# Patient Record
Sex: Male | Born: 1975 | Race: White | Hispanic: No | Smoking: Current every day smoker
Health system: Southern US, Community
[De-identification: ages and names within clinical notes are randomized; demographics above are authoritative.]

## PROBLEM LIST (undated history)

## (undated) DIAGNOSIS — S069XAA Unspecified intracranial injury with loss of consciousness status unknown, initial encounter: Secondary | ICD-10-CM

## (undated) DIAGNOSIS — E785 Hyperlipidemia, unspecified: Secondary | ICD-10-CM

## (undated) DIAGNOSIS — H9190 Unspecified hearing loss, unspecified ear: Secondary | ICD-10-CM

## (undated) DIAGNOSIS — E871 Hypo-osmolality and hyponatremia: Secondary | ICD-10-CM

## (undated) DIAGNOSIS — J449 Chronic obstructive pulmonary disease, unspecified: Secondary | ICD-10-CM

## (undated) HISTORY — DX: Hyperlipidemia, unspecified: E78.5

## (undated) HISTORY — DX: Unspecified intracranial injury with loss of consciousness status unknown, initial encounter: S06.9XAA

## (undated) HISTORY — DX: Hypo-osmolality and hyponatremia: E87.1

## (undated) HISTORY — DX: Unspecified hearing loss, unspecified ear: H91.90

## (undated) HISTORY — PX: DENTAL SURGERY: SHX609

---

## 1997-10-13 DIAGNOSIS — S069XAA Unspecified intracranial injury with loss of consciousness status unknown, initial encounter: Secondary | ICD-10-CM

## 1997-10-13 HISTORY — DX: Unspecified intracranial injury with loss of consciousness status unknown, initial encounter: S06.9XAA

## 2015-09-07 ENCOUNTER — Emergency Department (HOSPITAL_COMMUNITY)
Admission: EM | Admit: 2015-09-07 | Discharge: 2015-09-08 | Discharge status: Against Medical Advice | Payer: Self-pay | Attending: Emergency Medicine | Admitting: Emergency Medicine

## 2015-09-07 ENCOUNTER — Encounter (HOSPITAL_COMMUNITY): Payer: Self-pay | Admitting: Emergency Medicine

## 2015-09-07 ENCOUNTER — Emergency Department (HOSPITAL_COMMUNITY): Payer: Self-pay

## 2015-09-07 DIAGNOSIS — J449 Chronic obstructive pulmonary disease, unspecified: Secondary | ICD-10-CM | POA: Insufficient documentation

## 2015-09-07 DIAGNOSIS — F17200 Nicotine dependence, unspecified, uncomplicated: Secondary | ICD-10-CM | POA: Insufficient documentation

## 2015-09-07 DIAGNOSIS — R079 Chest pain, unspecified: Secondary | ICD-10-CM | POA: Insufficient documentation

## 2015-09-07 HISTORY — DX: Chronic obstructive pulmonary disease, unspecified: J44.9

## 2015-09-07 LAB — CBC
HCT: 42.4 % (ref 39.0–52.0)
HEMOGLOBIN: 15.7 g/dL (ref 13.0–17.0)
MCH: 33.5 pg (ref 26.0–34.0)
MCHC: 37 g/dL — AB (ref 30.0–36.0)
MCV: 90.4 fL (ref 78.0–100.0)
PLATELETS: 214 10*3/uL (ref 150–400)
RBC: 4.69 MIL/uL (ref 4.22–5.81)
RDW: 12.4 % (ref 11.5–15.5)
WBC: 9.2 10*3/uL (ref 4.0–10.5)

## 2015-09-07 LAB — BASIC METABOLIC PANEL
Anion gap: 10 (ref 5–15)
BUN: 12 mg/dL (ref 6–20)
CALCIUM: 9.4 mg/dL (ref 8.9–10.3)
CO2: 23 mmol/L (ref 22–32)
CREATININE: 1 mg/dL (ref 0.61–1.24)
Chloride: 102 mmol/L (ref 101–111)
GFR calc Af Amer: >60 mL/min (ref >60)
GFR calc non Af Amer: >60 mL/min (ref >60)
GLUCOSE: 124 mg/dL — AB (ref 65–99)
Potassium: 3.8 mmol/L (ref 3.5–5.1)
Sodium: 135 mmol/L (ref 135–145)

## 2015-09-07 LAB — I-STAT TROPONIN, ED: TROPONIN I, POC: 0 ng/mL (ref 0.00–0.08)

## 2015-09-07 NOTE — ED Notes (Signed)
Patient states that about 3 hours ago he started to have mid to right chest pain. The patient reports that he has an "annoying " pain to his chest and is more concerned because he felt like his chest is beating fast.

## 2015-09-08 NOTE — ED Notes (Signed)
Patient called to treatment room  X2, no answer. Patient d/c LWBS at this time.

## 2015-09-08 NOTE — ED Notes (Signed)
Patient called to treatment room, no answer x1.  

## 2016-07-10 IMAGING — CR DG CHEST 2V
2 series · 2 of 2 positions shown · non-contrast
Comparison: None.

CLINICAL DATA: Shortness of breath, weakness, tachycardia, and
dizziness for 1 day. Smoker.

EXAM:
CHEST  2 VIEW

[w chest pa]
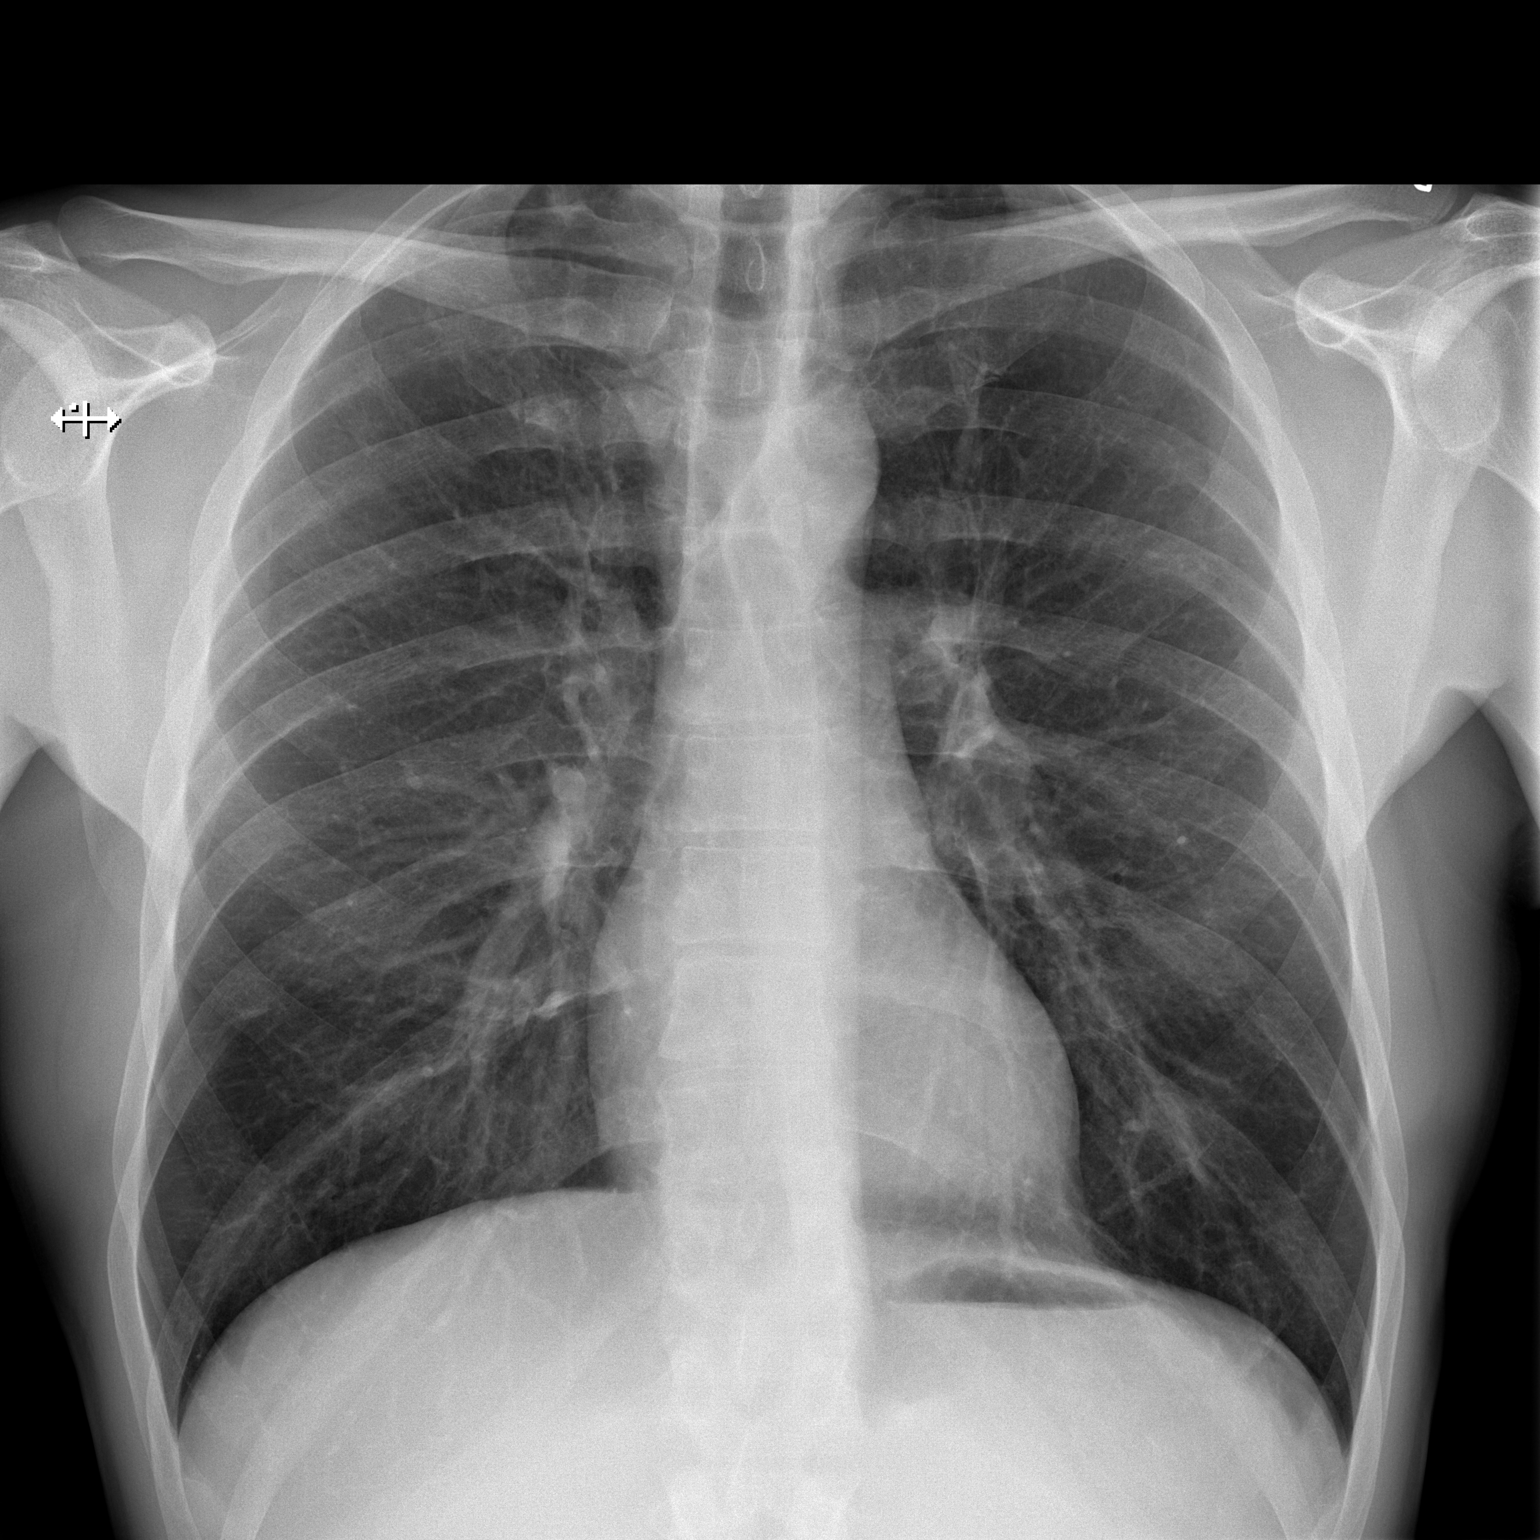

[w chest lat]
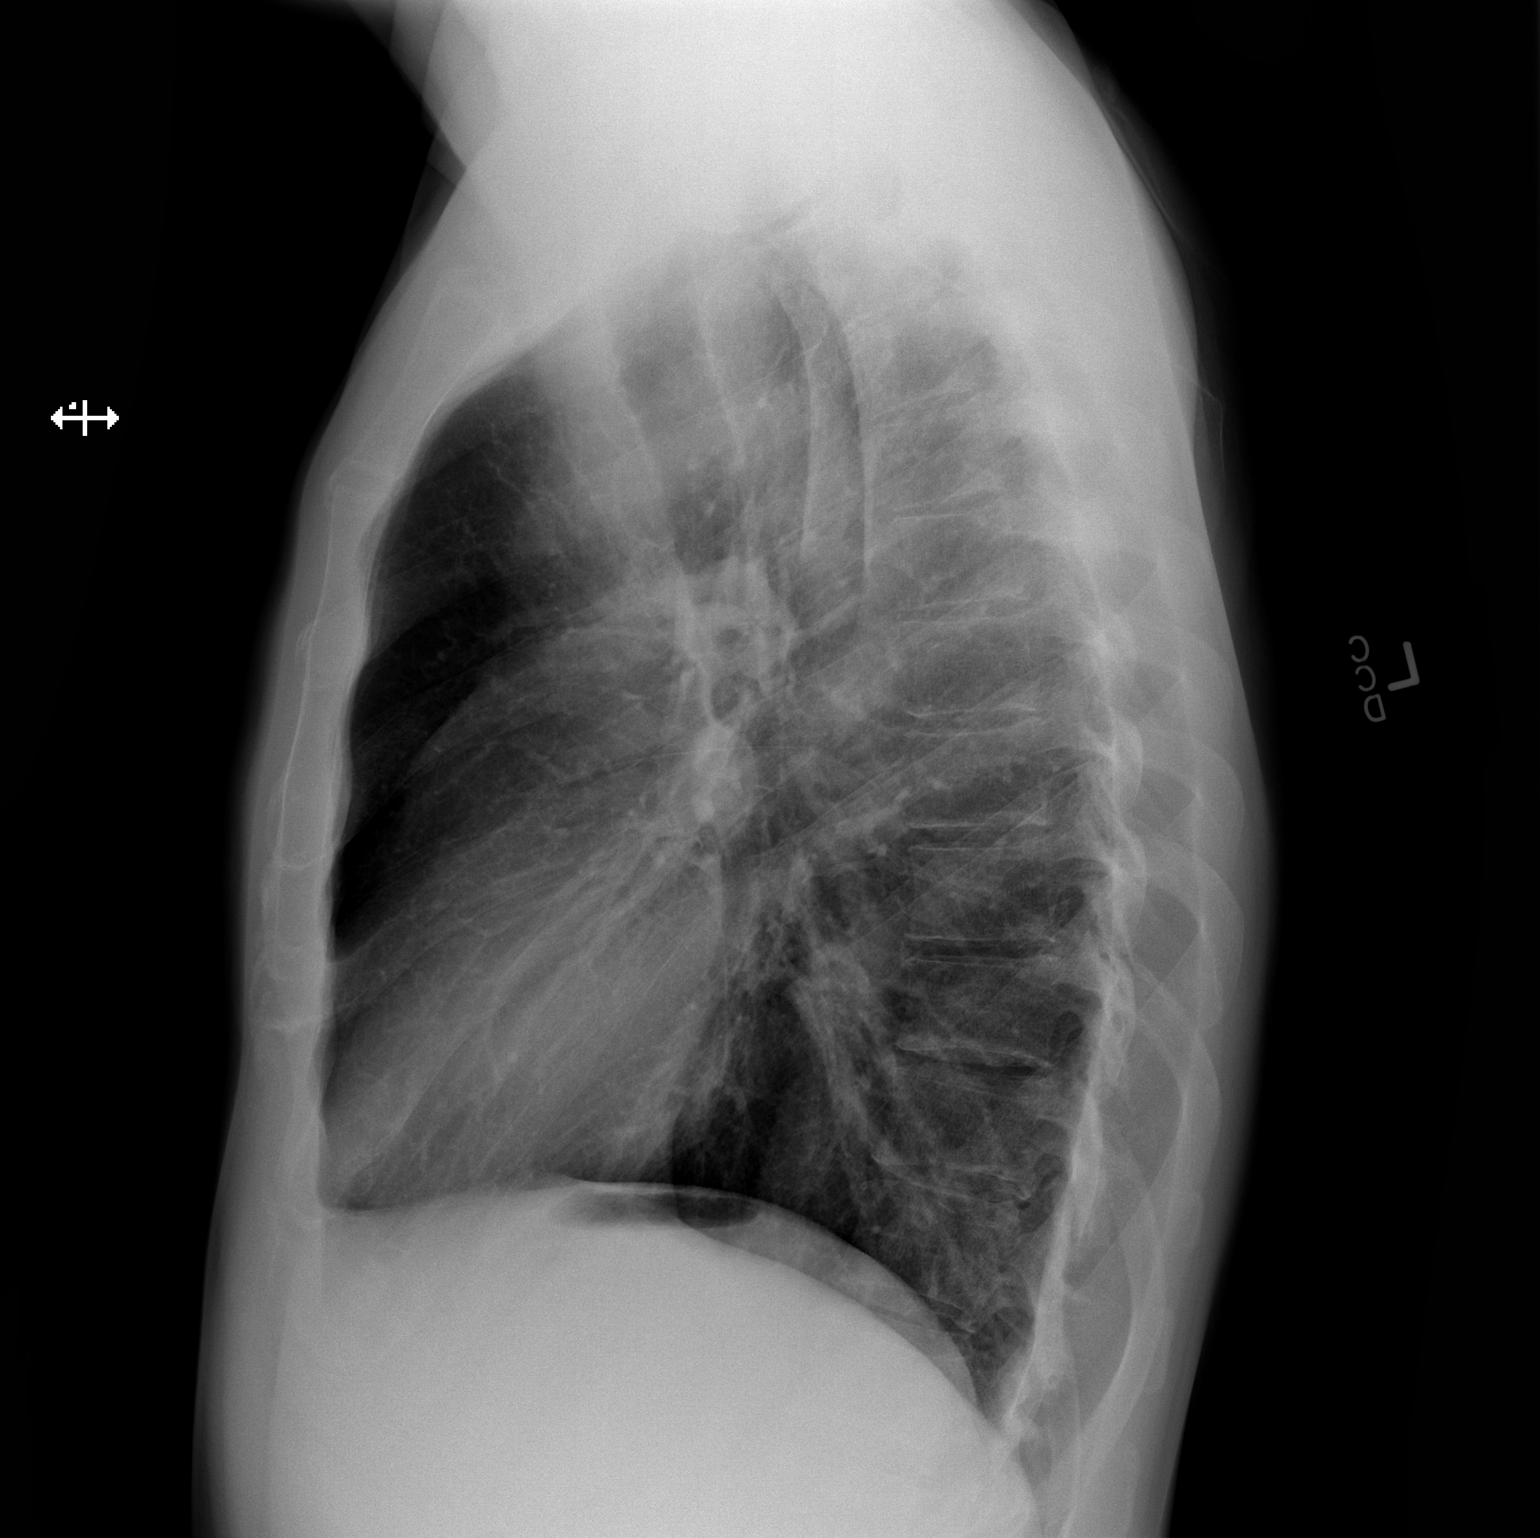

[2 of 2 positions shown; findings below may reference images not displayed]

FINDINGS: Mild hyperinflation. Normal heart size and pulmonary vascularity. No
focal airspace disease or consolidation in the lungs. No blunting of
costophrenic angles. No pneumothorax. Mediastinal contours appear
intact.
IMPRESSION: No active cardiopulmonary disease.

## 2021-09-12 DIAGNOSIS — U071 COVID-19: Secondary | ICD-10-CM

## 2021-09-12 HISTORY — DX: COVID-19: U07.1

## 2021-10-04 ENCOUNTER — Other Ambulatory Visit: Payer: Self-pay

## 2021-10-04 ENCOUNTER — Emergency Department: Payer: BC Managed Care – PPO

## 2021-10-04 ENCOUNTER — Observation Stay
Admission: EM | Admit: 2021-10-04 | Discharge: 2021-10-05 | Disposition: A | Discharge status: Home / Self Care | Payer: BC Managed Care – PPO | Attending: Student in an Organized Health Care Education/Training Program | Admitting: Student in an Organized Health Care Education/Training Program

## 2021-10-04 DIAGNOSIS — R9431 Abnormal electrocardiogram [ECG] [EKG]: Secondary | ICD-10-CM | POA: Insufficient documentation

## 2021-10-04 DIAGNOSIS — Z8249 Family history of ischemic heart disease and other diseases of the circulatory system: Secondary | ICD-10-CM | POA: Insufficient documentation

## 2021-10-04 DIAGNOSIS — Z79899 Other long term (current) drug therapy: Secondary | ICD-10-CM | POA: Insufficient documentation

## 2021-10-04 DIAGNOSIS — I259 Chronic ischemic heart disease, unspecified: Secondary | ICD-10-CM

## 2021-10-04 DIAGNOSIS — I2510 Atherosclerotic heart disease of native coronary artery without angina pectoris: Secondary | ICD-10-CM | POA: Insufficient documentation

## 2021-10-04 DIAGNOSIS — E871 Hypo-osmolality and hyponatremia: Secondary | ICD-10-CM | POA: Insufficient documentation

## 2021-10-04 DIAGNOSIS — Z7982 Long term (current) use of aspirin: Secondary | ICD-10-CM | POA: Insufficient documentation

## 2021-10-04 DIAGNOSIS — F17210 Nicotine dependence, cigarettes, uncomplicated: Secondary | ICD-10-CM | POA: Insufficient documentation

## 2021-10-04 DIAGNOSIS — R072 Precordial pain: Principal | ICD-10-CM | POA: Insufficient documentation

## 2021-10-04 DIAGNOSIS — R079 Chest pain, unspecified: Secondary | ICD-10-CM | POA: Diagnosis present

## 2021-10-04 DIAGNOSIS — U071 COVID-19: Secondary | ICD-10-CM | POA: Insufficient documentation

## 2021-10-04 LAB — CBC AND DIFFERENTIAL
Absolute NRBC: 0 10*3/uL (ref 0.00–0.00)
Basophils Absolute Automated: 0.07 10*3/uL (ref 0.00–0.08)
Basophils Automated: 1.1 %
Eosinophils Absolute Automated: 0.09 10*3/uL (ref 0.00–0.44)
Eosinophils Automated: 1.4 %
Hematocrit: 37.8 % (ref 37.6–49.6)
Hgb: 13.8 g/dL (ref 12.5–17.1)
Immature Granulocytes Absolute: 0.02 10*3/uL (ref 0.00–0.07)
Immature Granulocytes: 0.3 %
Lymphocytes Absolute Automated: 0.71 10*3/uL (ref 0.42–3.22)
Lymphocytes Automated: 11.4 %
MCH: 32.3 pg (ref 25.1–33.5)
MCHC: 36.5 g/dL — ABNORMAL HIGH (ref 31.5–35.8)
MCV: 88.5 fL (ref 78.0–96.0)
MPV: 8.4 fL — ABNORMAL LOW (ref 8.9–12.5)
Monocytes Absolute Automated: 0.54 10*3/uL (ref 0.21–0.85)
Monocytes: 8.6 %
Neutrophils Absolute: 4.82 10*3/uL (ref 1.10–6.33)
Neutrophils: 77.2 %
Nucleated RBC: 0 /100 WBC (ref 0.0–0.0)
Platelets: 183 10*3/uL (ref 142–346)
RBC: 4.27 10*6/uL (ref 4.20–5.90)
RDW: 12 % (ref 11–15)
WBC: 6.25 10*3/uL (ref 3.10–9.50)

## 2021-10-04 LAB — HIGH SENSITIVITY TROPONIN-I WITH DELTA
hs Troponin-I Delta: UNDETERMINED ng/L
hs Troponin-I: <2.7 ng/L

## 2021-10-04 LAB — ECG 12-LEAD
Atrial Rate: 80 {beats}/min
IHS MUSE NARRATIVE AND IMPRESSION: NORMAL
P Axis: 28 degrees
P-R Interval: 142 ms
Q-T Interval: 380 ms
QRS Duration: 110 ms
QTC Calculation (Bezet): 438 ms
R Axis: 73 degrees
T Axis: -16 degrees
Ventricular Rate: 80 {beats}/min

## 2021-10-04 LAB — COVID-19 (SARS-COV-2) & INFLUENZA  A/B, NAA (ROCHE LIAT)
Influenza A: NOT DETECTED
Influenza B: NOT DETECTED
SARS CoV 2 Overall Result: DETECTED — AB

## 2021-10-04 LAB — COMPREHENSIVE METABOLIC PANEL
ALT: 13 U/L (ref 0–55)
AST (SGOT): 19 U/L (ref 5–41)
Albumin/Globulin Ratio: 1.3 (ref 0.9–2.2)
Albumin: 4 g/dL (ref 3.5–5.0)
Alkaline Phosphatase: 61 U/L (ref 37–117)
Anion Gap: 12 (ref 5.0–15.0)
BUN: 5 mg/dL — ABNORMAL LOW (ref 9.0–28.0)
Bilirubin, Total: 0.4 mg/dL (ref 0.2–1.2)
CO2: 22 mEq/L (ref 17–29)
Calcium: 9 mg/dL (ref 8.5–10.5)
Chloride: 90 mEq/L — ABNORMAL LOW (ref 99–111)
Creatinine: 0.9 mg/dL (ref 0.5–1.5)
Globulin: 3 g/dL (ref 2.0–3.6)
Glucose: 86 mg/dL (ref 70–100)
Potassium: 4.3 mEq/L (ref 3.5–5.3)
Protein, Total: 7 g/dL (ref 6.0–8.3)
Sodium: 124 mEq/L — ABNORMAL LOW (ref 135–145)

## 2021-10-04 LAB — CBC
Absolute NRBC: 0 10*3/uL (ref 0.00–0.00)
Hematocrit: 34.8 % — ABNORMAL LOW (ref 37.6–49.6)
Hgb: 12.8 g/dL (ref 12.5–17.1)
MCH: 32.2 pg (ref 25.1–33.5)
MCHC: 36.8 g/dL — ABNORMAL HIGH (ref 31.5–35.8)
MCV: 87.7 fL (ref 78.0–96.0)
MPV: 8.4 fL — ABNORMAL LOW (ref 8.9–12.5)
Nucleated RBC: 0 /100 WBC (ref 0.0–0.0)
Platelets: 172 10*3/uL (ref 142–346)
RBC: 3.97 10*6/uL — ABNORMAL LOW (ref 4.20–5.90)
RDW: 12 % (ref 11–15)
WBC: 5.92 10*3/uL (ref 3.10–9.50)

## 2021-10-04 LAB — ANTI-XA,UFH
Anti-Xa, UFH: 0.09 IU/mL
Anti-Xa, UFH: 0.32 IU/mL

## 2021-10-04 LAB — APTT: PTT: 30 s (ref 27–39)

## 2021-10-04 LAB — GFR: EGFR: >60

## 2021-10-04 LAB — HIGH SENSITIVITY TROPONIN-I: hs Troponin-I: <2.7 ng/L

## 2021-10-04 LAB — IHS D-DIMER: D-Dimer: 0.9 ug/mL FEU — ABNORMAL HIGH (ref 0.00–0.60)

## 2021-10-04 MED ORDER — DEXTROSE 50 % IV SOLN
12.5000 g | INTRAVENOUS | Status: DC | PRN
Start: 2021-10-04 — End: 2021-10-05

## 2021-10-04 MED ORDER — HEPARIN (PORCINE) IN D5W 50-5 UNIT/ML-% IV SOLN (UNITS/KG/HR ONLY)
12.0000 [IU]/kg/h | INTRAVENOUS | Status: DC
Start: 2021-10-04 — End: 2021-10-04
  Filled 2021-10-04: qty 500

## 2021-10-04 MED ORDER — NITROGLYCERIN 0.4 MG SL SUBL
0.4000 mg | SUBLINGUAL_TABLET | SUBLINGUAL | Status: DC | PRN
Start: 2021-10-04 — End: 2021-10-05

## 2021-10-04 MED ORDER — GLUCAGON 1 MG IJ SOLR (WRAP)
1.0000 mg | INTRAMUSCULAR | Status: DC | PRN
Start: 2021-10-04 — End: 2021-10-05

## 2021-10-04 MED ORDER — IOHEXOL 350 MG/ML IV SOLN
100.0000 mL | Freq: Once | INTRAVENOUS | Status: AC | PRN
Start: 2021-10-04 — End: 2021-10-04
  Administered 2021-10-04: 19:00:00 100 mL via INTRAVENOUS

## 2021-10-04 MED ORDER — NITROGLYCERIN IN D5W 200-5 MCG/ML-% IV SOLN
20.0000 ug/min | INTRAVENOUS | Status: DC
Start: 2021-10-04 — End: 2021-10-05
  Administered 2021-10-04: 21:00:00 50 ug/min via INTRAVENOUS
  Administered 2021-10-04: 18:00:00 20 ug/min via INTRAVENOUS
  Filled 2021-10-04: qty 250

## 2021-10-04 MED ORDER — POTASSIUM CHLORIDE 10 MEQ/100ML IV SOLN
10.0000 meq | INTRAVENOUS | Status: DC | PRN
Start: 2021-10-04 — End: 2021-10-05

## 2021-10-04 MED ORDER — HEPARIN SODIUM (PORCINE) 5000 UNIT/ML IJ SOLN
4000.0000 [IU] | Freq: Once | INTRAMUSCULAR | Status: DC
Start: 2021-10-04 — End: 2021-10-04
  Filled 2021-10-04: qty 1

## 2021-10-04 MED ORDER — HEPARIN SODIUM (PORCINE) 5000 UNIT/ML IJ SOLN
4000.0000 [IU] | Freq: Once | INTRAMUSCULAR | Status: AC
Start: 2021-10-04 — End: 2021-10-04
  Administered 2021-10-04: 18:00:00 4000 [IU] via INTRAVENOUS

## 2021-10-04 MED ORDER — HEPARIN SODIUM (PORCINE) 5000 UNIT/ML IJ SOLN
3000.0000 [IU] | INTRAMUSCULAR | Status: DC | PRN
Start: 2021-10-04 — End: 2021-10-04

## 2021-10-04 MED ORDER — ACETAMINOPHEN 650 MG RE SUPP
650.0000 mg | Freq: Four times a day (QID) | RECTAL | Status: DC | PRN
Start: 2021-10-04 — End: 2021-10-05

## 2021-10-04 MED ORDER — DEXTROSE 10 % IV BOLUS
12.5000 g | INTRAVENOUS | Status: DC | PRN
Start: 2021-10-04 — End: 2021-10-05

## 2021-10-04 MED ORDER — POTASSIUM CHLORIDE CRYS ER 20 MEQ PO TBCR
0.0000 meq | EXTENDED_RELEASE_TABLET | ORAL | Status: DC | PRN
Start: 2021-10-04 — End: 2021-10-05
  Administered 2021-10-05: 06:00:00 40 meq via ORAL
  Filled 2021-10-04: qty 2

## 2021-10-04 MED ORDER — NALOXONE HCL 0.4 MG/ML IJ SOLN (WRAP)
0.2000 mg | INTRAMUSCULAR | Status: DC | PRN
Start: 2021-10-04 — End: 2021-10-05

## 2021-10-04 MED ORDER — MAGNESIUM SULFATE IN D5W 1-5 GM/100ML-% IV SOLN
1.0000 g | INTRAVENOUS | Status: DC | PRN
Start: 2021-10-04 — End: 2021-10-05

## 2021-10-04 MED ORDER — GLUCOSE 40 % PO GEL (WRAP)
15.0000 g | ORAL | Status: DC | PRN
Start: 2021-10-04 — End: 2021-10-05

## 2021-10-04 MED ORDER — POTASSIUM & SODIUM PHOSPHATES 280-160-250 MG PO PACK
2.0000 | PACK | ORAL | Status: DC | PRN
Start: 2021-10-04 — End: 2021-10-05

## 2021-10-04 MED ORDER — HEPARIN SODIUM (PORCINE) 5000 UNIT/ML IJ SOLN
3000.0000 [IU] | INTRAMUSCULAR | Status: DC | PRN
Start: 2021-10-04 — End: 2021-10-05

## 2021-10-04 MED ORDER — MELATONIN 3 MG PO TABS
3.0000 mg | ORAL_TABLET | Freq: Every evening | ORAL | Status: DC | PRN
Start: 2021-10-04 — End: 2021-10-05

## 2021-10-04 MED ORDER — ACETAMINOPHEN 500 MG PO TABS
1000.0000 mg | ORAL_TABLET | Freq: Once | ORAL | Status: AC
Start: 2021-10-04 — End: 2021-10-04
  Administered 2021-10-04: 20:00:00 1000 mg via ORAL
  Filled 2021-10-04: qty 2

## 2021-10-04 MED ORDER — ONDANSETRON HCL 4 MG/2ML IJ SOLN
4.0000 mg | Freq: Four times a day (QID) | INTRAMUSCULAR | Status: DC | PRN
Start: 2021-10-04 — End: 2021-10-05

## 2021-10-04 MED ORDER — ONDANSETRON 4 MG PO TBDP
4.0000 mg | ORAL_TABLET | Freq: Four times a day (QID) | ORAL | Status: DC | PRN
Start: 2021-10-04 — End: 2021-10-05

## 2021-10-04 MED ORDER — HEPARIN (PORCINE) IN D5W 50-5 UNIT/ML-% IV SOLN (UNITS/KG/HR ONLY)
11.3000 [IU]/kg/h | INTRAVENOUS | Status: DC
Start: 2021-10-04 — End: 2021-10-05
  Administered 2021-10-04 (×2): 11.3 [IU]/kg/h via INTRAVENOUS

## 2021-10-04 MED ORDER — ACETAMINOPHEN 325 MG PO TABS
650.0000 mg | ORAL_TABLET | Freq: Four times a day (QID) | ORAL | Status: DC | PRN
Start: 2021-10-04 — End: 2021-10-05
  Administered 2021-10-05: 650 mg via ORAL
  Filled 2021-10-04: qty 2

## 2021-10-04 MED ORDER — MORPHINE SULFATE 4 MG/ML IJ/IV SOLN (WRAP)
4.0000 mg | Freq: Once | Status: AC
Start: 2021-10-04 — End: 2021-10-04
  Administered 2021-10-04: 18:00:00 4 mg via INTRAVENOUS
  Filled 2021-10-04: qty 1

## 2021-10-04 NOTE — ED Notes (Signed)
[facility] EMERGENCY DEPT  ED NURSING NOTE FOR THE RECEIVING INPATIENT NURSE   ED NURSE Elke Holtry/Jessica   [place] [address]   ED CHARGE RN [phone]   ADMISSION INFORMATION   Neilson Oehlert is a 45 y.o. male admitted with an ED diagnosis of:    1. Chest pain         Isolation: None   Allergies: Patient has no known allergies.   Holding Orders confirmed? No   Belongings Documented? No   Home medications sent to pharmacy confirmed? N/A   NURSING CARE   Patient Comes From:   Mental Status: Home Independent  alert and oriented   ADL: Needs assistance with ADLs   Ambulation: mild difficulty   Pertinent Information  and Safety Concerns:     Broset Violence Risk Level: Low Patient complains of chest pain starting at 4AM today with slight hypertension.  Patient had neg trops x 2, but had ekg depressions.  Patient started on nitro drip and heparin.  Anti X A due at midnight.  Pain currently 2/10.   CT / NIH   CT Head ordered on this patient?  No   NIH/Dysphagia assessment done prior to admission? No   VITAL SIGNS (at the time of this note)      Vitals:    10/04/21 2006   BP: 114/69   Pulse: 73   Resp: 17   Temp: 98 F (36.7 C)   SpO2: 97%

## 2021-10-04 NOTE — ED Provider Notes (Addendum)
[facility] EMERGENCY DEPARTMENT  ATTENDING PHYSICIAN HISTORY AND PHYSICAL EXAM     Patient Name: Erik [name], Erik [name]  Encounter Date:  10/04/2021  Attending Physician: Inetta Fermo, MD  Room:  S 23/S 23  Patient DOB:  January 16, 1976  Age: 45 y.o. male  MRN:  [id number]  PCP: Marisa Sprinkles, MD         Diagnosis/Disposition:     Final Impression  Final diagnoses:   Chest pain     Disposition  ED Disposition       ED Disposition   Admit    Condition   --    Date/Time   Fri Oct 04, 2021  8:04 PM    Comment   Admitting Physician: Corky Sing [phone]   Service:: Medicine [106]   Estimated Length of Stay: > or = to 2 midnights   Tentative Discharge Plan?: Home or Self Care [1]   Does patient need telemetry?: Yes   Telemetry type (separate Telemetry order is also required):: Cardiac telemetry               Follow up  No follow-up provider specified.  Prescriptions  New Prescriptions    No medications on file             MDM:      Seen with LIP - 45 y/o M no pmhx woke up with chest pain and pressure, feels like elephant sitting on chest. BP 160s. Took 5x baby ASA prior to arrival. Having chest pressure, SOB. No cough, cold like sx. No risk factors for PE, trauma    Ddx includes ACS, PE, dissection, PNA, viral syndrome. Patient with L chest pain radiating to L arm since waking this AM. Also in back but not going through, no tearing sensation. Radial pulses equal b/l, BP stable. ECG with depressions in II, III, avf, v5-V6 no elevations but concerning for ischemia.     ED Course as of 10/04/21 2019   Fri Oct 04, 2021   1708 CBC and differential(!)  Unremarkable [CW]   1708 XR Chest 2 Views  FINDINGS:   Cardiomediastinal silhouette is normal. No pulmonary edema. No focal  consolidation, pleural effusion or pneumothorax.     IMPRESSION:      No acute intrathoracic process. [CW]   1718 Comprehensive metabolic panel(!)  Mild hyponatremia, hypochloremia [CW]   1733 hs Troponin-I: <2.7 [AZ]   1752 Repeat ECG unchanged but seill with ST dep  II, III, AVF and V5/V6. Elevationin V2. Calling interventionalist [AZ]   1800 Spoke to interventional cardiologists Dr. Elder Love and Dr. Cresenciano Lick, who did not believe it was necessary to activate the cath lab with ST depressions without reciprocal ST elevations, in the setting of negative troponin. Recommend to repeat troponin at the 2 hour mark, start heparin GTT and nitro GTT. Plan for cath lab tomorrow, but will re-evaluate if patient continues to experience pain and/or troponin changes. [CW]   1901 SARS CoV 2 Overall Result(!): Detected [AZ]   1933 Extensive cardiac disease on father's side - dad, grandfather, great grandfather died in 62s of cardiac disease [AZ]       1956 hs Troponin-I: <2.7 [CW]   2008 CTA chest no dissection or large PE noted. COVID positive. Trop negative x 2. On nitro and heparin gtts per interventional cardiology, [facility] to see tomorrow. Admitted to medicine [AZ]      ED Course User Index  [AZ] Bosie Clos, Loleta Rose, MD  [CW] Cheryl Flash, MD  The patient's past medical records, including those in Care Everywhere when necessary, were reviewed by me.          History of Presenting Illness:     Nursing Triage note: Center chest pain since waking up this morning. States "I kept thinking it was gas and I keep trying to burp it up but I can't"    Chief complaint: Chest Pain      HPI  Erik [name] is a 45 y.o. male w/ no past medical history on record presents with moderate persistent chest pain since 4 AM today.  Patient describes chest pain as an "elephant sitting on his chest".  Took 5 tablets of 81 mg baby aspirin with little improvement.  He took his pressure at the pharmacy, which was 160 systolic.  No PMHx of hypertension.  Endorses slight shortness of breath.  Reports mild headache, left arm pain, and tingling to left fingertips.  No altered mental status.    No recent travel, calf pain, recent trauma, or history of DVT.        Review of Systems:  Physical Exam:     Review of  Systems    All other systems reviewed and negative except what is specifically documented above.     Pulse 97   BP 139/83   Resp 17   SpO2 100 %   Temp 98.4 F (36.9 C)     Physical Exam  Nursing notes and vital signs reviewed    General: Patient in no acute distress, overall appears uncomfortable and nontoxic.   Head: Normocephalic, atraumatic  Neck: supple  Eyes: No scleral icterus  Ears/Nose/Throat: Moist mucous membranes, airway is patent, no facial swelling  Cardiovascular: Regular rate and rhythm with no murmurs/rubs appreciated, no LE swelling  Pulmonary: Normal respiratory effort, CTAB w/o wheezes/crackles  GI: Abdomen is soft, non-distended, and non-tender.  Peritoneal signs are absent.  Back: No gross deformities.  Musculoskeletal: No gross injuries or deformities. No leg swelling or tenderness.  Neurological: Alert and oriented x3, face symmetric, hearing intact to voice, speech normal, no gross deficits  Psych: Behaving appropriately  Skin: Warm, dry, no rashes       Diagnostic Results:     Laboratory Studies:    All lab values have been personally reviewed by me    Results       Procedure Component Value Units Date/Time    High Sensitivity Troponin-I with calculated Delta [[id number]] Collected: 10/04/21 1917    Specimen: Blood Updated: 10/04/21 1955     hs Troponin-I <2.7 ng/L      hs Troponin-I Delta Unable toCalc. ng/L     APTT [[id number]] Collected: 10/04/21 1829     Updated: 10/04/21 1852     PTT 30 sec     Narrative:      Obtain baseline aPTT prior to heparin initiation if not drawn  previously, to evaluate for underlying coagulopathy or  recent non-heparin anticoagulant. exposure    Anti-Xa, UFH [[id number]] Collected: 10/04/21 1829     Updated: 10/04/21 1852     Anti-Xa, UFH 0.09 IU/mL     Narrative:      Obtain baseline aPTT prior to heparin initiation if not drawn  previously, to evaluate for underlying coagulopathy or  recent non-heparin anticoagulant. exposure    CBC without differential  [[id number]]  (Abnormal) Collected: 10/04/21 1829    Specimen: Blood Updated: 10/04/21 1845     WBC 5.92 x10 3/uL      Hgb 12.8  g/dL      Hematocrit 54.0 %      Platelets 172 x10 3/uL      RBC 3.97 x10 6/uL      MCV 87.7 fL      MCH 32.2 pg      MCHC 36.8 g/dL      RDW 12 %      MPV 8.4 fL      Nucleated RBC 0.0 /100 WBC      Absolute NRBC 0.00 x10 3/uL     Narrative:      Obtain baseline aPTT prior to heparin initiation if not drawn  previously, to evaluate for underlying coagulopathy or  recent non-heparin anticoagulant. exposure    COVID-19 (SARS-CoV-2) and Influenza A/B, NAA ([facility])- Admission [[id number]]  (Abnormal) Collected: 10/04/21 1753    Specimen: Culturette from Nasopharyngeal Updated: 10/04/21 1835     Purpose of COVID testing Diagnostic -[facility]     SARS-CoV-2 Specimen Source Nasal Swab     SARS CoV 2 Overall Result Detected     Influenza A Not Detected     Influenza B Not Detected    Narrative:      o Collect and clearly label specimen type:  o PREFERRED-Upper respiratory specimen: One Nasal Swab in  Transport Media.  o Hand deliver to laboratory ASAP  Diagnostic -[facility]    D-Dimer [[id number]]  (Abnormal) Collected: 10/04/21 1753     Updated: 10/04/21 1823     D-Dimer 0.90 ug/mL FEU     High Sensitivity Troponin-I [[id number]] Collected: 10/04/21 1648    Specimen: Blood Updated: 10/04/21 1724     hs Troponin-I <2.7 ng/L     Comprehensive metabolic panel [[id number]]  (Abnormal) Collected: 10/04/21 1648    Specimen: Blood Updated: 10/04/21 1718     Glucose 86 mg/dL      BUN 5.0 mg/dL      Creatinine 0.9 mg/dL      Sodium 401 mEq/L      Potassium 4.3 mEq/L      Chloride 90 mEq/L      CO2 22 mEq/L      Calcium 9.0 mg/dL      Protein, Total 7.0 g/dL      Albumin 4.0 g/dL      AST (SGOT) 19 U/L      ALT 13 U/L      Alkaline Phosphatase 61 U/L      Bilirubin, Total 0.4 mg/dL      Globulin 3.0 g/dL      Albumin/Globulin Ratio 1.3     Anion Gap 12.0    GFR [[id number]] Collected: 10/04/21 1648     Updated: 10/04/21  1718     EGFR >60.0       CBC and differential [[id number]]  (Abnormal) Collected: 10/04/21 1648    Specimen: Blood Updated: 10/04/21 1707     WBC 6.25 x10 3/uL      Hgb 13.8 g/dL      Hematocrit 56.3 %      Platelets 183 x10 3/uL      RBC 4.27 x10 6/uL      MCV 88.5 fL      MCH 32.3 pg      MCHC 36.5 g/dL      RDW 12 %      MPV 8.4 fL      Neutrophils 77.2 %      Lymphocytes Automated 11.4 %      Monocytes 8.6 %      Eosinophils  Automated 1.4 %      Basophils Automated 1.1 %      Immature Granulocytes 0.3 %      Nucleated RBC 0.0 /100 WBC      Neutrophils Absolute 4.82 x10 3/uL      Lymphocytes Absolute Automated 0.71 x10 3/uL      Monocytes Absolute Automated 0.54 x10 3/uL      Eosinophils Absolute Automated 0.09 x10 3/uL      Basophils Absolute Automated 0.07 x10 3/uL      Immature Granulocytes Absolute 0.02 x10 3/uL      Absolute NRBC 0.00 x10 3/uL             Radiology Studies:    All images have been personally viewed by me    CT Angiogram AAA Chest Abdomen (R/O Dissection)   Final Result         1. No aortic dissection or central pulmonary embolism.   2. Coronary artery calcifications.      Shelly Flatten, MD    10/04/2021 7:42 PM      XR Chest 2 Views   Final Result      No acute intrathoracic process.      Shelly Flatten, MD    10/04/2021 5:04 PM              Interpretations, Clinical Decision Tools and Critical Care:     O2 Sat-           saturation: 100 %; Oxygen use: room air; Interpretation: Normal    EKG -             interpreted by me: normal sinus at 76.  ST depression in leads II, III, and aVF.  No ST depression in V5 and V6.  T wave inversion in after mentioned leads.  Minimal ST elevation in V2 with no contiguous elevation.  QRS borderline at 108.  No STEMI.  Posterior EKG-interpreted by me: Normal sinus rhythm at 80.  No posterior elevation.  No STEMI.  QRS 110.  Monitor -         interpreted by me: normal sinus at 90s.       Critical Care Time(not including procedures): 45 minutes.  Due to the  high risk of critical illness or multi-organ failure at initial presentation and/or during ED course.   System(s) at risk for compromise:  circulatory and respiratory  Critical Diagnosis:   1. Chest pain       The patient was Hypotensive:   No    The patient was Hypoxic:   No    This does not including time spent performing other reported procedures or services.  Critical care time involved full attention to the patient's condition and included:   Review of nursing notes and/or old charts - Yes  Documentation time - Yes  Care, transfer of care, and discharge plans - Yes  Obtaining necessary history from family, EMS, nursing home staff and/or treating physicians - Yes  Review of medications, allergies, and vital signs - Yes   Consultant collaboration on findings and treatment options - Yes  Ordering, interpreting, and reviewing diagnostic studies/tab tests - Yes           Procedures:   Procedures        Orders Placed During This Visit:     Encounter Orders:  Orders Placed This Encounter   Procedures    COVID-19 (SARS-CoV-2) and Influenza A/B, NAA ([facility])- Admission    XR Chest 2  Views    CT Angiogram AAA Chest Abdomen (R/O Dissection)    CBC and differential    Comprehensive metabolic panel    High Sensitivity Troponin-I    GFR    D-Dimer    High Sensitivity Troponin-I with calculated Delta    APTT    Anti-Xa, UFH    Anti-Xa, UFH    CBC without differential    CBC without differential    Notify physician If 2 successive anti-Xa <0.1 IU/mL    Notify physician (specify) Signs/Symptoms of Bleeding    Notify physician (specify) Allergic to Heparin/Pork Products    Assess if patient received anticoag last 12 hour    Apply Anticoagulation Arm Band    Education: Anticoagulation    Education: Heparin    Notify physician (specify) Signs/Symptoms of Bleeding    Inpatient consult to cardiology    ECG 12 Lead    ECG 12 Lead    ECG - Posterior    ECG 12 lead    ECG 12 Lead    Adult Admit to Inpatient ([facility] Only)    BLEEDING  PRECAUTIONS       Encounter Medications:  Medications   nitroglycerin (TRIDIL) 200 mcg/mL in dextrose 5% 250 mL infusion (50 mcg/min Intravenous Rate/Dose Change 10/04/21 1838)   heparin 25,000 units in dextrose 5% 500 mL infusion (premix) (11.3 Units/kg/hr  88.8 kg Intravenous New Bag 10/04/21 1826)   heparin (porcine) injection 3,000 Units (has no administration in time range)   heparin (porcine) injection 4,000 Units (4,000 Units Intravenous Given 10/04/21 1824)   morphine injection 4 mg (4 mg Intravenous Given 10/04/21 1827)   iohexol (OMNIPAQUE) 350 MG/ML injection 100 mL (100 mLs Intravenous Imaging Agent Given 10/04/21 1910)   acetaminophen (TYLENOL) tablet 1,000 mg (1,000 mg Oral Given 10/04/21 2007)           Allergies & Medications:     Allergies:  Hehas No Known Allergies.    Home Medications       Med List Status: In Progress Set By: Philomena Course, RN at 10/04/2021  5:03 PM      No Medications                 Past History:     Medical: History reviewed. No pertinent past medical history.    Surgical: He has no past surgical history on file.    Family: History reviewed. No pertinent family history.    Social: He reports that he has been smoking cigarettes. He does not have any smokeless tobacco history on file. No history on file for alcohol use and drug use.        ATTESTATIONS     Inetta Fermo, MD    Scribe Attestation:    I am the first provider for this patient and I personally performed the services documented. Llana Aliment is scribing for me on this chart. This note and the patient instructions accurately reflect work and decisions made by me.      Documentation Notes:    Parts of this note were generated by the Epic EMR system/ Dragon speech recognition and may contain inherent errors or omissions not intended by the user. Grammatical errors, random word insertions, deletions, pronoun errors and incomplete sentences are occasional consequences of this technology due to software limitations. Not  all errors are caught or corrected.    My documentation is often completed after the patient is no longer under my clinical care. In some cases, the Epic EMR  may pull updated results into the above documentation which may not reflect all results or information that was available to me at the time of my medical decision making.     If there are questions or concerns about the content of this note or information contained within the body of this dictation they should be addressed directly with the author for clarification.                    Tiajuana Amass, MD  10/04/21 2019       Tiajuana Amass, MD  10/04/21 2020

## 2021-10-04 NOTE — H&P (Signed)
ADMISSION HISTORY AND PHYSICAL EXAM    Date Time: 10/04/21 11:41 PM  Patient Name: Erik [name]  Attending Physician: Jules Schick, MD  Primary Care Physician: Pcp, None, MD    CC: Chest pain x1 day    Assessment/Plan:   This patient is a 45 year old male with a past medical history significant for tobacco smoking who presented with substernal chest pain and intermittent shortness of breath since a day ago.  Patient reports  strong family history of cardiac disease,  his father died of heart attack in his 40s.    Assessment:  1) Chest pain  2) Abnormal EKG, ST depression in leads II, III, aVF, V5 and V6  3) COVID-19    Plan  - Admit patient to medicine with telemetry  - Obtain serial troponins, TSH, lipid panel, and hemoglobin A1c  - Electrocardiogram and echocardiogram  - Started on IV heparin and nitroglycerin at the ED  - Interventional radiologist was consulted and they have a plan to do catheterization in a.m.  - Supplemental oxygen via nasal cannula as needed     CODE: Full code  DVT: Heparin  Foley: Not indicated  Diet: Heart healthy diet      History of Presenting Illness:   Erik [name] is a 45 year old male with a past medical history significant for tobacco smoking who presented with substernal chest pain and intermittent shortness of breath since a day ago.  Patient reports the chest pain woke him up from sleep.  He denies palpitations, cough, fever, chills, lower extremity swelling, abdominal pain, vomiting, diarrhea, urinary frequency, flank pain, or dysuria.  He denies headache, loss of consciousness, dizziness or slurred speech. Patient reports  strong family history of cardiac disease,  his father died of heart attack in his 42s.  The emergency department patient was found to have EKG with ST depressions in lead II, III, aVF, V5 and V6. Delta troponin negative. No wall motion abnormality on bedside echo. CT angio negative for dissection and central PE.  ED physician spoke to  interventional cardiology who plans to cath him t today. He is currently on nitro and heparin drips.  Patient tested positive for COVID-19.  Patient is not vaccinated against COVID-19.    Past Medical History:   History reviewed. No pertinent past medical history.    Available old records reviewed, including:  Epic    Past Surgical History:   History reviewed. No pertinent surgical history.    Family History:   History reviewed. No pertinent family history.    Social History:     Social History     Tobacco Use   Smoking Status Every Day    Types: Cigarettes   Smokeless Tobacco Not on file     Social History     Substance and Sexual Activity   Alcohol Use None     Social History     Substance and Sexual Activity   Drug Use Not on file       Allergies:   No Known Allergies    Medications:     Home Medications       Med List Status: In Progress Set By: Philomena Course, RN at 10/04/2021  5:03 PM      No Medications            Method by which medications were confirmed on admission: with patient and EMR.    Review of Systems:   All other systems were reviewed and are negative except those mentioned  in HPI.    Physical Exam:   Patient Vitals for the past 24 hrs:   BP Temp Temp src Pulse Resp SpO2 Height Weight   10/04/21 2300 101/59 98.1 F (36.7 C) Oral 68 17 95 % -- --   10/04/21 2200 107/65 -- -- 68 -- 96 % -- --   10/04/21 2142 107/58 -- -- 68 12 95 % -- --   10/04/21 2100 108/68 -- -- 69 12 96 % -- --   10/04/21 2050 114/69 -- -- 67 17 97 % -- --   10/04/21 2006 114/69 98 F (36.7 C) -- 73 17 97 % -- --   10/04/21 1959 110/67 -- -- 72 15 96 % -- --   10/04/21 1946 114/74 -- -- 78 19 96 % -- --   10/04/21 1937 110/68 -- -- 79 18 96 % -- --   10/04/21 1838 -- -- -- 82 -- -- -- --   10/04/21 1825 143/78 -- -- 80 14 97 % -- --   10/04/21 1814 147/81 -- -- 75 -- -- -- --   10/04/21 1811 -- -- -- -- -- -- -- 88.8 kg (195 lb 12.3 oz)   10/04/21 1810 147/81 -- -- 80 12 98 % -- --   10/04/21 1630 139/83 98.4 F (36.9 C)  Oral 94 17 98 % 1.88 m (6\' 2" ) 79.4 kg (175 lb)   10/04/21 1623 -- -- -- 97 -- 100 % -- --     Body mass index is 25.14 kg/m.  No intake or output data in the 24 hours ending 10/04/21 2341    General: awake, alert, oriented x 3; no acute distress.  HEENT: perrla, eomi, sclera anicteric  oropharynx clear without lesions, mucous membranes moist  Neck: supple, no lymphadenopathy, no thyromegaly, no JVD, no carotid bruits  Cardiovascular: regular rate and rhythm, no murmurs, rubs or gallops  Lungs: clear to auscultation bilaterally, without wheezing, rhonchi, or rales  Abdomen: soft, non-tender, non-distended; no palpable masses, no hepatosplenomegaly, normoactive bowel sounds, no rebound or guarding  Extremities: no clubbing, cyanosis, or edema  Neuro: cranial nerves grossly intact, strength 5/5 in upper and lower extremities, sensation intact,   Skin: no rashes or lesions noted       Labs:     Results       Procedure Component Value Units Date/Time    Anti-Xa, UFH [[id number]] Collected: 10/04/21 2216     Updated: 10/04/21 2254     Anti-Xa, UFH 0.32 IU/mL     Narrative:      Every 8 hours after initiation and every rate change until  two subsequent values within goal range, then change  frequency to IHS daily at 0400 until heparin is discontinued.    High Sensitivity Troponin-I with calculated Delta [[id number]] Collected: 10/04/21 1917    Specimen: Blood Updated: 10/04/21 1955     hs Troponin-I <2.7 ng/L      hs Troponin-I Delta Unable toCalc. ng/L     APTT [[id number]] Collected: 10/04/21 1829     Updated: 10/04/21 1852     PTT 30 sec     Narrative:      Obtain baseline aPTT prior to heparin initiation if not drawn  previously, to evaluate for underlying coagulopathy or  recent non-heparin anticoagulant. exposure    Anti-Xa, UFH [[id number]] Collected: 10/04/21 1829     Updated: 10/04/21 1852     Anti-Xa, UFH 0.09 IU/mL     Narrative:  Obtain baseline aPTT prior to heparin initiation if not drawn  previously, to  evaluate for underlying coagulopathy or  recent non-heparin anticoagulant. exposure    CBC without differential [[id number]]  (Abnormal) Collected: 10/04/21 1829    Specimen: Blood Updated: 10/04/21 1845     WBC 5.92 x10 3/uL      Hgb 12.8 g/dL      Hematocrit 40.9 %      Platelets 172 x10 3/uL      RBC 3.97 x10 6/uL      MCV 87.7 fL      MCH 32.2 pg      MCHC 36.8 g/dL      RDW 12 %      MPV 8.4 fL      Nucleated RBC 0.0 /100 WBC      Absolute NRBC 0.00 x10 3/uL     Narrative:      Obtain baseline aPTT prior to heparin initiation if not drawn  previously, to evaluate for underlying coagulopathy or  recent non-heparin anticoagulant. exposure    COVID-19 (SARS-CoV-2) and Influenza A/B, NAA ([facility])- Admission [[id number]]  (Abnormal) Collected: 10/04/21 1753    Specimen: Culturette from Nasopharyngeal Updated: 10/04/21 1835     Purpose of COVID testing Diagnostic -[facility]     SARS-CoV-2 Specimen Source Nasal Swab     SARS CoV 2 Overall Result Detected     Influenza A Not Detected     Influenza B Not Detected    Narrative:      o Collect and clearly label specimen type:  o PREFERRED-Upper respiratory specimen: One Nasal Swab in  Transport Media.  o Hand deliver to laboratory ASAP  Diagnostic -[facility]    D-Dimer [[id number]]  (Abnormal) Collected: 10/04/21 1753     Updated: 10/04/21 1823     D-Dimer 0.90 ug/mL FEU     High Sensitivity Troponin-I [[id number]] Collected: 10/04/21 1648    Specimen: Blood Updated: 10/04/21 1724     hs Troponin-I <2.7 ng/L     Comprehensive metabolic panel [[id number]]  (Abnormal) Collected: 10/04/21 1648    Specimen: Blood Updated: 10/04/21 1718     Glucose 86 mg/dL      BUN 5.0 mg/dL      Creatinine 0.9 mg/dL      Sodium 010 mEq/L      Potassium 4.3 mEq/L      Chloride 90 mEq/L      CO2 22 mEq/L      Calcium 9.0 mg/dL      Protein, Total 7.0 g/dL      Albumin 4.0 g/dL      AST (SGOT) 19 U/L      ALT 13 U/L      Alkaline Phosphatase 61 U/L      Bilirubin, Total 0.4 mg/dL      Globulin 3.0 g/dL       Albumin/Globulin Ratio 1.3     Anion Gap 12.0    GFR [[id number]] Collected: 10/04/21 1648     Updated: 10/04/21 1718     EGFR >60.0       CBC and differential [[id number]]  (Abnormal) Collected: 10/04/21 1648    Specimen: Blood Updated: 10/04/21 1707     WBC 6.25 x10 3/uL      Hgb 13.8 g/dL      Hematocrit 63.8 %      Platelets 183 x10 3/uL      RBC 4.27 x10 6/uL      MCV 88.5 fL  MCH 32.3 pg      MCHC 36.5 g/dL      RDW 12 %      MPV 8.4 fL      Neutrophils 77.2 %      Lymphocytes Automated 11.4 %      Monocytes 8.6 %      Eosinophils Automated 1.4 %      Basophils Automated 1.1 %      Immature Granulocytes 0.3 %      Nucleated RBC 0.0 /100 WBC      Neutrophils Absolute 4.82 x10 3/uL      Lymphocytes Absolute Automated 0.71 x10 3/uL      Monocytes Absolute Automated 0.54 x10 3/uL      Eosinophils Absolute Automated 0.09 x10 3/uL      Basophils Absolute Automated 0.07 x10 3/uL      Immature Granulocytes Absolute 0.02 x10 3/uL      Absolute NRBC 0.00 x10 3/uL             Imaging personally reviewed, including:  XR Chest 2 Views    Result Date: 10/04/2021  No acute intrathoracic process. Shelly Flatten, MD  10/04/2021 5:04 PM    CT Angiogram AAA Chest Abdomen (R/O Dissection)    Result Date: 10/04/2021  1. No aortic dissection or central pulmonary embolism. 2. Coronary artery calcifications. Shelly Flatten, MD  10/04/2021 7:42 PM        Signed by: Jules Schick, MD,  cc:Pcp, None, MD   Date of Service: 10/04/21     Contact Information:  Monday to Friday 7am-6pm:   Primary Contact- Medicine NP/PA- Specific hours/contact information per Sticky Note   Secondary- Medicine Attending  [facility] Hospitalist at pager [phone]  Monday to Friday after 6pm-   Primary Contact- On-Call Hospitalist at pager [phone]  Weekends-   Primary Contact- Medicine Attending  Secondary- On-Call Hospitalist at pager [phone]     This note was generated by the Epic EMR system/ Dragon speech recognition and may contain inherent errors or  omissions not intended by the user. Grammatical errors, random word insertions, deletions and pronoun errors  are occasional consequences of this technology due to software limitations. Not all errors are caught or corrected. If there are questions or concerns about the content of this note or information contained within the body of this dictation they should be addressed directly with the author for clarification.

## 2021-10-04 NOTE — ED Provider Notes (Signed)
E[facility] [name] EMERGENCY DEPARTMENT RESIDENT H&P       CLINICAL INFORMATION        HPI:      Chief Complaint: Chest Pain  .    Erik [name] is a 45 y.o. male with no known medical history who presents with new onset chest pain and pressure that started at 4 AM this morning, awakening him from sleep.  Patient states that it feels like there is an elephant sitting on his chest.  He took 5 tablets of 81 mg baby aspirin (405 mg) prior to arrival to the ED.  Patient states that he took his blood pressure at the pharmacy, with a high of 168/71.  Patient feels pain down his left arm with numbness and tingling in his left fingers.  Patient also endorses shortness of breath, but denies cough.  Chest pain does not radiate to the back.  Patient also endorses mild headache.  Patient has not seen a primary care physician in several years.  Denies PE risk factors, including recent travel, calf pain or swelling, recent trauma/surgery, history of DVT. Patient has positive family history for cardiac problems (his father died at 38 from an MI).    History obtained from: patient          ROS:      Review of Systems   Constitutional:  Negative for chills and fever.   HENT:  Negative for congestion and sore throat.    Eyes:  Negative for pain and visual disturbance.   Respiratory:  Positive for shortness of breath. Negative for cough.    Cardiovascular:  Positive for chest pain. Negative for leg swelling.   Gastrointestinal:  Negative for abdominal pain, constipation, diarrhea, nausea and vomiting.   Genitourinary:  Negative for difficulty urinating and dysuria.   Musculoskeletal:  Negative for arthralgias and myalgias.   Skin:  Negative for color change and rash.   Neurological:  Positive for headaches. Negative for dizziness.       Physical Exam:      Pulse 97   BP 139/83   Resp 17   SpO2 100 %   Temp 98.4 F (36.9 C)    Physical Exam  Constitutional:       General: He is in acute distress.      Appearance:  Normal appearance. He is well-developed. He is ill-appearing.   HENT:      Head: Normocephalic and atraumatic.   Eyes:      Extraocular Movements: Extraocular movements intact.   Cardiovascular:      Rate and Rhythm: Normal rate and regular rhythm.      Pulses:           Carotid pulses are 2+ on the right side and 2+ on the left side.       Radial pulses are 2+ on the right side and 2+ on the left side.        Dorsalis pedis pulses are 2+ on the right side and 2+ on the left side.        Posterior tibial pulses are 2+ on the right side and 2+ on the left side.      Heart sounds: Normal heart sounds.   Pulmonary:      Effort: Pulmonary effort is normal.      Breath sounds: Normal breath sounds.   Chest:      Chest wall: No mass, deformity or tenderness.   Abdominal:      Palpations: Abdomen is  soft.      Tenderness: There is no abdominal tenderness.   Musculoskeletal:         General: Normal range of motion.      Cervical back: Normal range of motion and neck supple.      Right lower leg: No edema.      Left lower leg: No edema.   Skin:     General: Skin is warm and dry.   Neurological:      General: No focal deficit present.      Mental Status: He is alert and oriented to person, place, and time.   Psychiatric:         Mood and Affect: Mood normal.         Behavior: Behavior normal.               PAST HISTORY        Primary Care Provider: No primary care provider on file.        PMH/PSH:    .     History reviewed. No pertinent past medical history.    He has no past surgical history on file.      Social/Family History:      He reports that he has been smoking cigarettes. He does not have any smokeless tobacco history on file. No history on file for alcohol use and drug use.    History reviewed. No pertinent family history.      Listed Medications on Arrival:    .     Home Medications       Med List Status: In Progress Set By: Philomena Course, RN at 10/04/2021  5:03 PM      No Medications           Allergies: He has  No Known Allergies.            VISIT INFORMATION        Reassessments/Clinical Course:    Patient is a 45 y.o. male with no known medical history who presents with new onset chest pain and pressure that started at 4 AM this morning, awakening him from sleep. Patient's vital signs stable. Patient not well appearing on physical exam. EKG with ST depressions in lead II, III, and aVF, V5 and V6. Negative troponin. Patient also with hyponatremia. Plan to trend troponin. Patient without risk factors for PE, to evaluate with d-dimer (and further imaging if necessary).      ED Course as of 10/04/21 2029   Fri Oct 04, 2021   1708 CBC and differential(!)  Unremarkable [CW]   1708 XR Chest 2 Views  FINDINGS:   Cardiomediastinal silhouette is normal. No pulmonary edema. No focal  consolidation, pleural effusion or pneumothorax.    IMPRESSION:     No acute intrathoracic process. [CW]   1718 Comprehensive metabolic panel(!)  Mild hyponatremia, hypochloremia [CW]   1733 hs Troponin-I: <2.7 [AZ]   1752 Repeat ECG unchanged but seill with ST dep II, III, AVF and V5/V6. Elevationin V2. Calling interventionalist [AZ]   1800 Spoke to interventional cardiologists Dr. Elder Love and Dr. Cresenciano Lick, who did not believe it was necessary to activate the cath lab with ST depressions without reciprocal ST elevations, in the setting of negative troponin. Recommend to repeat troponin at the 2 hour mark, start heparin GTT and nitro GTT. Plan for cath lab tomorrow, but will re-evaluate if patient continues to experience pain and/or troponin changes. [CW]   1901 SARS CoV 2 Overall Result(!): Detected [AZ]  1933 Extensive cardiac disease on father's side - dad, grandfather, great grandfather died in 42s of cardiac disease [AZ]   1950 CT Angiogram AAA Chest Abdomen (R/O Dissection)  FINDINGS:  Thoracic aorta is normal in caliber with no dissection flap identified.  No aortic wall thickening. The ascending thoracic aorta measures 3.7 cm  at the level of  the right pulmonary artery. Normal three-vessel  branching pattern of the arch vessels. No enlarged axillary, mediastinal  or hilar lymph nodes.    Central pulmonary arteries are patent. No pulmonary embolism.    Heart size is normal. No pericardial effusion. Coronary artery  calcifications are present.    Mild dependent groundglass opacities. Upper lobe paraseptal and  centrilobular emphysema.    No discrete suspicious pulmonary nodules.    Abdominal aorta is normal caliber. No dissection flap of the abdominal  aorta. Celiac, SMA, renal arteries and IMA are all patent.    Subcentimeter hypodensity in the lateral right liver, too small to  characterize but likely benign cysts. Normal gallbladder, spleen,  pancreas and adrenal glands.    Kidneys enhance symmetrically. No hydronephrosis.    Normal appendix. No dilated or thickened loops of bowel.    No acute osseous abnormality.    IMPRESSION:       1. No aortic dissection or central pulmonary embolism.  2. Coronary artery calcifications. [CW]   1956 hs Troponin-I: <2.7 [CW]   2008 CTA chest no dissection or large PE noted. COVID positive. Trop negative x 2. On nitro and heparin gtts per interventional cardiology, [place] heart to see tomorrow. Admitted to medicine [AZ]      ED Course User Index  [AZ] Bosie Clos Loleta Rose, MD  [CW] Cheryl Flash, MD          Conversations with Other Providers:              Medications Given in the ED:    .     ED Medication Orders (From admission, onward)      None              Procedures:      Procedures      Assessment/Plan:                   Cheryl Flash, MD  Resident  10/04/21 2030

## 2021-10-04 NOTE — Progress Notes (Addendum)
[place] [facility] INTERVENTIONAL CARDIOLOGY OFFICE CONSULTATION NOTE    [facility]  [facility] EMERGENCY DEPT  [address]  [place] [address]  Dept: [phone]  Dept Fax: [phone]  Loc: [phone]         Patient Name: Erik [name]    Date of Visit:  October 04, 2021  Date of Birth: [birth year]  AGE: 45 y.o.  Medical Record #: [id number]  Requesting Physician: Dr. Inetta Fermo    PRIMARY CARDIOLOGIST: No primary cardiologist at this point.     CHIEF COMPLAINT:  Chest Pain      HISTORY OF PRESENT ILLNESS    Mr. Renteria is being seen today for cardiovascular evaluation at the request of Dr. Inetta Fermo. He is a pleasant 45 y.o. male with no prior medical history who presented to the ED with chest pain since 4 AM. He states that it feels like there is an elephant sitting on his chest.  He took 5 tablets of 81 mg baby aspirin (405 mg) prior to arrival to the ED.  Patient states that he took his blood pressure at the pharmacy, with a high of 168/71.  Patient feels pain down his left arm with numbness and tingling in his left fingers. Patient also endorses shortness of breath, but denies cough.  Chest pain does not radiate to the back.  Patient also endorses mild headache.  Patient has not seen a primary care physician in several years.  Denies PE risk factors, including recent travel, calf pain or swelling, recent trauma/surgery, history of DVT.    As the IOC I was called for evaluation of this patient's EKG. EKG shows incomplete right bundle pattern with TWI consistent with right bundle pattern in the inferior and lateral leads. HS trop negative.     I went down to the ED to evaluate the patient. Upon my arrival He reported that the pain was still there but slightly improved. Chest pain worsened with palpation of his chest and also his abdomen. Bedside TTE showed normal EF with normal wall motion. IVC collapsible and RV function WNL. No wall motion abnormalities appreciated.  Serial EKGs  performed while I was in the ED showed no changes/dynamic changes from prior EKG.    PAST MEDICAL HISTORY: No prior medical history to date. He has not seen a doctor in years.     ALLERGIES: No Known Allergies    MEDICATIONS:   Current Facility-Administered Medications:     heparin (porcine) injection 3,000 Units, 3,000 Units, Intravenous, PRN, Bosie Clos, Loleta Rose, MD    heparin 25,000 units in dextrose 5% 500 mL infusion (premix), 11.3 Units/kg/hr, Intravenous, Continuous, Zeke, Loleta Rose, MD, Last Rate: 20.1 mL/hr at 10/04/21 1826, 11.3 Units/kg/hr at 10/04/21 1826    nitroglycerin (TRIDIL) 200 mcg/mL in dextrose 5% 250 mL infusion, 20-100 mcg/min, Intravenous, Continuous, Cheryl Flash, MD, Last Rate: 15 mL/hr at 10/04/21 1838, 50 mcg/min at 10/04/21 1838  No current outpatient medications on file.   Patient's current medications were reviewed. ONLY Cardiac medications were updated unless others were addressed in assessment and plan.    FAMILY HISTORY:   Biological father died of heart attack at age 30 yo  Paternal grandfather and great grandfather also died in their 52s from heart disease  Older brother HTN  Older sister DM  Mother has HLD and HTN  Sister COPD and CVA (mini stroke)     SOCIAL HISTORY: He reports that he has been smoking cigarettes. He does not have any  smokeless tobacco history on file.    REVIEW OF SYSTEMS:   General: Denies recent weight loss, weight gain, fever or chills or change in exercise tolerance.;   Integumentary: Denies any change in hair or nails, rashes, or skin lesions.;   Eyes: Denies diplopia, glaucoma or visual field defects.;   Ears, Nose, Throat, Mouth: Denies any hearing loss, epistaxis, hoarseness or difficulty speaking.;  Respiratory: Denies dyspnea, cough, wheezing or hemoptysis.;   Cardiovascular: Please review HPI;   Abdominal : Denies ulcer disease, hematochezia or melena.;  Musculoskeletal:Denies any venous insufficiency, arthritic symptoms or back problems.;    Neurological : Denies any recurrent strokes, TIA, or seizure disorder.;   Psychiatric: Denies any depression, substance abuse or change in cognitive functions.;   Endocrine: Denies any weight change, heat/cold intolerance, polydipsia, or polyuria;   Hematologic/Immunologic: Denies any food allergies, seasonal allergies, bleeding disorders.   All other systems reviewed and negative except as above.       PHYSICAL EXAMINATION    Visit Vitals  BP 143/78   Pulse 82   Temp 98.4 F (36.9 C) (Oral)   Resp 14   Ht 1.88 m (6\' 2" )   Wt 88.8 kg (195 lb 12.3 oz)   SpO2 97%   BMI 25.14 kg/m        General Appearance:  A well-appearing male in no acute distress.    Skin: Warm and dry to touch, no apparent skin lesions, or masses noted.  Head: Normocephalic, normal hair pattern, no masses or tenderness   Eyes: EOMS Intact, PERRL, conjunctivae and lids unremarkable.  Funduscopic exam and visual fields not performed.   ENT: Ears, Nose and throat reveal no gross abnormalities.  No pallor or cyanosis.  Dentition good.   Neck: JVP normal, no carotid bruit, thyroid not enlarged   Chest: Clear to auscultation bilaterally with good air movement and respiratory effort and no wheezes, rales, or rhonchi   Cardiovascular: Regular rhythm, S1 normal, S2 normal, No S3 or S4. Apical impulse not displaced. No murmur. No gallops or rubs detected   Abdomen: Soft, nontender, nondistended, with normoactive bowel sounds. No organomegaly.  No pulsatile masses, or bruits.   Extremities: Warm without edema, clubbing, or cyanosis. All peripheral pulses are full and equal.   Neuro: Alert and oriented x3. No gross motor or sensory deficits noted, affect appropriate.      ECG: NSR. Incomplete RBBB pattern.     LABS:   Lab Results   Component Value Date    WBC 5.92 10/04/2021    HGB 12.8 10/04/2021    HCT 34.8 (L) 10/04/2021    PLT 172 10/04/2021     Lab Results   Component Value Date    GLU 86 10/04/2021    BUN 5.0 (L) 10/04/2021    CREAT 0.9 10/04/2021     NA 124 (L) 10/04/2021    K 4.3 10/04/2021    CL 90 (L) 10/04/2021    CO2 22 10/04/2021    CA 9.0 10/04/2021    PROT 7.0 10/04/2021    AST 19 10/04/2021    ALT 13 10/04/2021    BILITOTAL 0.4 10/04/2021    GLOB 3.0 10/04/2021    ALB 4.0 10/04/2021     No results found for: MG, TSH, HGBA1C, BNP  No results found for: CHOL, TRIG, HDL, LDL, LABVLDL, CHOLHDLRATIO    Hs trop <2.7  D-dimer 0.90  COVID detected on nasal swab    IMPRESSION:   Mr. Easler is a 45  y.o. male with the following problems:    Chest pain with both typical (elephant on chest) and atypical (reproducible on exam with palpation of chest) features  Family history of CAD  Patient remains hemodynamically and electrically stable  EKG does not meet STEMI criteria   HS trop negative  D-Dimer slightly elevated  COVID detected on nasal swab    RECOMMENDATIONS:    ECG does not meet diagnostic criteria for STEMI. Given ECG findings and clinical presentation, I do not recommend emergency cath lab activation at this time.    Please proceed with workup and treatment for ACS as clinically indicated. This includes aspirin, therapeutic anticoagulation if not contraindicated, repeat ECGs at regular intervals, serial assessments of symptoms, trending of serum troponin levels, and identification/treatment of comorbidities such as congestive heart failure, arrhythmias, anemia, infection, renal failure, electrolyte abnormalities, etc. Recommend initiation of nitro drip for chest pain and titrate up until chest pain free.    Please obtain a formal TTE while in house. [facility] will see the patient on 10/05/21 in consultation and if all else ruled out, will consider further ischemic work-up +/- coronary angiogram.     If the patient's clinical status changes and cath lab activation becomes necessary, the interventional cardiologist on call can be reached through [facility] at [phone]                                              Orders Placed  This Encounter   Procedures    COVID-19 (SARS-CoV-2) and Influenza A/B, NAA ([facility])- Admission    XR Chest 2 Views    CT Angiogram AAA Chest Abdomen (R/O Dissection)    CBC and differential    Comprehensive metabolic panel    High Sensitivity Troponin-I    GFR    D-Dimer    High Sensitivity Troponin-I with calculated Delta    APTT    Anti-Xa, UFH    Anti-Xa, UFH    CBC without differential    CBC without differential    Notify physician If 2 successive anti-Xa <0.1 IU/mL    Notify physician (specify) Signs/Symptoms of Bleeding    Notify physician (specify) Allergic to Heparin/Pork Products    Assess if patient received anticoag last 12 hour    Apply Anticoagulation Arm Band    Education: Anticoagulation    Education: Heparin    Notify physician (specify) Signs/Symptoms of Bleeding    ECG 12 Lead    ECG 12 Lead    ECG - Posterior    ECG 12 lead    ECG 12 Lead    BLEEDING PRECAUTIONS       Orders Placed This Encounter   Medications    DISCONTD: heparin (porcine) injection 4,000 Units    DISCONTD: heparin 25,000 units in dextrose 5% 500 mL infusion (premix)     Order Specific Question:   Indication for use:     Answer:   ACS     Order Specific Question:   Follow Up Heparin Bolus?     Answer:   Yes    DISCONTD: heparin (porcine) injection 3,000 Units    nitroglycerin (TRIDIL) 200 mcg/mL in dextrose 5% 250 mL infusion    heparin (porcine) injection 4,000 Units    heparin 25,000 units in dextrose 5% 500 mL infusion (premix)     Order Specific Question:  Indication for use:     Answer:   ACS     Order Specific Question:   Follow Up Heparin Bolus?     Answer:   Yes    heparin (porcine) injection 3,000 Units    morphine injection 4 mg       SIGNED:    Art Buff, MD  [facility], Interventional Cardiology      This note was generated by the Dragon speech recognition and may contain errors or omissions not intended by the user. Grammatical errors, random word insertions, deletions, pronoun errors, and incomplete  sentences are occasional consequences of this technology due to software limitations. Not all errors are caught or corrected. If there are questions or concerns about the content of this note or information contained within the body of this dictation, they should be addressed directly with the author for clarification.

## 2021-10-05 ENCOUNTER — Inpatient Hospital Stay (HOSPITAL_COMMUNITY): Payer: BC Managed Care – PPO

## 2021-10-05 DIAGNOSIS — R0789 Other chest pain: Secondary | ICD-10-CM

## 2021-10-05 DIAGNOSIS — F17200 Nicotine dependence, unspecified, uncomplicated: Secondary | ICD-10-CM

## 2021-10-05 DIAGNOSIS — Z9289 Personal history of other medical treatment: Secondary | ICD-10-CM

## 2021-10-05 DIAGNOSIS — E871 Hypo-osmolality and hyponatremia: Secondary | ICD-10-CM

## 2021-10-05 DIAGNOSIS — R9431 Abnormal electrocardiogram [ECG] [EKG]: Secondary | ICD-10-CM

## 2021-10-05 DIAGNOSIS — I2510 Atherosclerotic heart disease of native coronary artery without angina pectoris: Secondary | ICD-10-CM

## 2021-10-05 DIAGNOSIS — R079 Chest pain, unspecified: Secondary | ICD-10-CM

## 2021-10-05 DIAGNOSIS — I259 Chronic ischemic heart disease, unspecified: Secondary | ICD-10-CM

## 2021-10-05 HISTORY — DX: Personal history of other medical treatment: Z92.89

## 2021-10-05 LAB — ECHOCARDIOGRAM ADULT COMPLETE W CLR/ DOPP WAVEFORM
AV Area (Cont Eq VTI): 3.12
AV Area (Cont Eq VTI): 3.122
AV Mean Gradient: 2
AV Peak Velocity: 99
Ao Root Diameter (2D): 3.65
Ao Root Diameter (2D): 3.7
BP Mod LV Ejection Fraction: 53.985
IVS Diastolic Thickness (2D): 1
IVS Diastolic Thickness (2D): 1.1
LA Dimension (2D): 3
LA Volume Index (BP A-L): 0.02
LVID diastole (2D): 4.8
LVID diastole (2D): 5
LVID systole (2D): 3.4
MV Area (PHT): 4.145
MV E/A: 0.864
MV E/A: 0.9
MV E/e' (Average): 5.32
Mitral Valve Findings: NORMAL
Pulmonary Valve Findings: NORMAL
RV Basal Diastolic Dimension: 3.7
RV Function: NORMAL
RV Systolic Pressure: 22.892
RV Systolic Pressure: 23
Site RA Size (AS): NORMAL
Site RV Size (AS): NORMAL
TAPSE: 2.83
Tricuspid Valve Findings: NORMAL

## 2021-10-05 LAB — CBC AND DIFFERENTIAL
Absolute NRBC: 0 10*3/uL (ref 0.00–0.00)
Basophils Absolute Automated: 0.04 10*3/uL (ref 0.00–0.08)
Basophils Automated: 0.8 %
Eosinophils Absolute Automated: 0.08 10*3/uL (ref 0.00–0.44)
Eosinophils Automated: 1.6 %
Hematocrit: 37.4 % — ABNORMAL LOW (ref 37.6–49.6)
Hgb: 13.8 g/dL (ref 12.5–17.1)
Immature Granulocytes Absolute: 0.02 10*3/uL (ref 0.00–0.07)
Immature Granulocytes: 0.4 %
Lymphocytes Absolute Automated: 1.06 10*3/uL (ref 0.42–3.22)
Lymphocytes Automated: 21.3 %
MCH: 33.2 pg (ref 25.1–33.5)
MCHC: 36.9 g/dL — ABNORMAL HIGH (ref 31.5–35.8)
MCV: 89.9 fL (ref 78.0–96.0)
MPV: 8.7 fL — ABNORMAL LOW (ref 8.9–12.5)
Monocytes Absolute Automated: 0.36 10*3/uL (ref 0.21–0.85)
Monocytes: 7.2 %
Neutrophils Absolute: 3.42 10*3/uL (ref 1.10–6.33)
Neutrophils: 68.7 %
Nucleated RBC: 0 /100 WBC (ref 0.0–0.0)
Platelets: 172 10*3/uL (ref 142–346)
RBC: 4.16 10*6/uL — ABNORMAL LOW (ref 4.20–5.90)
RDW: 12 % (ref 11–15)
WBC: 4.98 10*3/uL (ref 3.10–9.50)

## 2021-10-05 LAB — BASIC METABOLIC PANEL
Anion Gap: 9 (ref 5.0–15.0)
BUN: 5 mg/dL — ABNORMAL LOW (ref 9.0–28.0)
CO2: 21 mEq/L (ref 17–29)
Calcium: 8.7 mg/dL (ref 8.5–10.5)
Chloride: 94 mEq/L — ABNORMAL LOW (ref 99–111)
Creatinine: 0.9 mg/dL (ref 0.5–1.5)
Glucose: 85 mg/dL (ref 70–100)
Potassium: 3.7 mEq/L (ref 3.5–5.3)
Sodium: 124 mEq/L — ABNORMAL LOW (ref 135–145)

## 2021-10-05 LAB — HIGH SENSITIVITY TROPONIN-I: hs Troponin-I: <2.7 ng/L

## 2021-10-05 LAB — LIPID PANEL
Cholesterol / HDL Ratio: 2.7 Index
Cholesterol: 130 mg/dL (ref 0–199)
HDL: 49 mg/dL (ref 40–9999)
LDL Calculated: 72 mg/dL (ref 0–99)
Triglycerides: 46 mg/dL (ref 34–149)
VLDL Calculated: 9 mg/dL — ABNORMAL LOW (ref 10–40)

## 2021-10-05 LAB — HEMOGLOBIN A1C
Average Estimated Glucose: 96.8 mg/dL
Hemoglobin A1C: 5 % (ref 4.6–5.9)

## 2021-10-05 LAB — PT AND APTT
PT INR: 1 (ref 0.9–1.1)
PT: 12 s (ref 10.1–12.9)
PTT: 34 s (ref 27–39)

## 2021-10-05 LAB — HIGH SENSITIVITY TROPONIN-I WITH DELTA: hs Troponin-I: 2.8 ng/L

## 2021-10-05 LAB — ANTI-XA,UFH: Anti-Xa, UFH: 0.21 IU/mL

## 2021-10-05 LAB — GFR: EGFR: >60

## 2021-10-05 LAB — PHOSPHORUS: Phosphorus: 4.3 mg/dL (ref 2.3–4.7)

## 2021-10-05 LAB — TSH: TSH: 0.8 u[IU]/mL (ref 0.35–4.94)

## 2021-10-05 LAB — MAGNESIUM: Magnesium: 1.8 mg/dL (ref 1.6–2.6)

## 2021-10-05 MED ORDER — ASPIRIN 81 MG PO CHEW
81.0000 mg | CHEWABLE_TABLET | Freq: Every day | ORAL | Status: DC
Start: 2021-10-05 — End: 2021-10-05
  Administered 2021-10-05: 11:00:00 81 mg via ORAL
  Filled 2021-10-05: qty 1

## 2021-10-05 MED ORDER — METOPROLOL SUCCINATE ER 25 MG PO TB24
25.0000 mg | ORAL_TABLET | Freq: Every day | ORAL | 0 refills | Status: DC
Start: 2021-10-06 — End: 2021-11-12

## 2021-10-05 MED ORDER — METOPROLOL SUCCINATE ER 25 MG PO TB24
25.0000 mg | ORAL_TABLET | Freq: Every day | ORAL | Status: DC
Start: 2021-10-05 — End: 2021-10-05
  Administered 2021-10-05: 11:00:00 25 mg via ORAL
  Filled 2021-10-05: qty 1

## 2021-10-05 MED ORDER — ISOSORBIDE MONONITRATE ER 30 MG PO TB24
30.0000 mg | ORAL_TABLET | Freq: Every day | ORAL | 0 refills | Status: DC
Start: 2021-10-06 — End: 2021-11-12

## 2021-10-05 MED ORDER — ISOSORBIDE MONONITRATE ER 30 MG PO TB24
30.0000 mg | ORAL_TABLET | Freq: Every day | ORAL | Status: DC
Start: 2021-10-05 — End: 2021-10-05
  Administered 2021-10-05: 11:00:00 30 mg via ORAL
  Filled 2021-10-05: qty 1

## 2021-10-05 MED ORDER — ATORVASTATIN CALCIUM 40 MG PO TABS
40.0000 mg | ORAL_TABLET | Freq: Every evening | ORAL | Status: DC
Start: 2021-10-05 — End: 2021-10-05

## 2021-10-05 MED ORDER — ATORVASTATIN CALCIUM 40 MG PO TABS
40.0000 mg | ORAL_TABLET | Freq: Every evening | ORAL | 0 refills | Status: DC
Start: 2021-10-05 — End: 2021-11-12

## 2021-10-05 MED ORDER — ASPIRIN 81 MG PO CHEW
81.0000 mg | CHEWABLE_TABLET | Freq: Every day | ORAL | 0 refills | Status: DC
Start: 2021-10-06 — End: 2021-11-12

## 2021-10-05 NOTE — Discharge Instr - AVS First Page (Addendum)
Reason for your Hospital Admission:  You were admitted to the hospital for chest pain. We do not think this was a heart attack. We recommend getting a CT scan of the heart done as an outpatient.     We found that you have low sodium. We recommend having this rechecked within one week.      Instructions for after your discharge:  Please follow medication instructions as listed below.     Please attend follow up appointments as listed below.

## 2021-10-05 NOTE — UM Notes (Signed)
PATIENT NAME: Erik [name], Erik [name]   DOB: 11-30-1975   PMH:  has no past medical history on file.  PSH:  has no past surgical history on file.     Admission Order: Adult Admit to Inpatient ([facility] Only) (Order [id number])  Admission  Date: 10/04/2021 Department: [facility] Ordering/Authorizing: Cheryl Flash, MD     Reprint Order Requisition    Adult Admit to Inpatient ([facility] Only) (Order #[id number]) on 10/04/21     General Information    No case/log ID found     Order Information    Order Date/Time Release Date/Time Start Date/Time End Date/Time   10/04/21 08:04 PM None 10/04/21 08:04 PM 10/04/21 08:04 PM       ADMISSION REVIEW   History of present illness: Pt is a 45 y.o. male arrived at [facility] on (10/04/2021 at 1633). Presented with substernal chest pain and intermittent shortness of breath since a day ago.  Patient reports the chest pain woke him up from sleep. In the ER, p was found to have EKG with ST depressions in lead II, III, aVF, V5 and V6. Delta troponin negative. No wall motion abnormality on bedside echo. CT angio negative for dissection and central PE.  ED physician spoke to interventional cardiology who plans to cath him t today. He is currently on nitro and heparin drips.  Patient tested positive for COVID-19.       Arrival VS: Vitals BP: 139/83, Temp: 98.4 F (36.9 C), Temp Source: Oral, Heart Rate: 97, Resp Rate: 17, SpO2: 100 %, Height: 188 cm (6\' 2" ), Weight: 79.4 kg (175 lb)      Abnormal Labs: hct 34.8, BUN 5.0, na 124, cl 90, D-dimer 0.90, SARS CoV2 detected    Diagnostics:  CTA AAA chest/abd: No aortic dissection or central pulmonary embolism.   2. Coronary artery calcifications.     Medications in ED: Tridil gtt, Heparin IV, Heparin gtt, Morphine IV        Admit to IP status, Dx  Chest pain [R07.9], [facility]      Plan:  -Admit patient to medicine with telemetry  - Obtain serial troponins, TSH, lipid panel, and hemoglobin A1c  - Electrocardiogram  and echocardiogram  - Started on IV heparin and nitroglycerin at the ED  - Interventional radiologist was consulted and they have a plan to do catheterization in a.m.  - Supplemental oxygen via nasal cannula as needed       [facility]  O2: room air  Diet: heart healthy    Medications:   Current Facility-Administered Medications   Medication Dose Route Frequency    aspirin  81 mg Oral Daily    atorvastatin  40 mg Oral QHS    isosorbide mononitrate  30 mg Oral Daily    metoprolol succinate XL  25 mg Oral Daily                 Primary Payor: [employer] / Plan: [employer] BCBS PPO / Product Type: BCBS /     UTILIZATION REVIEW CONTACT: Name: Bobette Mo. Karel Jarvis RN BSN  Clinical Case Manager  - Utilization Review  [facility]  Address:  [address] [place], [place]  [address]  NPI:   [id number]  Tax ID:  [id number]  Phone: [phone]  Fax: [phone]    Please use fax number [phone] to provide authorization for hospital services or to request additional information.

## 2021-10-05 NOTE — Discharge Summary (Signed)
Discharge Summary    Date:10/05/2021   Patient Name: Erik [name]  Attending Physician: Elmarie Shiley, MD      Date of Admission:   10/04/2021    Date of Discharge:   10/05/21    Admitting Diagnosis/reason for admission:   Chest pain    Discharge Dx:     Principal Diagnosis (Diagnosis after study, that is chiefly responsible for admission to inpatient status): Chest pain      Treatment Team:   Treatment Team:   Resident: Cheryl Flash, MD     Procedures performed:   Radiology: all results from this admission  XR Chest 2 Views    Result Date: 10/04/2021  No acute intrathoracic process. Shelly Flatten, MD  10/04/2021 5:04 PM    CT Angiogram AAA Chest Abdomen (R/O Dissection)    Result Date: 10/04/2021  1. No aortic dissection or central pulmonary embolism. 2. Coronary artery calcifications. Shelly Flatten, MD  10/04/2021 7:42 PM     Remainder as below.    Hospital Course:     45 year old male with history of tobacco abuse, family history of premature CAD who presents with substernal chest pain and shortness of breath.  Serial troponins were negative.  He was discharged with plans for CTA outpatient.  He was started on cardiac medications as below.  He was incidentally found to have COVID, for which he was asymptomatic.    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 (At least 6 drinks per day)  - Encouraged PCP follow-up for repeat labs    Condition at Discharge:   Improved.  He denies chest pain, shortness of breath, nausea, vomiting    Today:     BP 121/71    Pulse 73    Temp 97.6 F (36.4 C) (Oral)    Resp 21    Ht 1.88 m (6\' 2" )    Wt 83.4 kg (183 lb 12.8 oz)    SpO2 99%    BMI 23.60 kg/m   Ranges for the last 24  hours:  Temp:  [97.6 F (36.4 C)-98.4 F (36.9 C)] 97.6 F (36.4 C)  Heart Rate:  [64-109] 73  Resp Rate:  [12-21] 21  BP: (96-147)/(56-83) 121/71    General: No acute distress  CV: Normal rate, regular rhythm. No murmurs, gallops, or rubs.  Lungs: Normal work of breathing. Clear to auscultation bilaterally. No wheezes, rales, or rhonchi.  Abdomen: Normoactive bowel sounds, soft, nondistended, nontender to palpation, no rebound or guarding, no palpable masses  Extremities: No edema    Last set of labs   Recent Labs   Lab 10/05/21  0407   WBC 4.98   Hgb 13.8   Hematocrit 37.4*   Platelets 172     Recent Labs   Lab 10/05/21  0407   Sodium 124*   Potassium 3.7   Chloride 94*   CO2 21   BUN 5.0*   Creatinine 0.9   EGFR >60.0   Glucose 85   Calcium 8.7       Micro / Labs / Path pending:     Unresulted Labs       None  Discharge Instructions For Providers     As above.               Discharge Instructions:      Follow-up Information       [facility]. Schedule an appointment as soon as possible for a visit in 1 week(s).    Specialty: Family Medicine  Contact information:  [address]  [place] [address]  [phone]  Additional information:  From the [place]:   Take [address]. Take exit [address] to merge onto [address] toward [place].    Turn right onto [address]. Your destination will be on the left.       From the [place]:   Take [address] towards [address]. Merge onto [address]. Take exit [address]A to merge onto [address]/   [facility] toward [address]. Turn right onto [address]. Your destination will be on the left.              Lynford Humphrey, MD. Schedule an appointment as soon as possible for a visit in 1 week(s).    Specialties: Cardiology, Sleep Medicine  Contact information:  [address]  [place] [address]  [phone]                             Disposition:  Home or Self Care     Discharge Medication List         Taking      aspirin 81 MG chewable tablet  Dose: 81 mg  Start taking on: October 06, 2021  Chew 1 tablet (81 mg) by mouth daily     atorvastatin 40 MG tablet  Dose: 40 mg  Commonly known as: LIPITOR  Take 1 tablet (40 mg) by mouth nightly     isosorbide mononitrate 30 MG 24 hr tablet  Dose: 30 mg  Commonly known as: IMDUR  Start taking on: October 06, 2021  Take 1 tablet (30 mg) by mouth daily     metoprolol succinate XL 25 MG 24 hr tablet  Dose: 25 mg  Commonly known as: TOPROL-XL  Start taking on: October 06, 2021  Take 1 tablet (25 mg) by mouth daily            Minutes spent coordinating discharge and reviewing discharge plan: 45 minutes      Signed by: Elmarie Shiley, MD

## 2021-10-05 NOTE — UM Notes (Incomplete)
Adult Admit to Inpatient ([facility] Only) (Order [id number])  Admission  Date: 10/04/2021 Department: [facility] Ordering/Authorizing: Cheryl Flash, MD     Reprint Order Requisition    Adult Admit to Inpatient ([facility] Only) (Order #[id number]) on 10/04/21     General Information    No case/log ID found     Order Information    Order Date/Time Release Date/Time Start Date/Time End Date/Time   10/04/21 08:04 PM None 10/04/21 08:04 PM 10/04/21 08:04 PM     Changed to Observation Status on 10/05/21 d/t not meeting in-pt criteria      PATIENT NAME: Erik [name], Erik [name]   DOB: 09/14/1976   PMH:  has no past medical history on file.  PSH:  has no past surgical history on file.     Admission Order: Place for Observation Services [phone] [id number])  Admission  Date: 10/05/2021 Department: [facility] Ordering/Authorizing: Elmarie Shiley, MD     Reprint Order Requisition    Place for Observation Services (Order [id number]) on 10/05/21     General Information    No case/log ID found     Order Information    Order Date/Time Release Date/Time Start Date/Time End Date/Time   10/05/21 02:09 PM None 10/05/21 02:07 PM 10/05/21 02:07 PM       OBS REVIEW   History of present illness: Pt is a 45 y.o. male arrived at [facility] on 10/04/2021 at 1633.    Arrival VS: Vitals BP: 139/83, Temp: 98.4 F (36.9 C), Temp Source: Oral, Heart Rate: 97, Resp Rate: 17, SpO2: 100 %, Height: 188 cm (6\' 2" ), Weight: 79.4 kg (175 lb)       Abnormal Labs:    Diagnostics:      Medications in ED:    Place under Observation Services, Dx Chest pain [R07.9]    Medications:   Current Facility-Administered Medications   Medication Dose Route Frequency    aspirin  81 mg Oral Daily    atorvastatin  40 mg Oral QHS    isosorbide mononitrate  30 mg Oral Daily    metoprolol succinate XL  25 mg Oral Daily         MD notes:        Primary Payor: [employer] / Plan: [employer] BCBS PPO / Product Type: BCBS /     UTILIZATION REVIEW CONTACT:  Name:  Bobette Mo. Karel Jarvis RN BSN  Clinical Case Manager  - Utilization Review  [facility]  Address:  [address] [place], [place]  [address]  NPI:   [id number]  Tax ID:  [id number]  Phone:  [phone]  Fax: [phone]  Email:  [email or web address]    Please use fax number [phone] to provide authorization for hospital services or to request additional information.        NOTES TO REVIEWER:    This clinical review is based on/compiled from documentation provided by the treatment team within the patients medical record.

## 2021-10-05 NOTE — Progress Notes (Addendum)
[place] [facility]  PROGRESS NOTE  [facility]     Date Time: 10/05/21 9:36 AM  Patient Name: Erik [name], Erik [name]     Patient Active Problem List   Diagnosis    Chest pain     IMPRESSION:   Erik [name] is a 45 y.o. male with the following problems:     Chest pain with both typical (elephant on chest) and atypical (reproducible on exam with palpation of chest) features, with serial hs-troponins negative despite many hours of pain (8 hs continuous prior to ED presentation)  Family history of CAD father's fatal MI at age 28  Patient remains hemodynamically and electrically stable  EKG does not meet STEMI criteria   HS trop negative  D-Dimer slightly elevated  TTE 10/05/21 EF 54%, normal diastolic fxn, no sig valve dz    CAD on the basis of coronary calcifications seen incidentally on CT chest 12/23 that ruled out dissection, central PE   COVID detected on nasal swab, largely asymptomatic, no oxygen requirements  Tobacco use 1 ppd smoked (prior 3-4 ppd for 20+ years), and 2 cans of dip tobacco every three days  Daily EtOH 6-pack beer and more on weekends  Limited medical follow up- has not seen an outpatient doctor for 10 years      RECOMMENDATIONS:   ACS ruled out with serial neg hs-troponins.  He strongly prefers discharge today. Will plan for outpatient coronary CTA (ordered), and close follow up in our office in several week (ordered). Discharging on ASA,statin, imdur, metoprolol.      I saw and examined the patient.  My exam and findings duplicated as documented in the note above on [facility].  I have personally edited and added to the text of this note to reflect my exam, impression, and recommendations.  I have personally reviewed the patient's history and 24 hour interval events, along with vitals, labs, radiology images, and additional findings found in detail within the note above.  I concur with the above documented care plan which was developed with and reviewed by me.  See above  impressions/recommendations which I have edited to reflect my thoughts.     He is currently CP free. Ruled out by high sensitivity Troponin's. Also has COVID. Can proceed with [place] ing on ASA, higoh dose Statin and Imdur 30 mg daily.     Will arrange for outpatient coronary CTA.  Tobacco cessation.       GEN: NAD, appears stated age  HEENT: No scleral icterus or conjunctival pallor, moist mucous membranes   NECK: No JVD, no  CHEST: No use of accessory muscle.   NEURO: Alert and oriented to time, place and person, normal mood and affect    MSK: Normal muscle strength and tone bilaterally/symmetrically    Lynford Humphrey, MD, [facility]  [place] [facility]     Subjective:   Denies chest pain, SOB or palpitations. Occasional cough, chronic which he attributes to smoking. He strongly prefers discharge today. The chest discomfort was a pressure sensation ongoing for 8 hours prior to ED presentation he estimates, largely constant in that period of time waxing and waning from 10/10 severity to 6/10 severity, and finally resolved around 5 AM this morning.     Medications:      Scheduled Meds: PRN Meds:    aspirin, 81 mg, Oral, Daily        Continuous Infusions:   heparin infusion 25,000 units/500 mL (Cardiac/Low Intensity) 12.3 Units/kg/hr (10/05/21 0555)    nitroglycerin 50 mcg/min (  10/04/21 2129)    acetaminophen, 650 mg, Q6H PRN   Or  acetaminophen, 650 mg, Q6H PRN  dextrose, 15 g of glucose, PRN   And  dextrose, 12.5 g, PRN   And  dextrose, 12.5 g, PRN   And  glucagon (rDNA), 1 mg, PRN  heparin (porcine), 3,000 Units, PRN  magnesium sulfate, 1 g, PRN  melatonin, 3 mg, QHS PRN  naloxone, 0.2 mg, PRN  nitroglycerin, 0.4 mg, Q5 Min PRN  ondansetron, 4 mg, Q6H PRN   Or  ondansetron, 4 mg, Q6H PRN  potassium & sodium phosphates, 2 packet, PRN  potassium chloride, 0-40 mEq, PRN   And  potassium chloride, 10 mEq, PRN          Home Meds:   No medications prior to admission.          Physical Exam:   BP 108/56    Pulse 85    Temp 97.8 F  (36.6 C) (Oral)    Resp 18    Ht 1.88 m (6\' 2" )    Wt 83.4 kg (183 lb 12.8 oz)    SpO2 95%    BMI 23.60 kg/m    O2 Device: None (Room air)    Temp (24hrs), Avg:98.1 F (36.7 C), Min:97.8 F (36.6 C), Max:98.4 F (36.9 C)      Telemetry reviewed      Intake and Output Summary (Last 24 hours) at Date Time    Intake/Output Summary (Last 24 hours) at 10/05/2021 0936  Last data filed at 10/05/2021 0730  Gross per 24 hour   Intake --   Output 1500 ml   Net -1500 ml       General Appearance:  Breathing comfortable, no acute distress  Head:  normocephalic  Eyes:  EOM's intact  Neck:  No carotid bruit    Lungs:  largely clear to auscultation throughout   Chest Wall:  No tenderness or deformity  Heart:  S1 S2 normal No S3 or S4, without murmur/rub.  PMI non displaced   Abdomen:  Soft, non-tender, positive bowel sounds, no hepatojugular reflux  Extremities:  No cyanosis, clubbing or edema  Pulses:  2+ radial   Neurologic:  Alert and oriented x3, mood and affect normal  Musculoskeletal: moves all extremities     Labs:     Recent Labs   Lab 10/05/21  0407 10/05/21  0038   hs Troponin-I <2.7 2.8   hs Troponin-I Delta  --  calc n/a     Recent Labs   Lab 10/04/21  1648   Bilirubin, Total 0.4   Protein, Total 7.0   Albumin 4.0   ALT 13   AST (SGOT) 19     Recent Labs   Lab 10/05/21  0407   Magnesium 1.8     Recent Labs   Lab 10/05/21  0407   PT 12.0   PT INR 1.0   PTT 34     Recent Labs   Lab 10/05/21  0407 10/04/21  1829 10/04/21  1648   WBC 4.98 5.92 6.25   Hgb 13.8 12.8 13.8   Hematocrit 37.4* 34.8* 37.8   Platelets 172 172 183     Recent Labs   Lab 10/05/21  0407 10/04/21  1648   Sodium 124* 124*   Potassium 3.7 4.3   Chloride 94* 90*   CO2 21 22   BUN 5.0* 5.0*   Creatinine 0.9 0.9   EGFR >60.0 >60.0   Glucose 85 86  Calcium 8.7 9.0       No results found for: BNP    Weight Monitoring 10/04/2021 10/04/2021 10/05/2021   Height 188 cm - 188 cm   Height Method Stated - Stated   Weight 79.379 kg 88.8 kg 83.371 kg   Weight  Method Stated Bed Scale Standing Scale   BMI (calculated) 22.5 kg/m2 - 23.6 kg/m2         Imaging:   Radiological Procedure            Signed by: Lewanda Rife, PA      [facility]  APP Spectralink [phone] (8am-5pm)  MD Spectralink [phone] or [phone] (8am-5pm)  Arrhythmia Spectralink [phone] (8am-4:30pm)  Arrhythmia urgent consults or to reach the on-call EP MD [phone]  After hours, non urgent consult line [phone]  After Hours, urgent consults or to reach the on-call MD [phone]

## 2021-10-05 NOTE — Progress Notes (Signed)
Initial Case Management Assessment and Discharge Planning  [facility]   Patient Name: DURANTE, VIOLETT   Date of Birth 03-21-1976   Attending Physician: Elmarie Shiley, MD   Primary Care Physician: Marisa Sprinkles, MD   Length of Stay 1   Reason for Consult / Chief Complaint Chest Pain        Situation   Admission DX:   1. Chest pain        A/O Status: X 3    LACE Score: 4    Patient admitted from: ER  Admission Status: inpatient    Health Care Agent: Self  Name:Celestine R. Betzold  Phone number: [phone]       Background     Advanced directive:   <no information>    Code Status:   Full Code     Residence: One story home/apartment    PCP: PCP None, MD  Patient Contact:   [phone] (home)     There is no such number on file (mobile).     Emergency contact:   Extended Emergency Contact Information  Primary Emergency Contact: Davis-Smith,Andrea  Mobile Phone: [phone]  Relation: Significant Other  Preferred language: English  Interpreter needed? No  Secondary Emergency Contact: Snow,Margaret  Mobile Phone: [phone]  Relation: Mother  Preferred language: English  Interpreter needed? No      ADL/IADL's: Independent  Previous Level of function: 7 Independent     DME: None    Pharmacy:   No Pharmacies Listed    Prescription Coverage: Yes    Home Health: The patient is not currently receiving home health services.    Previous SNF/AR: N/A    COVID Vaccine Status: None    Date First IMM given: N/A  UAI on file?: No  Transport for discharge? Mode of transportation: Private Vehicle - Patient to drive self  Agreeable to Home with family post-discharge:  Yes     Assessment     BARRIERS TO DISCHARGE: Discharge orders written     Recommendation   D/C Plan A: Home with family    D/C Plan B: Home with family    D/C Plan C: Home with family      Patient lives alone on a ground floor apartment. He has no discharge needs, is independent in all aspects. Requesting to d/c home , Covid positive.     Lenor Derrick,  Adelfa Koh, Case manager  [facility]

## 2021-10-05 NOTE — Nursing Progress Note (Signed)
Meds given to patient from OP pharmacy. Patient discharged home, ambulatory.,

## 2021-10-05 NOTE — Nursing Progress Note (Signed)
Ivs Waverly'd, telemetry Savoy'd. Patient dressed himself. Discharge instructions dissussed with and given to patient. Awaiting meds from outpatient pharmacy.

## 2021-10-06 LAB — ECG 12-LEAD
Atrial Rate: 90 {beats}/min
IHS MUSE NARRATIVE AND IMPRESSION: NORMAL
P Axis: 68 degrees
P-R Interval: 162 ms
Q-T Interval: 354 ms
QRS Duration: 110 ms
QTC Calculation (Bezet): 433 ms
R Axis: 97 degrees
T Axis: -18 degrees
Ventricular Rate: 90 {beats}/min

## 2021-10-07 LAB — ECG 12-LEAD
Atrial Rate: 76 {beats}/min
Atrial Rate: 80 {beats}/min
Atrial Rate: 80 {beats}/min
Atrial Rate: 68 {beats}/min
IHS MUSE NARRATIVE AND IMPRESSION: NORMAL
IHS MUSE NARRATIVE AND IMPRESSION: NORMAL
IHS MUSE NARRATIVE AND IMPRESSION: NORMAL
IHS MUSE NARRATIVE AND IMPRESSION: NORMAL
P Axis: 45 degrees
P Axis: 50 degrees
P Axis: 63 degrees
P Axis: 68 degrees
P-R Interval: 150 ms
P-R Interval: 162 ms
P-R Interval: 184 ms
P-R Interval: 180 ms
Q-T Interval: 368 ms
Q-T Interval: 390 ms
Q-T Interval: 390 ms
Q-T Interval: 426 ms
QRS Duration: 106 ms
QRS Duration: 108 ms
QRS Duration: 108 ms
QRS Duration: 108 ms
QTC Calculation (Bezet): 424 ms
QTC Calculation (Bezet): 438 ms
QTC Calculation (Bezet): 449 ms
QTC Calculation (Bezet): 452 ms
R Axis: 68 degrees
R Axis: 80 degrees
R Axis: 97 degrees
R Axis: 85 degrees
T Axis: -10 degrees
T Axis: -27 degrees
T Axis: 2 degrees
T Axis: -77 degrees
Ventricular Rate: 76 {beats}/min
Ventricular Rate: 80 {beats}/min
Ventricular Rate: 80 {beats}/min
Ventricular Rate: 68 {beats}/min

## 2021-10-08 ENCOUNTER — Encounter: Payer: Self-pay | Admitting: Physician Assistant

## 2021-10-08 DIAGNOSIS — R079 Chest pain, unspecified: Secondary | ICD-10-CM

## 2021-11-06 ENCOUNTER — Ambulatory Visit (INDEPENDENT_AMBULATORY_CARE_PROVIDER_SITE_OTHER): Payer: Self-pay | Admitting: Cardiovascular Disease

## 2021-11-11 NOTE — Progress Notes (Unsigned)
[name] C[facility]IOLOGY OFFICE CONSULTATION NOTE    HRT [facility] OFFICE       HEART [facility] OFFICE -CARDIOLOGY  [address]  [place] [address]  Dept: [phone]  Dept Fax: [phone]         Patient Name: Erik [name]    Date of Visit:  November 12, 2021  Date of Birth: 1976-07-22  AGE: 46 y.o.  Medical Record #: [id number]  Requesting Physician: PCP None, MD      CHIEF COMPLAINT:  Hospital Follow-up    HISTORY OF PRESENT ILLNESS    [name] is being seen today for cardiovascular evaluation at the request of PCP None, MD. He is a pleasant 46 y.o. male who is presenting for post-hospitalization follow-up. Patient presented to the ED with chest pain on 10/05/2021 with both typical (elephant on chest) and atypical (reproducible on exam with palpation of chest) features, with serial hs-troponins negative despite many hours of pain (8 hs continuous prior to ED presentation). He has a family history of CAD with his father having a  fatal MI at age 89. Patient remained hemodynamically and electrically stable throughout hospital stay. EKG did not meet STEMI criteria. HS trop negative. D-Dimer slightly elevated. TTE 10/05/21 EF 54%, normal diastolic fxn, no sig valve dz. Diagnosed with CAD on the basis of coronary calcifications seen incidentally on CT chest 12/23 that ruled out dissection, central PE. COVID detected on nasal swab, largely asymptomatic, no oxygen requirements. Tobacco use 1 ppd smoked (prior 3-4 ppd for 20+ years), and 2 cans of dip tobacco every three days. Daily EtOH 6-pack beer and more on weekends. Limited medical follow up- has not seen an outpatient doctor for 10 years. Patient was eager to be discharged and he was sent home, pain free on aspirin, statin, Imdur and metoprolol with plans for outpatient coronary CTA and follow-up with cardiology. Today patient presents for follow-up.     Today patient reports feeling intermittent chest tightness and SOB. It  happens at random. It is worse when he climbs 5 flights of stairs but he is also smoking 1 ppd so he admits to SOB when he is climbing multiple flights of stairs. He is truly okay with 1-2 flights of stairs. The pain and SOB does not occur every time he is active. Some days the symptoms do not occur at all. Some days the symptoms are there constantly. No acute complaints today.     He admits that he has started drinking more. He is drinking about 2 cases of beer a week.     He admits that the medications we gave him helped but he ran out of them 1 week ago.     PAST MEDICAL HISTORY: He has a past medical history of TBI (traumatic brain injury). He has no past surgical history on file.    ALLERGIES: No Known Allergies    MEDICATIONS:   Patient's current medications were reviewed. ONLY Cardiac medications were updated unless others were addressed in assessment and plan.    Current Outpatient Medications:     aspirin 81 MG chewable tablet, Chew 1 tablet (81 mg) by mouth daily, Disp: 90 tablet, Rfl: 3    atorvastatin (LIPITOR) 40 MG tablet, Take 1 tablet (40 mg) by mouth nightly, Disp: 90 tablet, Rfl: 3    isosorbide mononitrate (IMDUR) 30 MG 24 hr tablet, Take 1 tablet (30 mg) by mouth daily, Disp: 90 tablet, Rfl: 3    metoprolol succinate XL (TOPROL-XL) 25  MG 24 hr tablet, Take 1 tablet (25 mg) by mouth daily, Disp: 90 tablet, Rfl: 3     FAMILY HISTORY: family history includes COPD in his brother; Diabetes in his brother; Heart attack in his father and maternal grandfather; Hyperlipidemia in his sister; Hypertension in his sister; Hypothyroidism in his mother.    SOCIAL HISTORY: He reports that he has been smoking cigarettes. He has never used smokeless tobacco. He reports current alcohol use.    REVIEW OF SYSTEMS:   All other systems reviewed and negative except as per HPI.     PHYSICAL EXAMINATION    Visit Vitals  BP 100/70 (BP Site: Left arm, Patient Position: Sitting)   Pulse 79   Wt 82.6 kg (182 lb)   BMI 23.37  kg/m        General Appearance:  A well-appearing male in no acute distress.    Skin: Warm and dry to touch, no apparent skin lesions, or masses noted.  Head: Normocephalic, normal hair pattern, no masses or tenderness   Eyes: EOMS Intact, PERRL, conjunctivae and lids unremarkable.  ENT: Ears, Nose and throat reveal no gross abnormalities.  No pallor or cyanosis.  Neck: JVP normal, no carotid bruit, thyroid not enlarged   Chest: Clear to auscultation bilaterally with good air movement and respiratory effort and no wheezes, rales, or rhonchi   Cardiovascular: Regular rhythm, S1 normal, S2 normal, No S3 or S4. Apical impulse not displaced. No murmur. No gallops or rubs detected   Abdomen: Soft, nontender, nondistended, with normoactive bowel sounds. No organomegaly.  No pulsatile masses, or bruits.   Extremities: Warm without edema. No clubbing, or cyanosis. All peripheral pulses are full and equal.   Neuro: Alert and oriented x3. No gross motor or sensory deficits noted, affect appropriate.      ECG:   Sinus  Rhythm   Nonspecific ST depression  Nonspecific T-abnormality   Nondiagnostic.     LABS:   Lab Results   Component Value Date    WBC 4.98 10/05/2021    HGB 13.8 10/05/2021    HCT 37.4 (L) 10/05/2021    PLT 172 10/05/2021     Lab Results   Component Value Date    GLU 85 10/05/2021    BUN 5.0 (L) 10/05/2021    CREAT 0.9 10/05/2021    NA 124 (L) 10/05/2021    K 3.7 10/05/2021    CL 94 (L) 10/05/2021    CO2 21 10/05/2021    AST 19 10/04/2021    ALT 13 10/04/2021     Lab Results   Component Value Date    MG 1.8 10/05/2021    TSH 0.80 10/05/2021    HGBA1C 5.0 10/05/2021     Lab Results   Component Value Date    CHOL 130 10/05/2021    TRIG 46 10/05/2021    HDL 49 10/05/2021    LDL 72 10/05/2021       IMPRESSION:   [name] is a 46 y.o. male with the following problems:    Chest pain with both typical (elephant on chest) and atypical (reproducible on exam with palpation of chest) features, with serial hs-troponins  negative despite many hours of pain (8 hs continuous prior to ED presentation)  Family history of CAD father's fatal MI at age 44  Patient remains hemodynamically and electrically stable  EKG does not meet STEMI criteria   HS trop negative  D-Dimer slightly elevated  Discharged home on aspirin, statin, Imdur and  BB with plans for outpatient coronary CTA and cards follow-up.   TTE 10/05/21 EF 54%, normal diastolic fxn, no sig valve dz    CAD on the basis of coronary calcifications seen incidentally on CT chest 12/23 that ruled out dissection, central PE   COVID detected on nasal swab, largely asymptomatic, no oxygen requirements  Tobacco use 1 ppd smoked (prior 3-4 ppd for 20+ years), and 2 cans of dip tobacco every three days  Daily EtOH 6-pack beer and more on weekends. He now is having 2 cases of beer a week.   Limited medical follow up- has not seen an outpatient doctor for 10 years     RECOMMENDATIONS:      Chest pain with both typical (elephant on chest) and atypical (reproducible on exam with palpation of chest) features, with serial hs-troponins negative despite many hours of pain (8 hs continuous prior to ED presentation)  Family history of CAD father's fatal MI at age 54  Patient remains hemodynamically and electrically stable  EKG does not meet STEMI criteria   HS trop negative  D-Dimer slightly elevated  Discharged home on aspirin, statin, Imdur and BB with plans for outpatient coronary CTA and cards follow-up.   Continues to have both typical and atypical chest tightness. Unable to schedule CTA so will proceed with exercise SPECT. Discussed about potentially proceeding right to coronary angiogram however given the atypical nature of his symptoms as well, he preferred stress test in the more immediate setting.    Re-filled aspirin, statin, imdur and BB.   TTE 10/05/21 EF 54%, normal diastolic fxn, no sig valve dz    CAD on the basis of coronary calcifications seen incidentally on CT chest 12/23 that ruled  out dissection, central PE   Tobacco use 1 ppd smoked (prior 3-4 ppd for 20+ years), and 2 cans of dip tobacco every three days  Encouraged smoking cessation.   Daily EtOH 6-pack beer and more on weekends. He now is having 2 cases of beer a week.   Encouraged to try and cut down on the amount of EtOH per week.      RTC 1 month following stress test to discuss next steps, or sooner if needed.  Encouraged patient to establish care with a PCP                                               Orders Placed This Encounter   Procedures    NM Myocardial Perfusion Spect (Stress And Rest)    ECG 12 lead    Office Visit (HRT [facility])       Orders Placed This Encounter   Medications    DISCONTD: aspirin 81 MG chewable tablet     Sig: Chew 1 tablet (81 mg) by mouth daily     Dispense:  30 tablet     Refill:  0    DISCONTD: atorvastatin (LIPITOR) 40 MG tablet     Sig: Take 1 tablet (40 mg) by mouth nightly     Dispense:  30 tablet     Refill:  0    DISCONTD: isosorbide mononitrate (IMDUR) 30 MG 24 hr tablet     Sig: Take 1 tablet (30 mg) by mouth daily     Dispense:  30 tablet     Refill:  0    DISCONTD: metoprolol succinate XL (  TOPROL-XL) 25 MG 24 hr tablet     Sig: Take 1 tablet (25 mg) by mouth daily     Dispense:  30 tablet     Refill:  0    aspirin 81 MG chewable tablet     Sig: Chew 1 tablet (81 mg) by mouth daily     Dispense:  90 tablet     Refill:  3    atorvastatin (LIPITOR) 40 MG tablet     Sig: Take 1 tablet (40 mg) by mouth nightly     Dispense:  90 tablet     Refill:  3    isosorbide mononitrate (IMDUR) 30 MG 24 hr tablet     Sig: Take 1 tablet (30 mg) by mouth daily     Dispense:  90 tablet     Refill:  3    metoprolol succinate XL (TOPROL-XL) 25 MG 24 hr tablet     Sig: Take 1 tablet (25 mg) by mouth daily     Dispense:  90 tablet     Refill:  3         SIGNED:    Art Buff, MD         This note was generated by the Dragon speech recognition and may contain errors or omissions not intended by the user. Grammatical  errors, random word insertions, deletions, pronoun errors, and incomplete sentences are occasional consequences of this technology due to software limitations. Not all errors are caught or corrected. If there are questions or concerns about the content of this note or information contained within the body of this dictation, they should be addressed directly with the author for clarification.

## 2021-11-12 ENCOUNTER — Encounter (INDEPENDENT_AMBULATORY_CARE_PROVIDER_SITE_OTHER): Payer: Self-pay | Admitting: Cardiovascular Disease

## 2021-11-12 ENCOUNTER — Ambulatory Visit (INDEPENDENT_AMBULATORY_CARE_PROVIDER_SITE_OTHER): Payer: BC Managed Care – PPO | Admitting: Cardiovascular Disease

## 2021-11-12 DIAGNOSIS — R079 Chest pain, unspecified: Secondary | ICD-10-CM

## 2021-11-12 DIAGNOSIS — I252 Old myocardial infarction: Secondary | ICD-10-CM | POA: Insufficient documentation

## 2021-11-12 DIAGNOSIS — I10 Essential (primary) hypertension: Secondary | ICD-10-CM

## 2021-11-12 MED ORDER — METOPROLOL SUCCINATE ER 25 MG PO TB24
25.0000 mg | ORAL_TABLET | Freq: Every day | ORAL | 3 refills | Status: DC
Start: 2021-11-12 — End: 2022-02-24

## 2021-11-12 MED ORDER — METOPROLOL SUCCINATE ER 25 MG PO TB24
25.0000 mg | ORAL_TABLET | Freq: Every day | ORAL | 0 refills | Status: DC
Start: 2021-11-12 — End: 2021-11-12

## 2021-11-12 MED ORDER — ATORVASTATIN CALCIUM 40 MG PO TABS
40.0000 mg | ORAL_TABLET | Freq: Every evening | ORAL | 3 refills | Status: AC
Start: 2021-11-12 — End: ?

## 2021-11-12 MED ORDER — ISOSORBIDE MONONITRATE ER 30 MG PO TB24
30.0000 mg | ORAL_TABLET | Freq: Every day | ORAL | 0 refills | Status: DC
Start: 2021-11-12 — End: 2021-11-12

## 2021-11-12 MED ORDER — ISOSORBIDE MONONITRATE ER 30 MG PO TB24
30.0000 mg | ORAL_TABLET | Freq: Every day | ORAL | 3 refills | Status: DC
Start: 2021-11-12 — End: 2021-11-28

## 2021-11-12 MED ORDER — ASPIRIN 81 MG PO CHEW
81.0000 mg | CHEWABLE_TABLET | Freq: Every day | ORAL | 0 refills | Status: DC
Start: 2021-11-12 — End: 2021-11-12

## 2021-11-12 MED ORDER — ASPIRIN 81 MG PO CHEW
81.0000 mg | CHEWABLE_TABLET | Freq: Every day | ORAL | 3 refills | Status: DC
Start: 2021-11-12 — End: 2022-02-10

## 2021-11-12 MED ORDER — ATORVASTATIN CALCIUM 40 MG PO TABS
40.0000 mg | ORAL_TABLET | Freq: Every evening | ORAL | 0 refills | Status: DC
Start: 2021-11-12 — End: 2021-11-12

## 2021-11-22 ENCOUNTER — Inpatient Hospital Stay
Admission: RE | Admit: 2021-11-22 | Discharge: 2021-11-22 | Disposition: A | Discharge status: Home / Self Care | Payer: BC Managed Care – PPO | Source: Ambulatory Visit | Attending: Cardiovascular Disease | Admitting: Cardiovascular Disease

## 2021-11-22 ENCOUNTER — Encounter (INDEPENDENT_AMBULATORY_CARE_PROVIDER_SITE_OTHER): Payer: Self-pay | Admitting: Cardiovascular Disease

## 2021-11-22 DIAGNOSIS — I5189 Other ill-defined heart diseases: Secondary | ICD-10-CM | POA: Insufficient documentation

## 2021-11-22 DIAGNOSIS — R9439 Abnormal result of other cardiovascular function study: Secondary | ICD-10-CM | POA: Insufficient documentation

## 2021-11-22 DIAGNOSIS — F17200 Nicotine dependence, unspecified, uncomplicated: Secondary | ICD-10-CM | POA: Insufficient documentation

## 2021-11-22 DIAGNOSIS — R079 Chest pain, unspecified: Secondary | ICD-10-CM

## 2021-11-22 DIAGNOSIS — Z9289 Personal history of other medical treatment: Secondary | ICD-10-CM

## 2021-11-22 DIAGNOSIS — Z8249 Family history of ischemic heart disease and other diseases of the circulatory system: Secondary | ICD-10-CM | POA: Insufficient documentation

## 2021-11-22 DIAGNOSIS — I2510 Atherosclerotic heart disease of native coronary artery without angina pectoris: Secondary | ICD-10-CM | POA: Insufficient documentation

## 2021-11-22 HISTORY — DX: Personal history of other medical treatment: Z92.89

## 2021-11-22 MED ORDER — TECHNETIUM TC 99M TETROFOSMIN IV KIT
35.0000 | PACK | Freq: Once | INTRAVENOUS | Status: AC | PRN
Start: 2021-11-22 — End: 2021-11-22
  Administered 2021-11-22: 35 via INTRAVENOUS
  Filled 2021-11-22: qty 100

## 2021-11-22 MED ORDER — TECHNETIUM TC 99M TETROFOSMIN IV KIT
10.0000 | PACK | Freq: Once | INTRAVENOUS | Status: AC | PRN
Start: 2021-11-22 — End: 2021-11-22
  Administered 2021-11-22: 10 via INTRAVENOUS
  Filled 2021-11-22: qty 100

## 2021-11-28 ENCOUNTER — Encounter (INDEPENDENT_AMBULATORY_CARE_PROVIDER_SITE_OTHER): Payer: Self-pay | Admitting: Cardiovascular Disease

## 2021-11-28 ENCOUNTER — Encounter (INDEPENDENT_AMBULATORY_CARE_PROVIDER_SITE_OTHER): Payer: Self-pay

## 2021-11-28 ENCOUNTER — Ambulatory Visit (INDEPENDENT_AMBULATORY_CARE_PROVIDER_SITE_OTHER): Payer: BC Managed Care – PPO | Admitting: Cardiovascular Disease

## 2021-11-28 ENCOUNTER — Other Ambulatory Visit (INDEPENDENT_AMBULATORY_CARE_PROVIDER_SITE_OTHER): Payer: Self-pay

## 2021-11-28 DIAGNOSIS — Z01818 Encounter for other preprocedural examination: Secondary | ICD-10-CM

## 2021-11-28 DIAGNOSIS — R079 Chest pain, unspecified: Secondary | ICD-10-CM

## 2021-11-28 MED ORDER — ISOSORBIDE MONONITRATE ER 30 MG PO TB24
30.0000 mg | ORAL_TABLET | Freq: Every day | ORAL | 3 refills | Status: DC
Start: 2021-11-28 — End: 2021-12-12

## 2021-11-28 NOTE — Progress Notes (Signed)
[facility] CARDIOLOG[facility] OFFICE PROGRESS NOTE    HRT [facility] OFFICE      [facility] OFFICE -CARDIOLOGY  [address]  [place] [address]  Dept: [phone]  Dept Fax: [phone]       Patient Name: Erik [name]    Date of Visit:  November 28, 2021  Date of Birth: [birth year]  AGE: 46 y.o.  Medical Record #: [id number]  Requesting Physician: PCP None, MD      CHIEF COMPLAINT: Results (MPI)      HISTORY OF PRESENT ILLNESS:    He is a pleasant 46 y.o. male who presents today for follow-up. Patient presented to the ED with chest pain and ruled out for ACS. He was discharged home on aspirin, statin, Imdur and BB. He followed up with myself in clinic and was reporting chest pain. Referred patient for exercise SPECT which showed inferior and inferolateral wall ischemia. He presents today for further evaluation and to go over stress test result.     Today patient reports persistent chest discomfort both with activity and with rest. He has been compliant with aspirin, metoprolol and atorvastatin. He did not receive Imdur. No other acute complaints.     PAST MEDICAL HISTORY: He has a past medical history of COVID (09/2021), History of echocardiogram (10/05/2021), History of nuclear stress test (11/22/2021), Hyperlipidemia, and TBI (traumatic brain injury). He has no past surgical history on file.    ALLERGIES: No Known Allergies    MEDICATIONS:   Patient's current medications were reviewed. ONLY Cardiac medications were updated unless others were addressed in assessment and plan.    Current Outpatient Medications:     aspirin 81 MG chewable tablet, Chew 1 tablet (81 mg) by mouth daily, Disp: 90 tablet, Rfl: 3    atorvastatin (LIPITOR) 40 MG tablet, Take 1 tablet (40 mg) by mouth nightly, Disp: 90 tablet, Rfl: 3    metoprolol succinate XL (TOPROL-XL) 25 MG 24 hr tablet, Take 1 tablet (25 mg) by mouth daily, Disp: 90 tablet, Rfl: 3    isosorbide mononitrate (IMDUR) 30 MG 24 hr tablet,  Take 1 tablet (30 mg) by mouth daily, Disp: 90 tablet, Rfl: 3     FAMILY HISTORY: family history includes COPD in his brother; Diabetes in his brother; Heart attack in his father and maternal grandfather; Hyperlipidemia in his sister; Hypertension in his sister; Hypothyroidism in his mother.    SOCIAL HISTORY: He reports that he has been smoking cigarettes. He has never used smokeless tobacco. He reports current alcohol use of about 66.0 standard drinks per week.    PHYSICAL EXAMINATION    Visit Vitals  BP 120/70 (BP Site: Left arm, Patient Position: Sitting, Cuff Size: Medium)   Resp 18   Ht 1.88 m (6\' 2" )   Wt 80.7 kg (178 lb)   BMI 22.85 kg/m       General Appearance:  A well-appearing male in no acute distress.    Skin: Warm and dry to touch, no apparent skin lesions, or masses noted.  Head: Normocephalic, normal hair pattern, no masses or tenderness   Eyes: EOMS Intact, PERRL, conjunctivae and lids unremarkable.  ENT: Ears, Nose and throat reveal no gross abnormalities.  No pallor or cyanosis.  Dentition good.   Neck: JVP normal, no carotid bruit, thyroid not enlarged   Chest: Clear to auscultation bilaterally with good air movement and respiratory effort and no wheezes, rales, or rhonchi   Cardiovascular: Regular rhythm, S1 normal, S2  normal, No S3 or S4. Apical impulse not displaced. No murmur. No gallops or rubs detected   Abdomen: Soft, nontender, nondistended, with normoactive bowel sounds. No organomegaly.  No pulsatile masses, or bruits.   Extremities: Warm without edema. No clubbing, or cyanosis. All peripheral pulses are full and equal.   Neuro: Alert and oriented x3. No gross motor or sensory deficits noted, affect appropriate.      LABS:   Lab Results   Component Value Date    WBC 4.98 10/05/2021    HGB 13.8 10/05/2021    HCT 37.4 (L) 10/05/2021    PLT 172 10/05/2021     Lab Results   Component Value Date    GLU 85 10/05/2021    BUN 5.0 (L) 10/05/2021    CREAT 0.9 10/05/2021    NA 124 (L) 10/05/2021     K 3.7 10/05/2021    CL 94 (L) 10/05/2021    CO2 21 10/05/2021    AST 19 10/04/2021    ALT 13 10/04/2021     Lab Results   Component Value Date    MG 1.8 10/05/2021    TSH 0.80 10/05/2021    HGBA1C 5.0 10/05/2021     Lab Results   Component Value Date    CHOL 130 10/05/2021    TRIG 46 10/05/2021    HDL 49 10/05/2021    LDL 72 10/05/2021     Most recent echo and nuclear study reviewed.    IMPRESSION:   Mr. Blann is a 46 y.o. male with the following problems:    Chest pain with both typical (elephant on chest) and atypical (reproducible on exam with palpation of chest) features, with serial hs-troponins negative despite many hours of pain (8 hs continuous prior to ED presentation)  CAD on the basis of coronary calcifications seen incidentally on CT chest 12/23 that ruled out dissection, central PE   Family history of CAD father's fatal MI at age 49  Patient remains hemodynamically and electrically stable  EKG does not meet STEMI criteria   HS trop negative  D-Dimer slightly elevated  Discharged home on aspirin, statin, Imdur and BB   Exercise SPECT 11/2021 showing inferior and inferolateral wall ischemia.  TTE 10/05/21 EF 54%, normal diastolic fxn, no sig valve dz    COVID detected on nasal swab, largely asymptomatic, no oxygen requirements  Tobacco use 1 ppd smoked (prior 3-4 ppd for 20+ years), and 2 cans of dip tobacco every three days  Daily EtOH 6-pack beer and more on weekends. He now is having 2 cases of beer a week.   Limited medical follow up- has not seen an outpatient doctor for 10 years     RECOMMENDATIONS:    Chest pain with both typical (elephant on chest) and atypical (reproducible on exam with palpation of chest) features, with serial hs-troponins negative despite many hours of pain (8 hs continuous prior to ED presentation)  CAD on the basis of coronary calcifications seen incidentally on CT chest 12/23 that ruled out dissection, central PE   Family history of CAD father's fatal MI at age 58  Patient  remains hemodynamically and electrically stable  EKG does not meet STEMI criteria   HS trop negative  D-Dimer slightly elevated  Discharged home on aspirin, statin, Imdur and BB   Exercise SPECT 11/2021 showing inferior and inferolateral wall ischemia.  Case request placed for coronary angiogram +/- PCI with myself. Please hold metoprolol and Imdur day of cath. Continue aspirin and  statin.   Continue aspirin, statin, BB, imdur.   HLD  Continue statin    RTC 3 months following cardiac cath. RTC sooner if needed. Advised to report to the ED should patient experience any worsening chest discomfort.                                                Orders Placed This Encounter   Procedures    Cardiac Cath Case Request    Office Visit (HRT [place])       Orders Placed This Encounter   Medications    isosorbide mononitrate (IMDUR) 30 MG 24 hr tablet     Sig: Take 1 tablet (30 mg) by mouth daily     Dispense:  90 tablet     Refill:  3       SIGNED:    Art Buff, MD          This note was generated by the Dragon speech recognition and may contain errors or omissions not intended by the user. Grammatical errors, random word insertions, deletions, pronoun errors, and incomplete sentences are occasional consequences of this technology due to software limitations. Not all errors are caught or corrected. If there are questions or concerns about the content of this note or information contained within the body of this dictation, they should be addressed directly with the author for clarification.

## 2021-11-28 NOTE — Progress Notes (Addendum)
[facility]   GENERAL CARDIOLOGY PROCEDURE SCHEDULING SHEET    PATIENT NAME: Adrion Menz  MEDICAL RECORD NUMBER: [id number]  DOB: Oct 03, 1976 (46 y.o.)      Procedure Ordered: LHC with possible PCI  Ordering Diagnosis: abnormal MPI    Preferred Procedure Date: next avail  Preferred Doctor: Dr. Elder Love only  Preferred Hospital:  [] [facility] - [x] [facility] - [] [place] [facility]  [] [facility] - [] [facility] - [] [facility]  P  PT should hold imdur and metoprolol am of procedure per Dr. Elder Love    ALLERGIES: No Known Allergies    IODINE ALLERGY: [] YES / [x] NO    If Yes, for procedures with contrast, the patient should take:  Prednisone 50mg  - 13 hours, 7 hours and 1 hour (bring last dose to hospital) prior to the procedure   Benadryl 50 mg - 1 hour prior to the procedure (bring dose to hospital)                 DIABETIC: [] YES / [x] NO     If Yes, patient should hold their   the morning of the procedure.    Type 1 diabetics and those on long acting insulin or have an insulin pump should check with their prescribing provider for hold instructions.     CREATININE LEVEL > 1.5: [] YES / [x] NO       Last Cr on file:   Lab Results   Component Value Date    CREAT 0.9 10/05/2021        If Yes, for procedures with contrast, patient should increase their fluid intake the day prior to the procedure.   Patient should hold their   24 hours prior to the procedure.   Normal Saline should be started at 2cc/kg/hr 1 hour prior to the procedure (unless EF is less than 35% then provider should be consulted)     IS PATIENT ON ANTICOAGULATION: [] YES / [x] NO   (instructions to continue or hold their anticoagulant must be addressed)    If Yes, patient should      IS PATIENT TAKING DIURETICS: [] YES / [x] NO     If Yes, patient should hold their   the morning of the procedure.     ADDITIONAL MEDICATIONS NEEDED:     CONSENT FORMS SIGNED IN IMED [] YES / [x] NO  OR FAXED (for non [facility] hospitals): [] YES /  [x] NO     PRE OP BLOODWORK ORDERED: [x] YES / [] NO    Is this a TEE ordered to be done at [facility]: [] YES / [x] NO    If Yes, does the patient have severe RV Dysfunction mentioned on their most recent echo: [] Yes / [] No)  Does this patient have severe Pulmonary Hypertension (RVSP >9mmHg) mentioned on their most recent echo: [] YES / [] NO  Current BMI: There is no height or weight on file to calculate BMI. ______  Most Recent EF: _________                         AREA BELOW DOTTED LINE TO BE COMPLETED BY HOSPITAL SCHEDULERS ONLY  -------------------------------------------------------------------------------------------    INSURANCE PHONE#:________________________ ID#:____________________ GROUP#:_________________    DATE PRE-CERTED:_________________________   CONTACT NAME:____________________________________    PACER REP CALLED:________________________ AUTH#:___________________________________________    PT CALLED:_________ LAB:_________ ADMITTING:_________ ADMIT DATE:_________ ORDER/APPT______    INSTRUCTION SHEET MAILED:________________   DOCTOR PERFORMING:_______________________________    REQUEST INFO FROM DR. OFFICE: ________________________________________________________________    PT CHECK IN TIME:________________________   DATE &  TIME OF PROCEDURE:________________________    TX FR: ___________________________________          INPT   or   OUTPT   or   ADMIT

## 2021-11-29 ENCOUNTER — Encounter (INDEPENDENT_AMBULATORY_CARE_PROVIDER_SITE_OTHER): Payer: Self-pay

## 2021-12-02 ENCOUNTER — Telehealth (INDEPENDENT_AMBULATORY_CARE_PROVIDER_SITE_OTHER): Payer: Self-pay

## 2021-12-02 NOTE — Telephone Encounter (Signed)
Pt called in asking about his cardiac cath and if he will be given the results same day : explained yes as soon as the procedure if over , the MD will come speak w him . He asked if he needs open heart , how soon will that be after the cath : explained that will also be discussed after the cath and no idea on the time line , but lets takes one step at a time . Pt verbalized understanding

## 2021-12-02 NOTE — Telephone Encounter (Signed)
Pt called to inquire if we can rx anything for anxiety as he is anxious about getting the cath done. Advised pt he will have to talk to his PCP about this. He states understanding.

## 2021-12-10 ENCOUNTER — Ambulatory Visit (INDEPENDENT_AMBULATORY_CARE_PROVIDER_SITE_OTHER): Payer: Commercial Managed Care - POS

## 2021-12-10 ENCOUNTER — Encounter (INDEPENDENT_AMBULATORY_CARE_PROVIDER_SITE_OTHER): Payer: Self-pay

## 2021-12-10 DIAGNOSIS — R079 Chest pain, unspecified: Secondary | ICD-10-CM

## 2021-12-10 HISTORY — DX: Chest pain, unspecified: R07.9

## 2021-12-10 NOTE — Pre-Procedure Instructions (Signed)
Important Instructions Before Your Procedure        Your case is currently scheduled for 12/12/2021 with Cilia, Early Chars, MD.        Get your lab work ASAP. I gave you the phone number and address to [facility]. Please call them ASAP and verify you can get your lab work done there ASAP.      The date and/or time of your surgery may change.  Your surgeon's office will notify you, up until the business day before surgery, if there is any change to your surgery date or time.  Please don't hesitate to call your surgeon's office directly with any questions.        IMPORTANT You must visit the Preparing for Your Procedure guide link below for additional instructions including fasting guidelines and directions before your procedure. If you received fasting or skin preparation instructions from your surgeon or pre-procedural provider, please follow those specific instructions. The instructions here are general instructions that do not pertain to all patients.  [email or web address]    If the link doesn't open, please copy and paste in your browser    QUESTIONS?  If you have any questions about your pre-procedural evaluation, please call the clinic location where your appointment was performed:     [facility]: [phone]  [facility]: [phone]  [facility]: [phone]  [place]: [phone]  [place]: [phone]

## 2021-12-10 NOTE — PSS Phone Screening (Signed)
Pre-Anesthesia Evaluation    Pre-op phone visit requested by:   Reason for pre-op phone visit: Patient anticipating LHC w/ Coronary Angios and LV procedure.    No orders of the defined types were placed in this encounter.      History of Present Illness/Summary:        Problem List:  Medical Problems       Hospital Problem List  Date Reviewed: 11/28/2021   None        Non-Hospital Problem List  Date Reviewed: 11/28/2021            ICD-10-CM Priority Class Noted    Chest pain R07.9   10/04/2021    History of heart attack I25.2   11/12/2021        Medical History   Diagnosis Date    Chest pain 12/10/2021    COVID 09/2021    11/2021: asymptomatic    Hearing loss in Left ear     History of echocardiogram 10/05/2021    EF 54    History of nuclear stress test: exercise SPECT abn 11/22/2021    Hyperlipidemia     on Rx    TBI (traumatic brain injury) 1999     Past Surgical History:   Procedure Laterality Date    DENTAL SURGERY         Current Outpatient Medications:     aspirin 81 MG chewable tablet, Chew 1 tablet (81 mg) by mouth daily (Patient taking differently: Chew 81 mg by mouth every morning), Disp: 90 tablet, Rfl: 3    atorvastatin (LIPITOR) 40 MG tablet, Take 1 tablet (40 mg) by mouth nightly, Disp: 90 tablet, Rfl: 3    isosorbide mononitrate (IMDUR) 30 MG 24 hr tablet, Take 1 tablet (30 mg) by mouth daily (Patient taking differently: Take 30 mg by mouth every morning), Disp: 90 tablet, Rfl: 3    metoprolol succinate XL (TOPROL-XL) 25 MG 24 hr tablet, Take 1 tablet (25 mg) by mouth daily (Patient taking differently: Take 25 mg by mouth every morning), Disp: 90 tablet, Rfl: 3     Medication List            Accurate as of December 10, 2021  3:54 PM. Always use your most recent med list.                aspirin 81 MG chewable tablet  Chew 1 tablet (81 mg) by mouth daily  Medication Adjustments for Surgery: Take morning of surgery     atorvastatin 40 MG tablet  Take 1 tablet (40 mg) by mouth nightly  Commonly known as:  LIPITOR  Medication Adjustments for Surgery: Take as prescribed     isosorbide mononitrate 30 MG 24 hr tablet  Take 1 tablet (30 mg) by mouth daily  Commonly known as: IMDUR  Medication Adjustments for Surgery: Hold day of surgery  Notes to patient: States per MD: do not take the morning of your procedure     metoprolol succinate XL 25 MG 24 hr tablet  Take 1 tablet (25 mg) by mouth daily  Commonly known as: TOPROL-XL  Medication Adjustments for Surgery: Hold day of surgery  Notes to patient: States per MD: do not take the morning of your procedure            No Known Allergies  Family History   Problem Relation Age of Onset    Hypothyroidism Mother     Heart attack Father     Hyperlipidemia Sister  Hypertension Sister     Diabetes Brother     COPD Brother     Heart attack Maternal Grandfather      Social History     Occupational History    Not on file   Tobacco Use    Smoking status: Every Day     Packs/day: 1.00     Years: 40.00     Pack years: 40.00     Types: Cigarettes    Smokeless tobacco: Never   Vaping Use    Vaping Use: Never used   Substance and Sexual Activity    Alcohol use: Yes     Alcohol/week: 66.0 standard drinks     Types: 66 Cans of beer per week     Comment: states he drinks 64-66 beers each week, regularly    Drug use: Never    Sexual activity: Not on file             Exam Scores:   SDB score Risk Category: No Risk    PONV score      MST score MST Score: 0    Allergy score      Frailty score         Visit Vitals  Ht 1.88 m (6\' 2" )   Wt 80.7 kg (178 lb)   BMI 22.85 kg/m       Recent Labs   CBC (last 180 days) 10/04/21  1829 10/05/21  0407   WBC 5.92 4.98   RBC 3.97* 4.16*   Hgb 12.8 13.8   Hematocrit 34.8* 37.4*   MCV 87.7 89.9   MCH 32.2 33.2   MCHC 36.8* 36.9*   RDW 12 12   Platelets 172 172   MPV 8.4* 8.7*   Nucleated RBC 0.0 0.0   Absolute NRBC 0.00 0.00     Recent Labs   BMP (last 180 days) 10/04/21  1648 10/05/21  0407   Glucose 86 85   BUN 5.0* 5.0*   Creatinine 0.9 0.9   Sodium 124*  124*   Potassium 4.3 3.7   Chloride 90* 94*   CO2 22 21   Calcium 9.0 8.7   Anion Gap 12.0 9.0   EGFR >60.0 >60.0     Recent Labs   Coag Panel (last 180 days) 10/04/21  1829 10/05/21  0407   PTT 30 34   PT  --  12.0   PT INR  --  1.0     Recent Labs   Other (last 180 days) 10/04/21  1648 10/05/21  0407   TSH  --  0.80   ALT 13  --    AST (SGOT) 19  --    Protein, Total 7.0  --    Hemoglobin A1C  --  5.0

## 2021-12-11 ENCOUNTER — Other Ambulatory Visit (FREE_STANDING_LABORATORY_FACILITY): Payer: BC Managed Care – PPO

## 2021-12-11 DIAGNOSIS — R079 Chest pain, unspecified: Secondary | ICD-10-CM

## 2021-12-11 DIAGNOSIS — Z01818 Encounter for other preprocedural examination: Secondary | ICD-10-CM

## 2021-12-11 LAB — CBC AND DIFFERENTIAL
Absolute NRBC: 0 10*3/uL (ref 0.00–0.00)
Basophils Absolute Automated: 0.08 10*3/uL (ref 0.00–0.08)
Basophils Automated: 0.8 %
Eosinophils Absolute Automated: 0.33 10*3/uL (ref 0.00–0.44)
Eosinophils Automated: 3.2 %
Hematocrit: 43.9 % (ref 37.6–49.6)
Hgb: 15.6 g/dL (ref 12.5–17.1)
Immature Granulocytes Absolute: 0.04 10*3/uL (ref 0.00–0.07)
Immature Granulocytes: 0.4 %
Instrument Absolute Neutrophil Count: 7.11 10*3/uL — ABNORMAL HIGH (ref 1.10–6.33)
Lymphocytes Absolute Automated: 2.28 10*3/uL (ref 0.42–3.22)
Lymphocytes Automated: 22.1 %
MCH: 31.8 pg (ref 25.1–33.5)
MCHC: 35.5 g/dL (ref 31.5–35.8)
MCV: 89.4 fL (ref 78.0–96.0)
MPV: 8.6 fL — ABNORMAL LOW (ref 8.9–12.5)
Monocytes Absolute Automated: 0.48 10*3/uL (ref 0.21–0.85)
Monocytes: 4.7 %
Neutrophils Absolute: 7.11 10*3/uL — ABNORMAL HIGH (ref 1.10–6.33)
Neutrophils: 68.8 %
Nucleated RBC: 0 /100 WBC (ref 0.0–0.0)
Platelets: 235 10*3/uL (ref 142–346)
RBC: 4.91 10*6/uL (ref 4.20–5.90)
RDW: 12 % (ref 11–15)
WBC: 10.32 10*3/uL — ABNORMAL HIGH (ref 3.10–9.50)

## 2021-12-11 LAB — BASIC METABOLIC PANEL
Anion Gap: 9 (ref 5.0–15.0)
BUN: 8 mg/dL — ABNORMAL LOW (ref 9.0–28.0)
CO2: 24 mEq/L (ref 17–29)
Calcium: 9.7 mg/dL (ref 8.5–10.5)
Chloride: 96 mEq/L — ABNORMAL LOW (ref 99–111)
Creatinine: 1 mg/dL (ref 0.5–1.5)
Glucose: 93 mg/dL (ref 70–100)
Potassium: 4.9 mEq/L (ref 3.5–5.3)
Sodium: 129 mEq/L — ABNORMAL LOW (ref 135–145)

## 2021-12-11 LAB — GFR: EGFR: >60

## 2021-12-11 LAB — HEMOLYSIS INDEX: Hemolysis Index: 16 Index (ref 0–24)

## 2021-12-12 ENCOUNTER — Encounter
Admission: RE | Disposition: A | Discharge status: Home / Self Care | Payer: Self-pay | Source: Ambulatory Visit | Attending: Cardiovascular Disease

## 2021-12-12 ENCOUNTER — Ambulatory Visit
Admission: RE | Admit: 2021-12-12 | Discharge: 2021-12-12 | Disposition: A | Discharge status: Home / Self Care | Payer: BC Managed Care – PPO | Source: Ambulatory Visit | Attending: Cardiovascular Disease | Admitting: Cardiovascular Disease

## 2021-12-12 DIAGNOSIS — I2582 Chronic total occlusion of coronary artery: Secondary | ICD-10-CM | POA: Insufficient documentation

## 2021-12-12 DIAGNOSIS — R9439 Abnormal result of other cardiovascular function study: Secondary | ICD-10-CM | POA: Diagnosis present

## 2021-12-12 DIAGNOSIS — I2510 Atherosclerotic heart disease of native coronary artery without angina pectoris: Secondary | ICD-10-CM | POA: Insufficient documentation

## 2021-12-12 DIAGNOSIS — R079 Chest pain, unspecified: Secondary | ICD-10-CM | POA: Insufficient documentation

## 2021-12-12 DIAGNOSIS — Z8249 Family history of ischemic heart disease and other diseases of the circulatory system: Secondary | ICD-10-CM | POA: Insufficient documentation

## 2021-12-12 HISTORY — PX: LHC W/ CORONARY ANGIOS AND LV: CATH8

## 2021-12-12 SURGERY — LHC W/ CORONARY ANGIOS AND LV
Anesthesia: Conscious Sedation | Site: Arm Lower | Laterality: Left

## 2021-12-12 MED ORDER — NITROGLYCERIN IN D5W 200-5 MCG/ML-% IV SOLN VIAL
INTRAVENOUS | Status: AC | PRN
Start: 2021-12-12 — End: 2021-12-12
  Administered 2021-12-12: 200 ug via INTRA_ARTERIAL

## 2021-12-12 MED ORDER — MIDAZOLAM HCL 1 MG/ML IJ SOLN (WRAP)
INTRAMUSCULAR | Status: AC | PRN
Start: 2021-12-12 — End: 2021-12-12
  Administered 2021-12-12: 2 mg via INTRAVENOUS

## 2021-12-12 MED ORDER — FENTANYL CITRATE (PF) 50 MCG/ML IJ SOLN (WRAP)
INTRAMUSCULAR | Status: AC | PRN
Start: 2021-12-12 — End: 2021-12-12
  Administered 2021-12-12: 50 ug via INTRAVENOUS

## 2021-12-12 MED ORDER — FENTANYL CITRATE (PF) 50 MCG/ML IJ SOLN (WRAP)
INTRAMUSCULAR | Status: AC
Start: 2021-12-12 — End: ?
  Filled 2021-12-12: qty 2

## 2021-12-12 MED ORDER — MIDAZOLAM HCL 1 MG/ML IJ SOLN (WRAP)
INTRAMUSCULAR | Status: AC
Start: 2021-12-12 — End: ?
  Filled 2021-12-12: qty 2

## 2021-12-12 MED ORDER — SODIUM CHLORIDE 0.9 % IV SOLN
INTRAVENOUS | Status: DC
Start: 2021-12-12 — End: 2021-12-12

## 2021-12-12 MED ORDER — SODIUM CHLORIDE 0.9 % IV SOLN
INTRAVENOUS | Status: AC
Start: 2021-12-12 — End: 2021-12-12

## 2021-12-12 MED ORDER — ISOSORBIDE MONONITRATE ER 30 MG PO TB24
60.0000 mg | ORAL_TABLET | Freq: Every day | ORAL | 3 refills | Status: DC
Start: 2021-12-12 — End: 2022-01-08

## 2021-12-12 MED ORDER — IOHEXOL 350 MG/ML IV SOLN
70.0000 mL | Freq: Once | INTRAVENOUS | Status: AC | PRN
Start: 2021-12-12 — End: 2021-12-12
  Administered 2021-12-12: 70 mL via INTRA_ARTERIAL

## 2021-12-12 MED ORDER — HEPARIN SODIUM (PORCINE) 1000 UNIT/ML IJ SOLN
INTRAMUSCULAR | Status: AC
Start: 2021-12-12 — End: ?
  Filled 2021-12-12: qty 10

## 2021-12-12 MED ORDER — HEPARIN SODIUM (PORCINE) 1000 UNIT/ML IJ SOLN
INTRAMUSCULAR | Status: AC | PRN
Start: 2021-12-12 — End: 2021-12-12
  Administered 2021-12-12: 4000 [IU] via INTRAVENOUS

## 2021-12-12 MED ORDER — RANOLAZINE ER 500 MG PO TB12
500.0000 mg | ORAL_TABLET | Freq: Two times a day (BID) | ORAL | 3 refills | Status: DC
Start: 2021-12-12 — End: 2022-01-06

## 2021-12-12 MED ORDER — FENTANYL CITRATE (PF) 50 MCG/ML IJ SOLN (WRAP)
INTRAMUSCULAR | Status: AC | PRN
Start: 2021-12-12 — End: 2021-12-12
  Administered 2021-12-12: 25 ug via INTRAVENOUS

## 2021-12-12 MED ORDER — NITROGLYCERIN IN D5W 200-5 MCG/ML-% IV SOLN
INTRAVENOUS | Status: AC
Start: 2021-12-12 — End: ?
  Filled 2021-12-12: qty 250

## 2021-12-12 MED ORDER — HEPARIN (PORCINE) IN NACL 1000-0.9 UT/500ML-% IV SOLN - TABLE FLUSH (SEDATION NARRATOR)
INTRAVENOUS | Status: AC | PRN
Start: 2021-12-12 — End: 2021-12-12
  Administered 2021-12-12: 3000 [IU]

## 2021-12-12 MED ORDER — ACETAMINOPHEN 325 MG PO TABS
650.0000 mg | ORAL_TABLET | ORAL | Status: DC | PRN
Start: 2021-12-12 — End: 2021-12-12
  Administered 2021-12-12: 650 mg via ORAL
  Filled 2021-12-12: qty 2

## 2021-12-12 MED ORDER — MIDAZOLAM HCL 1 MG/ML IJ SOLN (WRAP)
INTRAMUSCULAR | Status: AC | PRN
Start: 2021-12-12 — End: 2021-12-12
  Administered 2021-12-12: 1 mg via INTRAVENOUS

## 2021-12-12 MED ORDER — LIDOCAINE HCL 1 % IJ SOLN
INTRAMUSCULAR | Status: AC | PRN
Start: 2021-12-12 — End: 2021-12-12
  Administered 2021-12-12: 2 mL via SUBCUTANEOUS

## 2021-12-12 SURGICAL SUPPLY — 21 items
6FR X 125CM PERFORMA DIAGNOSTIC CATHETER, JUDKINS LEFT, JL4.0, 1.9CM TIP, 0.038IN MAX GUIDEWIRE (Catheter, Diagnostic Imaging - Angiographic) ×1 IMPLANT
CATHETER ANGIO VESTAN SS JL3.5 CRV INFNT (Catheter Micellaneous) ×1
CATHETER ANGIO VESTAN SS JR4 CRV INFNT 4 (Catheter Micellaneous) ×1
CATHETER OD4 FR L100 CM LARGE INNER (Catheter Micellaneous) ×2
CATHETER OD4 FR L100 CM LARGE INNER LUMEN RADIOPAQUE JL3.5 CURVE (Catheter Miscellaneous) IMPLANT
CATHETER OD4 FR L100 CM LARGE INNER LUMEN RADIOPAQUE JR4 CURVE (Catheter Miscellaneous) IMPLANT
CATHETER PA PLMR SG CNTRCTH 6FR 110CM (Catheter)
CATHETER PULMONARY ARTERY L110 CM (Catheter)
CATHETER PULMONARY ARTERY L110 CM THERMODILUTION C TIP OD6 FR (Catheter) IMPLANT
DEVICE CMPR REG TR BAND 24CM 2 BLN TRNS (Lab Supplies) ×1
DEVICE COMPRESSION L24 CM REGULAR 2 (Lab Supplies) ×1
DEVICE COMPRESSION L24 CM REGULAR 2 BALLOON TRANSPARENT ADJUSTABLE (Lab Supplies) IMPLANT
KIT INTRO SS HDRPH .021IN 35MM FLX STRG (Sheaths) ×1
KIT INTRODUCER L10 CM .021 IN L35 MM (Sheaths) ×1
KIT INTRODUCER L10 CM .021 IN L35 MM FLEX STRAIGHT SHEATH DILATOR (Sheaths) IMPLANT
SHEATH INTRO SS HDRPH PTFE PNCL 4FR 10CM (Introducer)
SHEATH INTRODUCER L10 CM L2.5 CM ID4 FR SNAP ON DILATOR LOCK KINK (Introducer) IMPLANT
SHEATH INTRODUCER L10 CM SNAP ON DILATOR (Introducer)
SYSTEM INTRO NTNL REG SLTGHT 6FR ECHGN (Introducer)
SYSTEM INTRODUCER ECHOGENIC NEEDLE FIX (Introducer)
SYSTEM INTRODUCER ECHOGENIC NEEDLE FIX CORE GUIDEWIRE SEAL-TIGHT (Introducer) IMPLANT

## 2021-12-12 NOTE — Progress Notes (Signed)
H&P UPDATE WITH ASA/MALLAMPATTI    Date Time: 12/12/21 7:26 AM    PROCEDURE:    Left heart cath, possible percutaneous coronary intervention  INDICATIONS:    abnormal stress test    26 yoM with a history of chest pain and a strong family history of CAD, positive exercise SPECT with inferolateral ischemia, normal EF who is presenting for diagnostic coronary angiogram +/- PCI.   H&P:    The history and physical including past medical, family, and social history were reviewed   and there are no significant interval changes from what is currently available in the chart  from prior evaluation. He has no complaints.  He was seen and examined by me prior   to the procedure.   ALLERGIES:    Patient has no known allergies.   LABS:      Lab Results   Component Value Date    WBC 10.32 (H) 12/11/2021    HGB 15.6 12/11/2021    HCT 43.9 12/11/2021    PLT 235 12/11/2021    NA 129 (L) 12/11/2021    K 4.9 12/11/2021    CL 96 (L) 12/11/2021    CO2 24 12/11/2021    MG 1.8 10/05/2021    BUN 8.0 (L) 12/11/2021    CREAT 1.0 12/11/2021    EGFR >60.0 12/11/2021    GLU 93 12/11/2021     ASA PHYSICAL STATUS    Class 3 - Severe systemic disease, limits normal activity but not incapacitating  MALLAMPATTI AIRWAY CLASSIFICATION    Class III: Soft and hard palate and base of the uvula are visible  ACC BLEEDING RISK SCORE   Total Score: 25 = INTERMEDIATE RISK for bleeding (1.1%  to 3.1%)  PLANNED SEDATION:    ( ) NO SEDATION   (x) MODERATE SEDATION   ( ) DEEP SEDATION WITH ANESTHESIA   CONCLUSION:    The risks, benefits and alternatives of the procedure have been discussed in detail and   he has indicated that he understands the procedure, indications, and risks inherent to the   procedure and is amenable to proceeding.  All questions were answered. Informed   consent was signed and verified.      Signed by: Art Buff, MD

## 2021-12-12 NOTE — Procedures (Signed)
CARDIAC CATH LAB REPORT    PROCEDURES PERFORMED  Diagnostic Coronary Angiogram  LHC    LOCATION  [place]    INDICATION  Chest pain  Positive stress test    PRE-PROCEDURE DIAGNOSIS  Chest pain  Positive stress test     POST-PROCEDURE DIAGNOSIS  Chest pain  Positive stress test  Significant 2VD  RCA 100% proximal CTO  OM1 70%  No gradient across aortic valve  LVEDP 8 mmHg  Right radial artery spasm       DESCRIPTION OF PROCEDURE    After informed consent was obtained, electrocardiographic and hemodynamic monitoring was established.    The patient was prepped and draped in the usual sterile fashion.    Subcutaneous lidocaine was administered over the access site.    DIAGNOSTIC CATH  Access:  Right radial artery. Front-wall puncture technique. 40F Terumo slender glidesheath.    Anti-spasmotics: 200 mcg nitro were administered intra-arterially through the sheath. Radial artery spasm during the case. Strongly consider groins for next procedures.     0.035" J-tipped wire was used to advance and exchange diagnostic catheters.    The coronary ostia were selectively engaged and cineangiography was performed in a variety of axial and hemi-axial projections.    The aortic valve was crossed. Left ventricle and aortic pressures were obtained.    Diagnostic catheters used: JR4, JL 4    All equipment was removed. The patient tolerated the procedure well with no complications.    Hemostasis: Achieved. TR Band.  Contrast (mL): 70 mL  EBL (mL): <5 mL  Sedation: Fentanyl and Versed    FINDINGS    Left Heart Catheterization  LV 117/1/8, EDP 8 mmHg  AO 113/64/84 mmHg    Coronary Angiography  Right dominant system  Left Main: Large and patent. Bifurcates into the left anterior descending and left circumflex coronary arteries.   Left anterior descending: Ostial LAD with 40% disease. Proximal into the mid vessel with diffuse 40-50% disease. Distal vessel with mild diffuse disease. D1 small and patent.   Left Circumflex:  Proximal vessel patent leading into a branching OM1. Superior branch of OM1 with 70% proximal disease. Inferior branch patent. OM2 patent.   Right: Proximal RCA CTO with blunt cap. Receives collaterals from a large septal and tortuous epicardial collateral with high frequency turns. The circumflex also provides collaterals to the right coronary artery.      SUMMARY  1 yoM with a history of chest pain and a strong family history of CAD, positive exercise SPECT with inferolateral ischemia, normal EF who is presenting for diagnostic coronary angiogram +/- PCI.   Significant 2VD  RCA 100% proximal CTO  OM1 70%  No gradient across aortic valve  LVEDP 8 mmHg  Right radial artery spasm however was able to complete diagnostic portion of the case.   TR Band applied to RRA.  Complications: None    RECOMMENDATIONS    TR Band protocol  Disposition: Home today  Hemostasis: Achieved. TR Band.   IV Fluid: Normal Saline 75 ml/hr x 3 hours  Medications:  Aspirin 81 mg daily  Statin 40 mg QHS   Metoprolol succinate 25 mg daily  Will increase imdur from 30 mg to 60 mg daily  Will add ranexa 500 mg BID  Maximize secondary prevention and risk factor modification  Follow up: Will plan to see myself in clinic to discuss CTO PCI risks/benefits and then will set up for RCA CTO PCI.   Plan will be to attempt RCA  CTO PCI and if successful +/- circumflex at same setting. Medical therapy for LAD moderate diffuse disease.     The findings were discussed with the patient and/or family. All questions were answered.      Art Buff, MD  [facility], Interventional Cardiology

## 2021-12-12 NOTE — UM Notes (Signed)
Payor Berkley Harvey is for: NPR OP   Review for DOS: 12/12/21 OP   Direct # Call back to Marsena Taff: [phone]    Patient presents as direct admit for planned procedure and was transferred to surgical unit.    INDICATIONS:    abnormal stress test  POST PROCEDURE DEBRIEF:    Left heart cath    This clinical review is based on/compiled from documentation provided by the treatment team within the patient's medical record.

## 2021-12-12 NOTE — Progress Notes (Signed)
Arrived from home        Admit to [facility] for LHC with Dr. Merilyn Baba  Patient ID verified  Accompanied by Mother  NPO status verified  Call bell in reach  Pre-procedure checklist complete  Need for bedrest and limb restrictions explained     H&P- in EMR  Consents- in EMR     LHC Teaching and Learning Objectives   Learner: Janine Ores  Preference for learning: Verbal  Teaching Method: Verbal Instruction   Outcome of Learning: Fully Achieved     Described/Demonstrated the following:      + Responsibilities of patient's care  + Purpose of procedure  + Need to be NPO pre-procedure  + Need for maintaining bedrest & straight leg post-procedure   + Necessary fluid intake after procedure  + Symptoms of bleeding & states plan to notify nurse

## 2021-12-12 NOTE — Progress Notes (Signed)
CATH LAB PROCEDURE HANDOFF REPORT    Date Time: 12/12/21 9:41 AM    INDICATIONS:    abnormal stress test  POST PROCEDURE DEBRIEF:    Left heart cath  Per MD:  Key concerns for recovery/management: bleeding/hematoma  ALLERGIES:    Patient has no known allergies.   ACC BLEEDING RISK SCORE   Total Score: 25 = INTERMEDIATE RISK for bleeding (1.1%  to 3.1%)   MEDICAL HISTORY:      Past Medical History:   Diagnosis Date    Chest pain 12/10/2021    COVID 09/2021    11/2021: asymptomatic    Hearing loss in Left ear     History of echocardiogram 10/05/2021    EF 54    History of nuclear stress test: exercise SPECT abn 11/22/2021    Hyperlipidemia     on Rx    TBI (traumatic brain injury) 1999      ACCESS:    36F sheath in right radial artery  Hemostasis: TR band, 10 mL of air at 1015  Post procedure pulses: palpable in right arm/hand  Visual appearance: clean/dry/intact with good distal pulses   MEDICATIONS:    Lidocaine subQ  Versed: 5 mg IV  Fentanyl: 175 mcg IV  Heparin:  4,000 units IV  Nitroglycerin: 200 mcg IA  VITALS:    HR:62           Rhythm: sinus rhythm       BP: 114/72   O2 SAT: 98% RA  PROCEDURE DETAILS:    Outcomes: See physician note                                Last ACT: No results found for: ISTATACTKAOL     Final Chest Pain Assessment:0/10    Report given to: Burnadette Peter RN     See Physicians Op note/ Report for details

## 2021-12-12 NOTE — Progress Notes (Signed)
Received report and patient into [facility] 26 s/p LHC. Assumed patient's care. Bedside handoff completed with RCIS, ID band verified, alert and oriented x 3, VSS, aldrete 10, pain 0 /10, rhythm NSR. Pt on room air. Call bell within reach. Oriented pt to to room. 10-15 mins assessment/rounding implemented. Site Checks/Vitals/Pain/neuro assessment/Pulses checked per protocol. Educated patient on site precautions including but not limited to: bed rest, right wrist precautions, splint,  arm support by the pillow, TR band deflation time. Pt verbalized understanding of all education.

## 2021-12-12 NOTE — Progress Notes (Signed)
Pt remains stable, assisted oob ambulated in the halls and voided. Right radial puncture site CDI, no bleeding/hematoma, PP palpable. Discharge instruction reviewed with patient including, medication, activity, diet, return visit to MD. Patient verbalized understanding. A copy of instructions was given to patient. At 1319 pt was discharged home in stable condition.

## 2021-12-12 NOTE — Discharge Instr - AVS First Page (Addendum)
Interventional Cardiovascular Admission and Recovery  Catheterization Discharge Instructions            Access Site: Radial Artery    Activity:  Do not lift anything greater than five (5) pounds and no strenuous activity for 24 hours.  Avoid weight bearing in the affected arm and prevent flexion, extension and manipulation of the wrist area for at least 24 hours unless otherwise instructed by the physician   No driving for 24 hours following your procedure.  Ask your doctor when you should return to work. Usually you can return within 48 hours for desk jobs and within 3 (three) to five (5) days for jobs requiring heavy labor. If you had a myocardial infarction (i.e. heart attack), your physician may request that you take a longer period off work.    Drink 6-8 glasses of water for at least the next two (2) days to help flush your body of the contrast used during the procedure.    Gradually increase your level of activity back to normal depending on how you feel.   You are encouraged to walk as much as you feel up to it, walking is a great way to increase your endurance. Remember to allow for rest periods.       Access Site Care:  You may shower 24 hours after your procedure.  Leave the bandage in place and let the water passively flow over the site.  You may shower daily.   After 24 hours REMOVE the dressing before or during your shower.  Again, let the water passively flow over the site, wash gently with mild soap and water using your hand, then pat the area dry.    Do not submerge access site in water (dishwashing, tub bath, pool, etc) until completely healed (usually five (5) days).   Do not rub, pick or scratch the area.   Do not apply creams, powders, lotions, or ointments to the site.   Observe for signs of infection:  redness, warmth, swelling, drainage, or temperature greater than 100.4 degrees F.  If you suspect infection call the doctor who performed the procedure.  Monitor for bleeding and swelling. If  either occurs, sit or lie down, apply manual pressure directly over the access and call the doctor who performed the procedure.     Normal Observation:  You may feel tenderness at the puncture site.  May take ACETAMINOPHEN (TYLENOL) if needed.  May also use ice and elevation for site discomfort.  You may experience some mild bruising.      If you received procedural sedation:  Procedural sedation is medicine to ease discomfort, pain, and anxiety during a procedure. The medicine is often given through an IV (intravenous) line in your arm or hand. In some cases, the medicine may be taken by mouth or inhaled. While you are under sedation, you will likely be in a dream-like state so you may not remember anything afterward.   Why procedural sedation is used  Sedation is used for many types of procedures. The goal is to reduce pain, anxiety, and stressful memories of a procedure. It can help your healthcare provider treat you.   Risks of procedural sedation  Risks and possible side effects include:   Headache  Nausea and vomiting  Lowered rate of breathing  Changes in heart rate and blood pressure (rare)  Side effects will likely go away shortly after the procedure. Your healthcare team will watch your heart rate and breathing during and after your   sedation. This is to help prevent problems.   Your own risks may vary. They can be based on your age and your overall health. They also depend on the type of sedation you are given. Talk with your healthcare provider about the risks that apply most to you.     Call 9-1-1 if:  If you are unable to stop the bleeding with manual pressure. An arterial bleed may become an emergency if left unattended.   Your fingers/hand/arm has a loss of normal sensation, becomes cold, numb, painful or grayish in color.   You are experiencing unrelieved chest pain.    Cardiac Rehab   Your cardiologist may want you to go to cardiac rehab to help you recover. Cardiac rehab is a program that will help  you get stronger and healthier. It also teaches you to make healthy lifestyle changes such as exercising and eating a heart-healthy diet. To enroll, cardiac rehabilitation program will contact you after receiving your referral.     Follow up:  Schedule your follow-up appointment with your doctor.

## 2021-12-13 ENCOUNTER — Encounter: Payer: Self-pay | Admitting: Cardiovascular Disease

## 2021-12-13 ENCOUNTER — Telehealth (INDEPENDENT_AMBULATORY_CARE_PROVIDER_SITE_OTHER): Payer: Self-pay

## 2021-12-13 NOTE — Telephone Encounter (Signed)
Patient called to ask if there is a less expensive version of Ranexa.  It was prescribed by Dr Elder Love yesterday post cath.  He was told it would cost $202.  He was not sure if that is for a 30 or 90 day supply.  He thinks he can swing it if it's 90 day.  I advised him to look into [employer] and apply any discounts that way.  He verbalized understanding and will call back if he still has an issue with the cost.

## 2021-12-18 ENCOUNTER — Other Ambulatory Visit (INDEPENDENT_AMBULATORY_CARE_PROVIDER_SITE_OTHER): Payer: Self-pay | Admitting: Cardiovascular Disease

## 2021-12-18 DIAGNOSIS — I2582 Chronic total occlusion of coronary artery: Secondary | ICD-10-CM

## 2021-12-19 ENCOUNTER — Encounter: Payer: Self-pay | Admitting: Cardiovascular Disease

## 2022-01-04 NOTE — Progress Notes (Signed)
[facility] OFFICE CONSULTATION NOTE    HRT [facility] OFFICE      [facility] OFFICE -CARDIOLOGY  [address]  [place] [address]  Dept: [phone]  Dept Fax: [phone]         Patient Name: Erik [name]    Date of Visit:  January 06, 2022  Date of Birth: [birth year]  AGE: 46 y.o.  Medical Record #: [id number]  Requesting Physician: PCP None, MD    CHIEF COMPLAINT:  Follow-up and Shortness of Breath      HISTORY OF PRESENT ILLNESS    Erik [name] is being seen today for cardiovascular evaluation at the request of PCP None, MD. He is a pleasant 46 y.o. 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 Patient presented to the ED in 09/2021 with chest pain and workup with exercise SPECT showing inferior and inferolateral wall ischemia led to a coronary angiogram in early March 2023 revealing 2VD. Patient was optimized on medications including a BB, ranexa and imdur, with persistent CCS 3 angina and dyspnea. Patient was therefore referred for CTO PCI consideration.     Today patient reports that he is still having chest pain and SOB. He is still on the Imdur, Metoprolol and Ranexa with persistent symptoms. He is very interested in getting the procedure done so he can finally feel better.     No other acute complaints today.     He is cutting down on smoking and now makes 1 pack last 2 days (vs 1).     PAST MEDICAL HISTORY: He has a past medical history of Chest pain (12/10/2021), COVID (09/2021), Hearing loss in Left ear, History of echocardiogram (10/05/2021), History of nuclear stress test: exercise SPECT abn (11/22/2021), Hyperlipidemia, and TBI (traumatic brain injury) (1999). He has a past surgical history that includes Dental surgery and LHC w/ Coronary Angios and LV (Left, 12/12/2021).    ALLERGIES: No Known Allergies    MEDICATIONS:   Current Outpatient Medications:      aspirin 81 MG chewable tablet, Chew 1 tablet (81 mg) by mouth daily (Patient taking differently: Chew 81 mg by mouth every morning), Disp: 90 tablet, Rfl: 3    atorvastatin (LIPITOR) 40 MG tablet, Take 1 tablet (40 mg) by mouth nightly, Disp: 90 tablet, Rfl: 3    isosorbide mononitrate (IMDUR) 30 MG 24 hr tablet, Take 2 tablets (60 mg) by mouth daily, Disp: 180 tablet, Rfl: 3    metoprolol succinate XL (TOPROL-XL) 25 MG 24 hr tablet, Take 1 tablet (25 mg) by mouth daily (Patient taking differently: Take 25 mg by mouth every morning), Disp: 90 tablet, Rfl: 3    ranolazine (RANEXA) 500 MG 12 hr tablet, Take 2 tablets (1,000 mg) by mouth 2 (two) times daily, Disp: 180 tablet, Rfl: 3   Patient's current medications were reviewed. ONLY Cardiac medications were updated unless others were addressed in assessment and plan.    FAMILY HISTORY: family history includes COPD in his brother; Diabetes in his brother; Heart attack in his father and maternal grandfather; Hyperlipidemia in his sister; Hypertension in his sister; Hypothyroidism in his mother.    SOCIAL HISTORY: He reports that he has been smoking cigarettes. He has a 40.00 pack-year smoking history. He has never used smokeless tobacco. He reports current alcohol use of about 66.0 standard drinks of alcohol per week. He reports  that he does not use drugs.    REVIEW OF SYSTEMS:   [name]: Denies recent weight loss, weight gain, fever or chills or change in exercise tolerance.;   Integumentary: Denies any change in hair or nails, rashes, or skin lesions.;   Eyes: Denies diplopia, glaucoma or visual field defects.;   Ears, Nose, Throat, Mouth: Denies any hearing loss, epistaxis, hoarseness or difficulty speaking.;  Respiratory: Denies dyspnea, cough, wheezing or hemoptysis.;   Cardiovascular: Please review HPI;   Abdominal : Denies ulcer disease, hematochezia or melena.;  Musculoskeletal:Denies any venous insufficiency, arthritic symptoms or back problems.;    Neurological : Denies any recurrent strokes, TIA, or seizure disorder.;   Psychiatric: Denies any depression, substance abuse or change in cognitive functions.;   Endocrine: Denies any weight change, heat/cold intolerance, polydipsia, or polyuria;   Hematologic/Immunologic: Denies any food allergies, seasonal allergies, bleeding disorders.   All other systems reviewed and negative except as above.       PHYSICAL EXAMINATION    Visit Vitals  BP 120/74 (BP Site: Left arm, Patient Position: Sitting, Cuff Size: Medium)   Pulse 70   Wt 84.4 kg (186 lb)   BMI 23.88 kg/m        [name] Appearance:  A well-appearing male in no acute distress.    Skin: Warm and dry to touch, no apparent skin lesions, or masses noted.  Head: Normocephalic, normal hair pattern, no masses or tenderness   Eyes: EOMS Intact, PERRL, conjunctivae and lids unremarkable.  Funduscopic exam and visual fields not performed.   ENT: Ears, Nose and throat reveal no gross abnormalities.  No pallor or cyanosis.  Dentition good.   Neck: JVP normal, no carotid bruit, thyroid not enlarged   Chest: Clear to auscultation bilaterally with good air movement and respiratory effort and no wheezes, rales, or rhonchi   Cardiovascular: Regular rhythm, S1 normal, S2 normal, No S3 or S4. Apical impulse not displaced. No murmur. No gallops or rubs detected   Abdomen: Soft, nontender, nondistended, with normoactive bowel sounds. No organomegaly.  No pulsatile masses, or bruits.   Extremities: Warm without edema, clubbing, or cyanosis. All peripheral pulses are full and equal.   Neuro: Alert and oriented x3. No gross motor or sensory deficits noted, affect appropriate.      LABS:   Lab Results   Component Value Date    WBC 10.32 (H) 12/11/2021    HGB 15.6 12/11/2021    HCT 43.9 12/11/2021    PLT 235 12/11/2021     Lab Results   Component Value Date    GLU 93 12/11/2021    BUN 8.0 (L) 12/11/2021    CREAT 1.0 12/11/2021    NA 129 (L) 12/11/2021    K 4.9 12/11/2021    CL 96  (L) 12/11/2021    CO2 24 12/11/2021    CA 9.7 12/11/2021    PROT 7.0 10/04/2021    AST 19 10/04/2021    ALT 13 10/04/2021    BILITOTAL 0.4 10/04/2021    GLOB 3.0 10/04/2021    ALB 4.0 10/04/2021     Lab Results   Component Value Date    MG 1.8 10/05/2021    TSH 0.80 10/05/2021    HGBA1C 5.0 10/05/2021     Lab Results   Component Value Date    CHOL 130 10/05/2021    TRIG 46 10/05/2021    HDL 49 10/05/2021    LDL 72 10/05/2021    LABVLDL 9 (L) 10/05/2021  CHOLHDLRATIO 2.7 10/05/2021         IMPRESSION:   Erik [name] is a 46 y.o. male with the following problems:      Chest pain with both typical (elephant on chest) and atypical (reproducible on exam with palpation of chest) features, with serial hs-troponins negative despite many hours of pain (8 hs continuous prior to ED presentation)  CAD on the basis of coronary calcifications seen incidentally on CT chest 12/23 that ruled out dissection, central PE   Family history of CAD father's fatal MI at age 10  Patient remains hemodynamically and electrically stable  EKG does not meet STEMI criteria   HS trop negative  D-Dimer slightly elevated  Discharged home on aspirin, statin, Imdur and BB   Exercise SPECT 11/2021 showing inferior and inferolateral wall ischemia.  Coronary angiogram with 2VD (OM1 70%, RCA CTO)  TTE 10/05/21 EF 54%, normal diastolic fxn, no sig valve dz    COVID detected on nasal swab, largely asymptomatic, no oxygen requirements  Tobacco use 1 ppd smoked (prior 3-4 ppd for 20+ years), and 2 cans of dip tobacco every three days  Daily EtOH 6-pack beer and more on weekends. He now is having 2 cases of beer a week.   Limited medical follow up- has not seen an outpatient doctor for 10 years     RECOMMENDATIONS:    CAD with RCA CTO and 70% OM1 disease. TTE with normal EF 09/2021.   Patient with CCS 3 angina and dyspnea on 3 anti-anginals. Will increase Ranexa from 500 mg Bid to 1000 mg BID.   He is scheduled for RCA CTO PCI on 01/23/2022. I explained the  indication for the CTO PCI, which is primarily for continued anginal symptoms despite the use of antianginals as tolerated.  There may be additional benefit in the setting of planned complete revascularization, which has been shown to improve outcomes, or if a large area of ischemic myocardium is supplied by the CTO vessel, particularly if this matches the area identified on noninvasive stress imaging, but the data is less robust for single-vessel PCI.  We also discussed procedural details including the potential need for multiple access sites, as well as staged procedures to complete the intervention, and discussed the risks, which include but are not limited to a 5-7% risk of major adverse cardiovascular events such as: major bleeding, vascular injury, kidney injury, myocardial infarction, stroke, coronary perforation, need for emergent surgery, and death.  I reviewed not only the risks identified in the literature, but also from my own personal log of cases which showed a risk of death well under 1%.  There will also likely be a need for prolonged dual anti-platelet therapy following CTO PCI. All questions were answered. We even group texted his family members and answered their questions.   Plan will be bifemoral access 58F. He had severe radial artery spasm during the diagnostic cath. Plan will be RCA CTO PCI attempt +/- Circumflex PCI in same procedure based on how Erik [name] is doing, time, contrast and Xray use. If needed we will loop in surgeons pending on RCA CTO PCI success. Plan will be AWE first -> ADR (decent landing zone above the RCA bifurcation) -> Retrograde with S1 having decent connections.   Tobacco use: 1 ppd smoked (prior 3-4 ppd for 20+ years), and 2 cans of dip tobacco every three days. He has now cut down to 1 pack every 2 days. Encouraged additional smoking cessation.     RTC 1  month or sooner if needed. RCA CTO PCI +/- Left circumflex PCI 01/23/2022.                                                 Orders Placed This Encounter   Procedures    Office Visit (HRT [place])       Orders Placed This Encounter   Medications    ranolazine (RANEXA) 500 MG 12 hr tablet     Sig: Take 2 tablets (1,000 mg) by mouth 2 (two) times daily     Dispense:  180 tablet     Refill:  3         SIGNED:    Art Buff, MD         This note was generated by the Dragon speech recognition and may contain errors or omissions not intended by the user. Grammatical errors, random word insertions, deletions, pronoun errors, and incomplete sentences are occasional consequences of this technology due to software limitations. Not all errors are caught or corrected. If there are questions or concerns about the content of this note or information contained within the body of this dictation, they should be addressed directly with the author for clarification.

## 2022-01-06 ENCOUNTER — Encounter (INDEPENDENT_AMBULATORY_CARE_PROVIDER_SITE_OTHER): Payer: Self-pay | Admitting: Cardiovascular Disease

## 2022-01-06 ENCOUNTER — Ambulatory Visit (INDEPENDENT_AMBULATORY_CARE_PROVIDER_SITE_OTHER): Payer: BC Managed Care – PPO | Admitting: Cardiovascular Disease

## 2022-01-06 DIAGNOSIS — I2582 Chronic total occlusion of coronary artery: Secondary | ICD-10-CM

## 2022-01-06 MED ORDER — RANOLAZINE ER 500 MG PO TB12
1000.0000 mg | ORAL_TABLET | Freq: Two times a day (BID) | ORAL | 3 refills | Status: DC
Start: 2022-01-06 — End: 2022-02-10

## 2022-01-07 ENCOUNTER — Encounter (INDEPENDENT_AMBULATORY_CARE_PROVIDER_SITE_OTHER): Payer: Self-pay

## 2022-01-07 ENCOUNTER — Emergency Department: Payer: BC Managed Care – PPO

## 2022-01-07 ENCOUNTER — Observation Stay
Admission: EM | Admit: 2022-01-07 | Discharge: 2022-01-09 | Disposition: A | Discharge status: Home / Self Care | Payer: BC Managed Care – PPO | Attending: Internal Medicine | Admitting: Internal Medicine

## 2022-01-07 ENCOUNTER — Telehealth (INDEPENDENT_AMBULATORY_CARE_PROVIDER_SITE_OTHER): Payer: Self-pay

## 2022-01-07 DIAGNOSIS — I25118 Atherosclerotic heart disease of native coronary artery with other forms of angina pectoris: Secondary | ICD-10-CM | POA: Insufficient documentation

## 2022-01-07 DIAGNOSIS — Z8249 Family history of ischemic heart disease and other diseases of the circulatory system: Secondary | ICD-10-CM | POA: Insufficient documentation

## 2022-01-07 DIAGNOSIS — R079 Chest pain, unspecified: Secondary | ICD-10-CM | POA: Diagnosis present

## 2022-01-07 DIAGNOSIS — Z79899 Other long term (current) drug therapy: Secondary | ICD-10-CM | POA: Insufficient documentation

## 2022-01-07 DIAGNOSIS — R42 Dizziness and giddiness: Secondary | ICD-10-CM | POA: Insufficient documentation

## 2022-01-07 DIAGNOSIS — I10 Essential (primary) hypertension: Secondary | ICD-10-CM | POA: Insufficient documentation

## 2022-01-07 DIAGNOSIS — R9431 Abnormal electrocardiogram [ECG] [EKG]: Secondary | ICD-10-CM

## 2022-01-07 DIAGNOSIS — E785 Hyperlipidemia, unspecified: Secondary | ICD-10-CM | POA: Insufficient documentation

## 2022-01-07 DIAGNOSIS — F1020 Alcohol dependence, uncomplicated: Secondary | ICD-10-CM | POA: Insufficient documentation

## 2022-01-07 DIAGNOSIS — Z7982 Long term (current) use of aspirin: Secondary | ICD-10-CM | POA: Insufficient documentation

## 2022-01-07 DIAGNOSIS — F17210 Nicotine dependence, cigarettes, uncomplicated: Secondary | ICD-10-CM | POA: Insufficient documentation

## 2022-01-07 DIAGNOSIS — Z955 Presence of coronary angioplasty implant and graft: Secondary | ICD-10-CM | POA: Insufficient documentation

## 2022-01-07 DIAGNOSIS — I2510 Atherosclerotic heart disease of native coronary artery without angina pectoris: Secondary | ICD-10-CM | POA: Insufficient documentation

## 2022-01-07 DIAGNOSIS — E663 Overweight: Secondary | ICD-10-CM | POA: Insufficient documentation

## 2022-01-07 DIAGNOSIS — I2582 Chronic total occlusion of coronary artery: Secondary | ICD-10-CM | POA: Insufficient documentation

## 2022-01-07 DIAGNOSIS — Z6825 Body mass index (BMI) 25.0-25.9, adult: Secondary | ICD-10-CM | POA: Insufficient documentation

## 2022-01-07 DIAGNOSIS — Z8616 Personal history of COVID-19: Secondary | ICD-10-CM | POA: Insufficient documentation

## 2022-01-07 DIAGNOSIS — I25110 Atherosclerotic heart disease of native coronary artery with unstable angina pectoris: Principal | ICD-10-CM | POA: Insufficient documentation

## 2022-01-07 DIAGNOSIS — E871 Hypo-osmolality and hyponatremia: Secondary | ICD-10-CM | POA: Insufficient documentation

## 2022-01-07 LAB — HIGH SENSITIVITY TROPONIN-I WITH DELTA
hs Troponin-I Delta: UNDETERMINED ng/L
hs Troponin-I: <2.7 ng/L

## 2022-01-07 LAB — CBC AND DIFFERENTIAL
Absolute NRBC: 0 10*3/uL (ref 0.00–0.00)
Basophils Absolute Automated: 0.11 10*3/uL — ABNORMAL HIGH (ref 0.00–0.08)
Basophils Automated: 1.3 %
Eosinophils Absolute Automated: 0.94 10*3/uL — ABNORMAL HIGH (ref 0.00–0.44)
Eosinophils Automated: 11.1 %
Hematocrit: 38.6 % (ref 37.6–49.6)
Hgb: 14.2 g/dL (ref 12.5–17.1)
Immature Granulocytes Absolute: 0.04 10*3/uL (ref 0.00–0.07)
Immature Granulocytes: 0.5 %
Instrument Absolute Neutrophil Count: 4.27 10*3/uL (ref 1.10–6.33)
Lymphocytes Absolute Automated: 2.57 10*3/uL (ref 0.42–3.22)
Lymphocytes Automated: 30.4 %
MCH: 32.1 pg (ref 25.1–33.5)
MCHC: 36.8 g/dL — ABNORMAL HIGH (ref 31.5–35.8)
MCV: 87.3 fL (ref 78.0–96.0)
MPV: 8.6 fL — ABNORMAL LOW (ref 8.9–12.5)
Monocytes Absolute Automated: 0.53 10*3/uL (ref 0.21–0.85)
Monocytes: 6.3 %
Neutrophils Absolute: 4.27 10*3/uL (ref 1.10–6.33)
Neutrophils: 50.4 %
Nucleated RBC: 0 /100 WBC (ref 0.0–0.0)
Platelets: 207 10*3/uL (ref 142–346)
RBC: 4.42 10*6/uL (ref 4.20–5.90)
RDW: 12 % (ref 11–15)
WBC: 8.46 10*3/uL (ref 3.10–9.50)

## 2022-01-07 LAB — BASIC METABOLIC PANEL
Anion Gap: 9 (ref 5.0–15.0)
BUN: 7 mg/dL — ABNORMAL LOW (ref 9.0–28.0)
CO2: 23 mEq/L (ref 17–29)
Calcium: 9.2 mg/dL (ref 8.5–10.5)
Chloride: 95 mEq/L — ABNORMAL LOW (ref 99–111)
Creatinine: 0.9 mg/dL (ref 0.5–1.5)
Glucose: 72 mg/dL (ref 70–100)
Potassium: 3.9 mEq/L (ref 3.5–5.3)
Sodium: 127 mEq/L — ABNORMAL LOW (ref 135–145)

## 2022-01-07 LAB — APTT: PTT: 30 s (ref 27–39)

## 2022-01-07 LAB — CBC
Absolute NRBC: 0 10*3/uL (ref 0.00–0.00)
Hematocrit: 38 % (ref 37.6–49.6)
Hgb: 13.8 g/dL (ref 12.5–17.1)
MCH: 31.8 pg (ref 25.1–33.5)
MCHC: 36.3 g/dL — ABNORMAL HIGH (ref 31.5–35.8)
MCV: 87.6 fL (ref 78.0–96.0)
MPV: 8.7 fL — ABNORMAL LOW (ref 8.9–12.5)
Nucleated RBC: 0 /100 WBC (ref 0.0–0.0)
Platelets: 204 10*3/uL (ref 142–346)
RBC: 4.34 10*6/uL (ref 4.20–5.90)
RDW: 12 % (ref 11–15)
WBC: 8.29 10*3/uL (ref 3.10–9.50)

## 2022-01-07 LAB — GFR: EGFR: >60

## 2022-01-07 LAB — ANTI-XA,UFH: Anti-Xa, UFH: 0.04 IU/mL

## 2022-01-07 LAB — HIGH SENSITIVITY TROPONIN-I: hs Troponin-I: <2.7 ng/L

## 2022-01-07 MED ORDER — ONDANSETRON 4 MG PO TBDP
4.0000 mg | ORAL_TABLET | Freq: Four times a day (QID) | ORAL | Status: DC | PRN
Start: 2022-01-07 — End: 2022-01-09

## 2022-01-07 MED ORDER — POTASSIUM CHLORIDE CRYS ER 20 MEQ PO TBCR
0.0000 meq | EXTENDED_RELEASE_TABLET | ORAL | Status: DC | PRN
Start: 2022-01-07 — End: 2022-01-09

## 2022-01-07 MED ORDER — DEXTROSE 10 % IV BOLUS
12.5000 g | INTRAVENOUS | Status: DC | PRN
Start: 2022-01-07 — End: 2022-01-09

## 2022-01-07 MED ORDER — MAGNESIUM OXIDE 400 MG TABS (WRAP)
0.0000 mg | ORAL_TABLET | ORAL | Status: DC | PRN
Start: 2022-01-07 — End: 2022-01-09

## 2022-01-07 MED ORDER — NITROGLYCERIN 0.4 MG SL SUBL
0.4000 mg | SUBLINGUAL_TABLET | SUBLINGUAL | 3 refills | Status: DC | PRN
Start: 2022-01-07 — End: 2022-02-10

## 2022-01-07 MED ORDER — POLYETHYLENE GLYCOL 3350 17 G PO PACK
17.0000 g | PACK | Freq: Every day | ORAL | Status: DC | PRN
Start: 2022-01-07 — End: 2022-01-09

## 2022-01-07 MED ORDER — MAGNESIUM SULFATE IN D5W 1-5 GM/100ML-% IV SOLN
1.0000 g | INTRAVENOUS | Status: DC | PRN
Start: 2022-01-07 — End: 2022-01-09

## 2022-01-07 MED ORDER — METOPROLOL SUCCINATE ER 25 MG PO TB24
25.0000 mg | ORAL_TABLET | Freq: Every day | ORAL | Status: DC
Start: 2022-01-08 — End: 2022-01-09
  Administered 2022-01-08 – 2022-01-09 (×2): 25 mg via ORAL
  Filled 2022-01-07 (×2): qty 1

## 2022-01-07 MED ORDER — RANOLAZINE ER 500 MG PO TB12
1000.0000 mg | ORAL_TABLET | Freq: Two times a day (BID) | ORAL | Status: DC
Start: 2022-01-07 — End: 2022-01-09
  Administered 2022-01-07 – 2022-01-09 (×4): 1000 mg via ORAL
  Filled 2022-01-07 (×4): qty 2

## 2022-01-07 MED ORDER — NALOXONE HCL 0.4 MG/ML IJ SOLN (WRAP)
0.2000 mg | INTRAMUSCULAR | Status: DC | PRN
Start: 2022-01-07 — End: 2022-01-09

## 2022-01-07 MED ORDER — SENNOSIDES-DOCUSATE SODIUM 8.6-50 MG PO TABS
2.0000 | ORAL_TABLET | Freq: Every evening | ORAL | Status: DC | PRN
Start: 2022-01-07 — End: 2022-01-09

## 2022-01-07 MED ORDER — ISOSORBIDE MONONITRATE ER 60 MG PO TB24
60.0000 mg | ORAL_TABLET | Freq: Every day | ORAL | Status: DC
Start: 2022-01-08 — End: 2022-01-08
  Administered 2022-01-08: 09:00:00 60 mg via ORAL
  Filled 2022-01-07: qty 1

## 2022-01-07 MED ORDER — ATORVASTATIN CALCIUM 40 MG PO TABS
40.0000 mg | ORAL_TABLET | Freq: Every evening | ORAL | Status: DC
Start: 2022-01-07 — End: 2022-01-09
  Administered 2022-01-07 – 2022-01-08 (×2): 40 mg via ORAL
  Filled 2022-01-07 (×2): qty 1

## 2022-01-07 MED ORDER — HEPARIN (PORCINE) IN D5W 50-5 UNIT/ML-% IV SOLN (UNITS/KG/HR ONLY)
11.3000 [IU]/kg/h | INTRAVENOUS | Status: DC
Start: 2022-01-07 — End: 2022-01-08
  Administered 2022-01-07: 11.3 [IU]/kg/h via INTRAVENOUS
  Administered 2022-01-08: 08:00:00 14.3 [IU]/kg/h via INTRAVENOUS
  Filled 2022-01-07: qty 500

## 2022-01-07 MED ORDER — GLUCAGON 1 MG IJ SOLR (WRAP)
1.0000 mg | INTRAMUSCULAR | Status: DC | PRN
Start: 2022-01-07 — End: 2022-01-09

## 2022-01-07 MED ORDER — MELATONIN 3 MG PO TABS
3.0000 mg | ORAL_TABLET | Freq: Every evening | ORAL | Status: DC | PRN
Start: 2022-01-07 — End: 2022-01-09

## 2022-01-07 MED ORDER — ACETAMINOPHEN 325 MG PO TABS
650.0000 mg | ORAL_TABLET | Freq: Four times a day (QID) | ORAL | Status: DC | PRN
Start: 2022-01-07 — End: 2022-01-09
  Administered 2022-01-08 (×2): 650 mg via ORAL
  Filled 2022-01-07 (×3): qty 2

## 2022-01-07 MED ORDER — POTASSIUM & SODIUM PHOSPHATES 280-160-250 MG PO PACK
2.0000 | PACK | ORAL | Status: DC | PRN
Start: 2022-01-07 — End: 2022-01-09

## 2022-01-07 MED ORDER — ONDANSETRON HCL 4 MG/2ML IJ SOLN
4.0000 mg | Freq: Four times a day (QID) | INTRAMUSCULAR | Status: DC | PRN
Start: 2022-01-07 — End: 2022-01-09

## 2022-01-07 MED ORDER — ASPIRIN 81 MG PO CHEW
81.0000 mg | CHEWABLE_TABLET | Freq: Every day | ORAL | Status: DC
Start: 2022-01-08 — End: 2022-01-09
  Administered 2022-01-08 – 2022-01-09 (×2): 81 mg via ORAL
  Filled 2022-01-07 (×2): qty 1

## 2022-01-07 MED ORDER — MORPHINE SULFATE 4 MG/ML IJ/IV SOLN (WRAP)
4.0000 mg | Freq: Once | Status: AC
Start: 2022-01-07 — End: 2022-01-07
  Administered 2022-01-07: 4 mg via INTRAVENOUS
  Filled 2022-01-07: qty 1

## 2022-01-07 MED ORDER — NITROGLYCERIN 0.4 MG SL SUBL
0.4000 mg | SUBLINGUAL_TABLET | SUBLINGUAL | Status: DC | PRN
Start: 2022-01-07 — End: 2022-01-09

## 2022-01-07 MED ORDER — CALCIUM CARBONATE ANTACID 500 MG PO CHEW
1000.0000 mg | CHEWABLE_TABLET | Freq: Four times a day (QID) | ORAL | Status: DC | PRN
Start: 2022-01-07 — End: 2022-01-09

## 2022-01-07 MED ORDER — DEXTROSE 50 % IV SOLN
12.5000 g | INTRAVENOUS | Status: DC | PRN
Start: 2022-01-07 — End: 2022-01-09

## 2022-01-07 MED ORDER — ASPIRIN 325 MG PO TABS
325.0000 mg | ORAL_TABLET | Freq: Once | ORAL | Status: AC
Start: 2022-01-07 — End: 2022-01-07
  Administered 2022-01-07: 325 mg via ORAL
  Filled 2022-01-07: qty 1

## 2022-01-07 MED ORDER — GLUCOSE 40 % PO GEL (WRAP)
15.0000 g | ORAL | Status: DC | PRN
Start: 2022-01-07 — End: 2022-01-09

## 2022-01-07 MED ORDER — ACETAMINOPHEN 650 MG RE SUPP
650.0000 mg | Freq: Four times a day (QID) | RECTAL | Status: DC | PRN
Start: 2022-01-07 — End: 2022-01-09

## 2022-01-07 MED ORDER — POTASSIUM CHLORIDE 10 MEQ/100ML IV SOLN
10.0000 meq | INTRAVENOUS | Status: DC | PRN
Start: 2022-01-07 — End: 2022-01-09

## 2022-01-07 NOTE — Plan of Care (Signed)
Shift Note:    Admission Assessment: CTUS     Admitted from: ED    Orientation: A&O x4  Pain: c/o left arm pain denied pain meds   Rhythm on tele: SR-SB  Ambulation: Ind   Lines/Drips: 20g LFA, 18g RAC (Heparin)  GI/GU: Cont x2, LBM 3/28  Code Status: FULL  Fall Score: LOW     Verified patient ID/arm bands. Ensured appropriate safety precautions in place including: assigned fall score interventions, review of known allergies, special needs, personal items within reach, and call bell within reach.      Critical Labs/Images/Procedures:       Comments:  *See doc flow for complete assessment and vitals*     Plan:   - Vitals Q4H  - Monitor HR/Rhythm   - Hep gtt next anti xa 1200  - NPO for Poss cath today  - Pain management     Skin Assessment  Cosign Note: Bernice     Skin WNL EXCEPT for:  HEAD:   LUE:  RUE:   TORSO:   BACK:   SACRUM:  BUTTOCKS/INGUINAL/PERINEAL:  LLE: bunions   RLE:  bunions   OTHER: scattered tattoos and scars

## 2022-01-07 NOTE — Telephone Encounter (Signed)
Sent Nitrostat SL to pt's local [facility].  We discussed when and how to take it.  We discussed ED precautions/ when to call 911.  Pt voiced understanding.

## 2022-01-07 NOTE — Telephone Encounter (Signed)
Erik [name] called reporting that his left arm is sore and he feels "off".  He checked his vitals and it is 141/92, 83 bpm.      He still has intermittent chest pain and DOE same as he reported to Dr. Elder Love.  The new sx is his left arm is sore and he feels that something is wrong.      He is scheduled for a cath on April 13.  He wonders if he should just head to the ED.      Stable during time of call.  He was driving around.     I will consult our AOD.

## 2022-01-07 NOTE — ED Provider Notes (Signed)
[facility]  ATTENDING PHYSICIAN HISTORY AND PHYSICAL EXAM     Patient Name: Erik BlackbirdFROST,Erik [name]  Encounter Date:  01/07/2022  Attending Physician: Roselyn ReefBabak Tommy Goostree MD  Room:  S 17/S 17  Patient DOB:  1976-08-15  Age: 46 y.o. male  MRN:  [id number]  PCP: Marisa SprinklesPcp, None, MD         Diagnosis/Disposition:     Final Impression  Final diagnoses:   Chest pain, unspecified type     Disposition  ED Disposition       ED Disposition   Observation    Condition   --    Date/Time   Tue Jan 07, 2022  8:32 PM    Comment   Admitting Physician: Morton PetersSHI, PETER Y [[id number]]   Service:: Medicine [106]   Estimated Length of Stay: < 2 midnights   Tentative Discharge Plan?: Home or Self Care [1]   Does patient need telemetry?: Yes   Is patient 18 yrs or greater?: Yes   Telemetry type (separate Telemetry order is also required):: Adult telemetry               Follow up  No follow-up provider specified.  Prescriptions  New Prescriptions    No medications on file             MDM:      Number and Complexity of Problems - moderate      Comorbidities impacting treatment: [hypertension, coronary artery disease  ]    Social Determinants of Health that impact treatment or disposition: [None]    MDM Data    Independent historian: [hx per pt ]    External documents reviewed: Performed Chart Reviewed    My EKG interpretation: As interpreted by me:  Time of EKG: 1950  NSR 74 bpm  Non-specific intraventricular delay  Normal intervals  Normal axis  No acute ST or T wave changes    My CT interpretation: See ED Course  Discussed with: See ED Course    Treatment and Disposition    Shared decision making: Performed with patient    Code status: [full code]    ED Course: 2052: Vital stable.  Patient chest pain-free and feels better.  CBC and chemistry unremarkable.  High-sensitivity troponin negative.  Chest x-ray shows no acute pathology.  EKG without signs of acute ischemia.  Chart review shows that patient has a right OM occlusion at 70% as well as  100% occlusion of the RCA.  Patient's was at the With intervention in 2 weeks.  Discussed the case with the hospitalist who agreed to admit patient.  He is requesting IV heparin.  Doubt aortic dissection.  Doubt PE as patient is PERC negative.  Doubt cardiac tamponade.  Doubt esophageal perforation.    The patient's past medical records, including those in Care Everywhere when necessary, were reviewed by me.    This patient was seen and evaluated during the SARS-CoV-2 pandemic.The following personal protective equipment was used by me in the care of this patient: Disposable face mask            History of Presenting Illness:     Nursing Triage note: Pt presents to ED for c/o constant CP x1 day. Pt endorses dizziness, SOB and L arm pain. Pt reports taking two nitro today with minimal releif. Pt reports he is scheduled for cardiac stents on 01/23/22.    Chief complaint: Chest Pain and Dizziness    HPI  Erik BlackbirdJack [name] [name] is a 46  y.o. male w/PMH of 2V CAD (OM1 70% and RCA CTO), TBI, and HLD who presents with gradual onset dull chest pain radiating to L arm. Patient is unsure as to exact onset of symptoms, but describes chest pain a pressure-like sensation that has been gradually worsening with associated SOB.  Took 2 doses of Nitrostat today with minimal relief.  They are scheduled for RCA CTO PCI on 01/23/2022 with  Heart cardiologist Dr. Elder Love, Bertram Denver, MD.  Daily tobacco and EtOH user.  Denies recent traumas or injuries. Pt denies N/V, diaphoresis, leg pain or swelling        Review of Systems:  Physical Exam:     Review of Systems    All other systems reviewed and negative except what is specifically documented above.     Pulse 79  BP (!) 146/92  Resp 17  SpO2 97 %  Temp 98 F (36.7 C)     General: NAD, alert, awake  HENT: Normocephalic, no signs of dehydration  Eyes: EOMI, PERRL, no scleral icterus, no conjunctival pallor  Neck: Normal inspection, no masses, supple, no meningeal signs  Respiration: No  respiratory distress, breath sounds normal, lungs clear   Cardiovascular: RRR, Radial pulses symmetric, pulses normal, heart sounds normal without murmurs  Abdomen: Soft,non-tender, non-distended, no pulsatile masses  Back: Normal inspection, no CVA tenderness  Skin: Normal color, warm, dry, no rash noted  Extremities: Normal appearance, full ROM, no leg pain or swelling   Psych: Mood normal, AOx3  Neuro: Cranial nerves II-XII intact, no gross sensory or motor deficits. Patient ambulating normally.         Diagnostic Results:     Laboratory Studies:    All lab values have been personally reviewed by me    Results       Procedure Component Value Units Date/Time    High Sensitivity Troponin-I at 0 hrs [[id number]] Collected: 01/07/22 1919    Specimen: Blood Updated: 01/07/22 1957     hs Troponin-I <2.7 ng/L     GFR [[id number]] Collected: 01/07/22 1919     Updated: 01/07/22 1949     EGFR >60.0       Basic Metabolic Panel [[id number]]  (Abnormal) Collected: 01/07/22 1919    Specimen: Blood Updated: 01/07/22 1949     Glucose 72 mg/dL      BUN 7.0 mg/dL      Creatinine 0.9 mg/dL      Calcium 9.2 mg/dL      Sodium 086 mEq/L      Potassium 3.9 mEq/L      Chloride 95 mEq/L      CO2 23 mEq/L      Anion Gap 9.0    CBC and differential [[id number]]  (Abnormal) Collected: 01/07/22 1919    Specimen: Blood Updated: 01/07/22 1941     WBC 8.46 x10 3/uL      Hgb 14.2 g/dL      Hematocrit 52.8 %      Platelets 207 x10 3/uL      RBC 4.42 x10 6/uL      MCV 87.3 fL      MCH 32.1 pg      MCHC 36.8 g/dL      RDW 12 %      MPV 8.6 fL      Instrument Absolute Neutrophil Count 4.27 x10 3/uL      Neutrophils 50.4 %      Lymphocytes Automated 30.4 %      Monocytes 6.3 %  Eosinophils Automated 11.1 %      Basophils Automated 1.3 %      Immature Granulocytes 0.5 %      Nucleated RBC 0.0 /100 WBC      Neutrophils Absolute 4.27 x10 3/uL      Lymphocytes Absolute Automated 2.57 x10 3/uL      Monocytes Absolute Automated 0.53 x10 3/uL       Eosinophils Absolute Automated 0.94 x10 3/uL      Basophils Absolute Automated 0.11 x10 3/uL      Immature Granulocytes Absolute 0.04 x10 3/uL      Absolute NRBC 0.00 x10 3/uL             Radiology Studies:    All images have been personally viewed by me    XR Chest  AP Portable   Final Result         1. No acute disease.      Kennyth Lose, MD   01/07/2022 7:52 PM              Interpretations, Clinical Decision Tools and Critical Care:     O2 Sat:  The patient's oxygen saturation was 97 % on room air. This was independently interpreted by me as Normal.     Cardiac Monitoring: I independely reviewed and interpreted the patient's cardiac monitoring as normal sinus at 79.        Procedures:   Procedures        Orders Placed During This Visit:     Encounter Orders:  Orders Placed This Encounter   Procedures    XR Chest  AP Portable    CBC and differential    Basic Metabolic Panel    High Sensitivity Troponin-I at 0 hrs    High Sensitivity Troponin-I at 2 hrs with calculated Delta    GFR    ECG 12 Lead    Adult Admit to Observation       Encounter Medications:  Medications   aspirin tablet 325 mg (325 mg Oral Given 01/07/22 2004)   morphine injection 4 mg (4 mg Intravenous Given 01/07/22 2004)           Allergies & Medications:     Allergies:  Hehas No Known Allergies.    Home Medications       Med List Status: In Progress Set By: Lucienne Capers, RN at 01/07/2022  7:21 PM              aspirin 81 MG chewable tablet     Chew 1 tablet (81 mg) by mouth daily     Patient taking differently: Chew 81 mg by mouth every morning     atorvastatin (LIPITOR) 40 MG tablet     Take 1 tablet (40 mg) by mouth nightly     isosorbide mononitrate (IMDUR) 30 MG 24 hr tablet     Take 2 tablets (60 mg) by mouth daily     metoprolol succinate XL (TOPROL-XL) 25 MG 24 hr tablet     Take 1 tablet (25 mg) by mouth daily     Patient taking differently: Take 25 mg by mouth every morning     nitroglycerin (NITROSTAT) 0.4 MG SL tablet     Place 1 tablet  (0.4 mg) under the tongue every 5 (five) minutes as needed for Chest pain May repeat up to 3x.  CALL 911 FOR UNRELIEVED CHEST PAIN     ranolazine (RANEXA) 500 MG 12 hr tablet  Take 2 tablets (1,000 mg) by mouth 2 (two) times daily                 Past History:     Medical:   Past Medical History:   Diagnosis Date    Chest pain 12/10/2021    COVID 09/2021    11/2021: asymptomatic    Hearing loss in Left ear     History of echocardiogram 10/05/2021    EF 54    History of nuclear stress test: exercise SPECT abn 11/22/2021    Hyperlipidemia     on Rx    TBI (traumatic brain injury) 1999       Surgical: He has a past surgical history that includes Dental surgery and LHC w/ Coronary Angios and LV (Left, 12/12/2021).    Family:   Family History   Problem Relation Age of Onset    Hypothyroidism Mother     Heart attack Father     Hyperlipidemia Sister     Hypertension Sister     Diabetes Brother     COPD Brother     Heart attack Maternal Grandfather        Social: He reports that he has been smoking cigarettes. He has a 20.00 pack-year smoking history. He has never used smokeless tobacco. He reports current alcohol use of about 66.0 standard drinks of alcohol per week. He reports that he does not use drugs.        ATTESTATIONS     Roselyn Reef MD    Scribe Attestation:    I am the first provider for this patient and I personally performed the services documented. Milford Cage is scribing for me on this chart. This note and the patient instructions accurately reflect work and decisions made by me.      Documentation Notes:    Parts of this note were generated by the Epic EMR system/ Dragon speech recognition and may contain inherent errors or omissions not intended by the user. Grammatical errors, random word insertions, deletions, pronoun errors and incomplete sentences are occasional consequences of this technology due to software limitations. Not all errors are caught or corrected.    My documentation is often completed  after the patient is no longer under my clinical care. In some cases, the Epic EMR may pull updated results into the above documentation which may not reflect all results or information that was available to me at the time of my medical decision making.     If there are questions or concerns about the content of this note or information contained within the body of this dictation they should be addressed directly with the author for clarification.          Roselyn Reef, MD  01/07/22 2053

## 2022-01-07 NOTE — ED Notes (Signed)
I have briefly evaluated this patient as triage physician in order to facilitate and initiate the ordering of laboratory and imaging studies as needed.       Latanya Presser, MD  01/07/22 [phone]

## 2022-01-07 NOTE — ED Notes (Signed)
[facility] HOSPITAL EMERGENCY DEPT  ED NURSING NOTE FOR THE RECEIVING INPATIENT NURSE   [place]   [place] [address]   ED CHARGE RN [phone]   ADMISSION INFORMATION   Ilijah Doucet is a 46 y.o. male admitted with an ED diagnosis of:    1. Chest pain, unspecified type         Isolation: None   Allergies: Patient has no known allergies.   Holding Orders confirmed? N/A   Belongings Documented? N/A   Home medications sent to pharmacy confirmed? N/A   NURSING CARE   Patient Comes From:   Mental Status: Home Independent  alert and oriented   ADL: Independent with all ADLs   Ambulation: no difficulty   Pertinent Information  and Safety Concerns:     Broset Violence Risk Level: Low Chest pain, dizziness that began today. Reports hx blocked arteries. Planned heart cath 4/12. Pt rpts 100% blockage on "R side". Pt on heparin drip     CT / NIH   CT Head ordered on this patient?  No   NIH/Dysphagia assessment done prior to admission? N/A   VITAL SIGNS (at the time of this note)      Vitals:    01/07/22 2049   BP: (!) 139/91   Pulse: 68   Resp: 18   Temp:    SpO2: 97%

## 2022-01-07 NOTE — Addendum Note (Signed)
Addended by: Ellard Artis. on: 01/07/2022 04:03 PM     Modules accepted: Orders

## 2022-01-07 NOTE — Progress Notes (Signed)
01/07/22 2304   CM Review   Acknowledgment of Outpatient/Observation Observation letter given

## 2022-01-07 NOTE — ED Triage Notes (Signed)
Pt. C/o 9/10 LT sided CP that radiates to the pt.'s arm, dizziness, and SOB. Pt. Has 100% blockage he states in his RCA and is supposed to get stents placed on 4/13. Pt. NAD and A&Ox4. No trauma/injuries. Pt. Smokes 1/2 of cigs a day and drinks 6-12 beers daily.

## 2022-01-07 NOTE — H&P (Signed)
ADMISSION HISTORY AND PHYSICAL EXAM    Date Time: 01/07/22 8:39 PM  Patient Name: Erik [name]  Attending Physician: Morton Peters, DO  Primary Care Physician: Oneita Hurt, None, MD    CC: Chest pain      Assessment:   #Chest pain, recent diagnostic cardiac catheterization with two-vessel disease with plan for CTO PCI on January 24, 2022, admit for evaluation of possible expedited intervention  #Chronic hyponatremia, likely related to alcohol use, sodium level currently at baseline  #Nicotine dependence  #Alcohol dependence denies history of withdrawal symptoms    Plan:   Admit to telemetry  [facility] consulted by ED, will see the patient in the morning  We will hold patient n.p.o. in case cardiac catheterization can be pursued  Continue home medications  Patient's troponin negative x 2, no need to trend  Start heparin gtt  NPO for possible cath in AM   Patient declining nicotine patch at this time   Full code  DVT ppx: On heparin gtt    Time spent on direct patient care: 75 minutes; of which greater than 50% of this time was spent in counseling and coordination of care        .  Patient has BMI=Body mass index is 25.04 kg/m.  Diagnosis: Overweight based on BMI criteria       Recent Labs     01/07/22  1919   Sodium 127*     Diagnosis: Moderate Hyponatremia      Disposition: (Please see PAF column for Expected D/C Date)   Today's date: 01/07/2022  Admit Date: 01/07/2022  7:06 PM  Service status: Observation      History of Presenting Illness:   Erik [name] is a 46 y.o. male with past medical history of two-vessel coronary artery disease with coronary angiogram in March 2023 revealing two-vessel disease with 70% occlusion of the right OM and 100% occlusion on the right RCA with plans for cardiac catheterization for CTO PCI on April 14 who presents to the ER with worsening chest pain and tightness.  He was seen in cardiology clinic yesterday at which point he noted chest pain and shortness of breath.  He has  been medically optimized with Imdur, metoprolol, and Ranexa with persistent symptoms.  He is an active smoker with a 40-pack-year smoking history and uses 2 cans of dip tobacco every 3 days.  His father died of a fatal heart attack at the age of 17.  He has undergone echocardiogram on 12/he has undergone echocardiogram on 12/22 which showed an ejection fraction of 54% with normal diastolic function and no significant valvular disease.  Additionally, he was drinking 6 pack of beer daily with more on the weekends but has cut down now to having 2 cases of beer a week.  Of note, the patient had radial artery spasm during his diagnostic catheterization and is planned to have a bifemoral access for CTO PCI.    In the ED the patient was noted to be hemodynamically stable.  His lab work showed normal CBC, BMP with sodium 127, negative high-sensitivity troponin, chest x-ray with no acute disease.    Past Medical History:     Past Medical History:   Diagnosis Date    Chest pain 12/10/2021    COVID 09/2021    11/2021: asymptomatic    Hearing loss in Left ear     History of echocardiogram 10/05/2021    EF 54    History of nuclear stress test: exercise  SPECT abn 11/22/2021    Hyperlipidemia     on Rx    TBI (traumatic brain injury) 60       Available old records reviewed, including:  Epic    Past Surgical History:     Past Surgical History:   Procedure Laterality Date    DENTAL SURGERY      LHC W/ CORONARY ANGIOS AND LV Left 12/12/2021    Procedure: LHC w/ Coronary Angios and LV;  Surgeon: Cilia, Early Chars, MD;  Location: [facility];  Service: Cardiovascular;  Laterality: Left;       Family History:   Noncontributory    Social History:     Social History     Tobacco Use   Smoking Status Every Day    Packs/day: 0.50    Years: 40.00    Pack years: 20.00    Types: Cigarettes   Smokeless Tobacco Never   Vaping Use   Vaping Status Never Used     Social History     Substance and Sexual Activity   Alcohol Use Yes    Alcohol/week:  66.0 standard drinks of alcohol    Types: 66 Cans of beer per week    Comment: states he drinks 64-66 beers each week, regularly     Social History     Substance and Sexual Activity   Drug Use Never       Allergies:   No Known Allergies    Medications:     Home Medications       Med List Status: In Progress Set By: Lucienne Capers, RN at 01/07/2022  7:21 PM              aspirin 81 MG chewable tablet     Chew 1 tablet (81 mg) by mouth daily     Patient taking differently: Chew 81 mg by mouth every morning     atorvastatin (LIPITOR) 40 MG tablet     Take 1 tablet (40 mg) by mouth nightly     isosorbide mononitrate (IMDUR) 30 MG 24 hr tablet     Take 2 tablets (60 mg) by mouth daily     metoprolol succinate XL (TOPROL-XL) 25 MG 24 hr tablet     Take 1 tablet (25 mg) by mouth daily     Patient taking differently: Take 25 mg by mouth every morning     nitroglycerin (NITROSTAT) 0.4 MG SL tablet     Place 1 tablet (0.4 mg) under the tongue every 5 (five) minutes as needed for Chest pain May repeat up to 3x.  CALL 911 FOR UNRELIEVED CHEST PAIN     ranolazine (RANEXA) 500 MG 12 hr tablet     Take 2 tablets (1,000 mg) by mouth 2 (two) times daily              Method by which medications were confirmed on admission: Asked patient    Review of Systems:   All other systems were reviewed and are negative except: those mentioned in HPI    Physical Exam:   Patient Vitals for the past 24 hrs:   BP Temp Temp src Pulse Resp SpO2 Height Weight   01/07/22 1930 135/86 -- -- 68 16 98 % -- --   01/07/22 1841 (!) 146/92 98 F (36.7 C) Oral 76 17 98 % 1.88 m (6\' 2" ) 88.5 kg (195 lb)   01/07/22 1837 -- -- -- 79 -- 97 % -- --     Body  mass index is 25.04 kg/m.  No intake or output data in the 24 hours ending 01/07/22 2039    General: awake, alert, oriented x 3; no acute distress.  HEENT: perrla, eomi, sclera anicteric  oropharynx clear without lesions, mucous membranes moist  Neck: supple, no lymphadenopathy, no thyromegaly, no JVD, no carotid  bruits  Cardiovascular: regular rate and rhythm, no murmurs, rubs or gallops  Lungs: clear to auscultation bilaterally, without wheezing, rhonchi, or rales  Abdomen: soft, non-tender, non-distended; no palpable masses, no hepatosplenomegaly, normoactive bowel sounds, no rebound or guarding  Extremities: no clubbing, cyanosis, or edema  Neuro: cranial nerves grossly intact, strength 5/5 in upper and lower extremities, sensation intact,   Skin: no rashes or lesions noted        Labs:     Results       Procedure Component Value Units Date/Time    High Sensitivity Troponin-I at 0 hrs [[id number]] Collected: 01/07/22 1919    Specimen: Blood Updated: 01/07/22 1957     hs Troponin-I <2.7 ng/L     GFR [[id number]] Collected: 01/07/22 1919     Updated: 01/07/22 1949     EGFR >60.0       Basic Metabolic Panel [[id number]]  (Abnormal) Collected: 01/07/22 1919    Specimen: Blood Updated: 01/07/22 1949     Glucose 72 mg/dL      BUN 7.0 mg/dL      Creatinine 0.9 mg/dL      Calcium 9.2 mg/dL      Sodium 086 mEq/L      Potassium 3.9 mEq/L      Chloride 95 mEq/L      CO2 23 mEq/L      Anion Gap 9.0    CBC and differential [[id number]]  (Abnormal) Collected: 01/07/22 1919    Specimen: Blood Updated: 01/07/22 1941     WBC 8.46 x10 3/uL      Hgb 14.2 g/dL      Hematocrit 52.8 %      Platelets 207 x10 3/uL      RBC 4.42 x10 6/uL      MCV 87.3 fL      MCH 32.1 pg      MCHC 36.8 g/dL      RDW 12 %      MPV 8.6 fL      Instrument Absolute Neutrophil Count 4.27 x10 3/uL      Neutrophils 50.4 %      Lymphocytes Automated 30.4 %      Monocytes 6.3 %      Eosinophils Automated 11.1 %      Basophils Automated 1.3 %      Immature Granulocytes 0.5 %      Nucleated RBC 0.0 /100 WBC      Neutrophils Absolute 4.27 x10 3/uL      Lymphocytes Absolute Automated 2.57 x10 3/uL      Monocytes Absolute Automated 0.53 x10 3/uL      Eosinophils Absolute Automated 0.94 x10 3/uL      Basophils Absolute Automated 0.11 x10 3/uL      Immature Granulocytes Absolute  0.04 x10 3/uL      Absolute NRBC 0.00 x10 3/uL             Imaging personally reviewed, including: CXR     Safety Checklist  DVT prophylaxis:  CHEST guideline (See page [id number]) Chemical   Foley:  Alicia Rn Foley protocol Not present   IVs:  Peripheral IV   PT/OT: Not  needed   Daily CBC & or Chem ordered:  SHM/ABIM guidelines (see #5) Yes, due to clinical and lab instability       Signed by: Janine Ores, MD  cc:Pcp, None, MD

## 2022-01-07 NOTE — ED Notes (Signed)
Assisted pt with urinal use. Pt tolerated well

## 2022-01-08 ENCOUNTER — Encounter
Admission: EM | Disposition: A | Discharge status: Home / Self Care | Payer: Self-pay | Source: Home / Self Care | Attending: Emergency Medicine

## 2022-01-08 HISTORY — PX: LHC W/ CORONARY ANGIOS AND LV: CATH8

## 2022-01-08 LAB — HEPATIC FUNCTION PANEL
ALT: 19 U/L (ref 0–55)
AST (SGOT): 26 U/L (ref 5–41)
Albumin/Globulin Ratio: 1.3 (ref 0.9–2.2)
Albumin: 3.9 g/dL (ref 3.5–5.0)
Alkaline Phosphatase: 56 U/L (ref 37–117)
Bilirubin Direct: 0.2 mg/dL (ref 0.0–0.5)
Bilirubin Indirect: 0.2 mg/dL (ref 0.2–1.0)
Bilirubin, Total: 0.4 mg/dL (ref 0.2–1.2)
Globulin: 3.1 g/dL (ref 2.0–3.6)
Protein, Total: 7 g/dL (ref 6.0–8.3)

## 2022-01-08 LAB — BASIC METABOLIC PANEL
Anion Gap: 9 (ref 5.0–15.0)
BUN: 10 mg/dL (ref 9.0–28.0)
CO2: 25 mEq/L (ref 17–29)
Calcium: 9.1 mg/dL (ref 8.5–10.5)
Chloride: 100 mEq/L (ref 99–111)
Creatinine: 0.9 mg/dL (ref 0.5–1.5)
Glucose: 88 mg/dL (ref 70–100)
Potassium: 4.2 mEq/L (ref 3.5–5.3)
Sodium: 134 mEq/L — ABNORMAL LOW (ref 135–145)

## 2022-01-08 LAB — CBC AND DIFFERENTIAL
Absolute NRBC: 0 10*3/uL (ref 0.00–0.00)
Basophils Absolute Automated: 0.09 10*3/uL — ABNORMAL HIGH (ref 0.00–0.08)
Basophils Automated: 1.2 %
Eosinophils Absolute Automated: 1.06 10*3/uL — ABNORMAL HIGH (ref 0.00–0.44)
Eosinophils Automated: 14.3 %
Hematocrit: 40 % (ref 37.6–49.6)
Hgb: 14.5 g/dL (ref 12.5–17.1)
Immature Granulocytes Absolute: 0.02 10*3/uL (ref 0.00–0.07)
Immature Granulocytes: 0.3 %
Instrument Absolute Neutrophil Count: 3.39 10*3/uL (ref 1.10–6.33)
Lymphocytes Absolute Automated: 2.43 10*3/uL (ref 0.42–3.22)
Lymphocytes Automated: 32.8 %
MCH: 31.9 pg (ref 25.1–33.5)
MCHC: 36.3 g/dL — ABNORMAL HIGH (ref 31.5–35.8)
MCV: 88.1 fL (ref 78.0–96.0)
MPV: 8.9 fL (ref 8.9–12.5)
Monocytes Absolute Automated: 0.41 10*3/uL (ref 0.21–0.85)
Monocytes: 5.5 %
Neutrophils Absolute: 3.39 10*3/uL (ref 1.10–6.33)
Neutrophils: 45.9 %
Nucleated RBC: 0 /100 WBC (ref 0.0–0.0)
Platelets: 197 10*3/uL (ref 142–346)
RBC: 4.54 10*6/uL (ref 4.20–5.90)
RDW: 13 % (ref 11–15)
WBC: 7.4 10*3/uL (ref 3.10–9.50)

## 2022-01-08 LAB — ECG 12-LEAD
Atrial Rate: 74 {beats}/min
IHS MUSE NARRATIVE AND IMPRESSION: NORMAL
P Axis: 75 degrees
P-R Interval: 194 ms
Q-T Interval: 402 ms
QRS Duration: 110 ms
QTC Calculation (Bezet): 446 ms
R Axis: 79 degrees
T Axis: 15 degrees
Ventricular Rate: 74 {beats}/min

## 2022-01-08 LAB — MAGNESIUM: Magnesium: 2.1 mg/dL (ref 1.6–2.6)

## 2022-01-08 LAB — ANTI-XA,UFH
Anti-Xa, UFH: 0.06 IU/mL
Anti-Xa, UFH: 0.13 IU/mL

## 2022-01-08 LAB — I-STAT ACT KAOLIN: i-STAT ACT Kaolin: 510 s — ABNORMAL HIGH (ref 74–147)

## 2022-01-08 LAB — PROBNP: NT-proBNP: 119 pg/mL (ref 0–125)

## 2022-01-08 LAB — GFR: EGFR: >60

## 2022-01-08 SURGERY — LHC W/ CORONARY ANGIOS AND LV
Anesthesia: Conscious Sedation | Site: Arm Lower | Laterality: Left

## 2022-01-08 MED ORDER — SODIUM CHLORIDE 0.9 % IV SOLN
INTRAVENOUS | Status: AC | PRN
Start: 2022-01-08 — End: 2022-01-08
  Administered 2022-01-08: 14:00:00 140 ug/kg/min via INTRAVENOUS

## 2022-01-08 MED ORDER — SODIUM CHLORIDE 0.9 % IV BOLUS
INTRAVENOUS | Status: AC | PRN
Start: 2022-01-08 — End: 2022-01-08
  Administered 2022-01-08: 300 mL via INTRAVENOUS

## 2022-01-08 MED ORDER — HEPARIN (PORCINE) IN NACL 1000-0.9 UT/500ML-% IV SOLN - TABLE FLUSH (SEDATION NARRATOR)
INTRAVENOUS | Status: AC | PRN
Start: 2022-01-08 — End: 2022-01-08
  Administered 2022-01-08: 3000 [IU]

## 2022-01-08 MED ORDER — HEPARIN SODIUM (PORCINE) 1000 UNIT/ML IJ SOLN
INTRAMUSCULAR | Status: AC | PRN
Start: 2022-01-08 — End: 2022-01-08
  Administered 2022-01-08: 7000 [IU] via INTRAVENOUS

## 2022-01-08 MED ORDER — MIDAZOLAM HCL 1 MG/ML IJ SOLN (WRAP)
INTRAMUSCULAR | Status: AC | PRN
Start: 2022-01-08 — End: 2022-01-08
  Administered 2022-01-08: 1 mg via INTRAVENOUS

## 2022-01-08 MED ORDER — NICOTINE 14 MG/24HR TD PT24
1.0000 | MEDICATED_PATCH | Freq: Every day | TRANSDERMAL | Status: DC
Start: 2022-01-08 — End: 2022-01-09
  Administered 2022-01-08 – 2022-01-09 (×2): 1 via TRANSDERMAL
  Filled 2022-01-08 (×2): qty 1

## 2022-01-08 MED ORDER — FENTANYL CITRATE (PF) 50 MCG/ML IJ SOLN (WRAP)
INTRAMUSCULAR | Status: AC | PRN
Start: 2022-01-08 — End: 2022-01-08
  Administered 2022-01-08: 25 ug via INTRAVENOUS

## 2022-01-08 MED ORDER — VH VERAPAMIL HCL 2.5 MG/ML IV SOLN (IR NARRATOR)
INTRAVENOUS | Status: AC | PRN
Start: 2022-01-08 — End: 2022-01-08
  Administered 2022-01-08: 5 mg via INTRA_ARTERIAL

## 2022-01-08 MED ORDER — HEPARIN SODIUM (PORCINE) 1000 UNIT/ML IJ SOLN
INTRAMUSCULAR | Status: AC
Start: 2022-01-08 — End: ?
  Filled 2022-01-08: qty 10

## 2022-01-08 MED ORDER — ISOSORBIDE MONONITRATE ER 60 MG PO TB24
90.0000 mg | ORAL_TABLET | Freq: Every day | ORAL | Status: DC
Start: 2022-01-09 — End: 2022-01-09
  Administered 2022-01-09: 90 mg via ORAL
  Filled 2022-01-08: qty 1

## 2022-01-08 MED ORDER — LIDOCAINE HCL (PF) 1 % IJ SOLN
INTRAMUSCULAR | Status: AC
Start: 2022-01-08 — End: ?
  Filled 2022-01-08: qty 5

## 2022-01-08 MED ORDER — IOHEXOL 300 MG/ML IJ SOLN
35.0000 mL | Freq: Once | INTRAMUSCULAR | Status: AC | PRN
Start: 2022-01-08 — End: 2022-01-08
  Administered 2022-01-08: 14:00:00 35 mL via INTRA_ARTERIAL

## 2022-01-08 MED ORDER — LIDOCAINE HCL 1 % IJ SOLN
INTRAMUSCULAR | Status: AC | PRN
Start: 2022-01-08 — End: 2022-01-08
  Administered 2022-01-08: 3 mL via SUBCUTANEOUS

## 2022-01-08 MED ORDER — FENTANYL CITRATE (PF) 50 MCG/ML IJ SOLN (WRAP)
INTRAMUSCULAR | Status: AC
Start: 2022-01-08 — End: ?
  Filled 2022-01-08: qty 2

## 2022-01-08 MED ORDER — ADENOSINE 6 MG/2ML IV SOLN
INTRAVENOUS | Status: AC
Start: 2022-01-08 — End: ?
  Filled 2022-01-08: qty 2

## 2022-01-08 MED ORDER — HEPARIN SODIUM (PORCINE) 1000 UNIT/ML IJ SOLN
INTRAMUSCULAR | Status: AC | PRN
Start: 2022-01-08 — End: 2022-01-08
  Administered 2022-01-08: 5000 [IU] via INTRA_ARTERIAL

## 2022-01-08 MED ORDER — ADENOSINE (DIAGNOSTIC) 3 MG/ML IV SOLN
INTRAVENOUS | Status: AC
Start: 2022-01-08 — End: ?
  Filled 2022-01-08: qty 30

## 2022-01-08 MED ORDER — VERAPAMIL HCL 2.5 MG/ML IV SOLN
INTRAVENOUS | Status: AC
Start: 2022-01-08 — End: ?
  Filled 2022-01-08: qty 4

## 2022-01-08 MED ORDER — HEPARIN (PORCINE) IN NACL 2-0.9 UNIT/ML-% IJ SOLN (WRAP)
INTRAVENOUS | Status: AC
Start: 2022-01-08 — End: ?
  Filled 2022-01-08: qty 1500

## 2022-01-08 MED ORDER — ISOSORBIDE MONONITRATE ER 30 MG PO TB24
90.0000 mg | ORAL_TABLET | Freq: Every day | ORAL | 0 refills | Status: DC
Start: 2022-01-08 — End: 2022-01-09

## 2022-01-08 MED ORDER — MIDAZOLAM HCL 1 MG/ML IJ SOLN (WRAP)
INTRAMUSCULAR | Status: AC
Start: 2022-01-08 — End: ?
  Filled 2022-01-08: qty 2

## 2022-01-08 SURGICAL SUPPLY — 21 items
CATHETER ANGIO TRILON FR4 CRV EXPO 5FR (Catheter Micellaneous) ×1
CATHETER ANGIO VESTAN SS JR4 CRV INFNT 4 (Catheter Micellaneous) ×1
CATHETER EBU3.5 CURVE OD6 FR LARGE LUMEN (Catheter) ×1
CATHETER GD NYL PLMR EBU3.5 CRV LNCHR 6 (Catheter) ×1
CATHETER OD4 FR L100 CM LARGE INNER (Catheter Micellaneous) ×1
CATHETER OD4 FR L100 CM LARGE INNER LUMEN RADIOPAQUE JR4 CURVE (Catheter Miscellaneous) ×1 IMPLANT
CATHETER OD5 FR L100 CM FULL LENGTH WIRE (Catheter Micellaneous) ×1
CATHETER OD5 FR L100 CM FULL LENGTH WIRE BRAID ROBUST SHAFT FR4 CURVE (Catheter Miscellaneous) ×1 IMPLANT
CATHETER OD6 FR L100 CM EBU3.5 CURVE LAUNCHER GUIDING LARGE LUMEN (Catheter) ×1 IMPLANT
DEVICE CMPR REG TR BAND 24CM 2 BLN TRNS (Lab Supplies) ×1
DEVICE COMPRESSION L24 CM REGULAR 2 (Lab Supplies) ×1
DEVICE COMPRESSION L24 CM REGULAR 2 BALLOON TRANSPARENT ADJUSTABLE (Lab Supplies) ×1 IMPLANT
GUIDEWIRE VASCULAR OD.014 IN L185 CM L3 (Guidwire) ×1
GUIDEWIRE VASCULAR OD.014 IN L185 CM L3 CM COMETâ„¢ 33 CM OTW PRESSURE (Guidwire) ×1 IMPLANT
GW COMET II PRESSURE (Guidwire) ×1
KIT INTRO PLS HDRPH .025IN 32MM SHRT ANG (Introducer) ×1
KIT INTRODUCER L10 CM .025 IN L32 MM (Introducer) ×1
KIT INTRODUCER L10 CM .025 IN L32 MM SHORT ANGLE SHEATH DILATOR 2 WALL (Introducer) ×1 IMPLANT
VALVE HEMOSTASIS COPILOT ID.096 IN 1 (Procedure Accessories) ×1
VALVE HEMOSTASIS COPILOT ID.096 IN 1 UNIT BLEEDBACK CONTROL (Procedure Accessories) ×1 IMPLANT
VALVE HMSTS COPLT .096IN STRL 1 UN (Procedure Accessories) ×1

## 2022-01-08 NOTE — H&P (Signed)
H&P UPDATE WITH ASA/MALLAMPATTI    Date Time: 01/08/22 1:09 PM    PROCEDURE:    Left heart cath, possible percutaneous coronary intervention  INDICATIONS:    coronary artery disease  H&P:    The history and physical including past medical, family, and social history were reviewed \\and  there are no significant interval changes from what is currently available in the chart from prior evaluation. He has no complaints.  He was seen and examined by me prior to the procedure.   ALLERGIES:    Patient has no known allergies.   LABS:      Lab Results   Component Value Date    WBC 7.40 01/08/2022    HGB 14.5 01/08/2022    HCT 40.0 01/08/2022    PLT 197 01/08/2022    NA 134 (L) 01/08/2022    K 4.2 01/08/2022    CL 100 01/08/2022    CO2 25 01/08/2022    MG 2.1 01/08/2022    BUN 10.0 01/08/2022    CREAT 0.9 01/08/2022    EGFR >60.0 01/08/2022    GLU 88 01/08/2022     ASA PHYSICAL STATUS    Class 2 - Mild systemic disease, no functional limitations  MALLAMPATTI AIRWAY CLASSIFICATION    Class II: Visibility of hard and soft palate, upper portion of tonsils and uvula  ACC BLEEDING RISK SCORE      PLANNED SEDATION:    ( ) NO SEDATION   (x) MODERATE SEDATION   ( ) DEEP SEDATION WITH ANESTHESIA   CONCLUSION:    The risks, benefits and alternatives of the procedure have been discussed in detail and he has indicated that he understands the procedure, indications, and risks inherent to the procedure and is amenable to proceeding.  All questions were answered. Informed consent was signed and verified.      Signed by: Tawanna Sat, MD

## 2022-01-08 NOTE — Progress Notes (Signed)
MEDICINE PROGRESS NOTE    Date Time: 01/08/22 10:21 AM  Patient Name: Erik [name]  Attending Physician: Kateri Mc, DO    Assessment:   From HPI:    46 y.o. male with past medical history of two-vessel coronary artery disease with coronary angiogram in March 2023 revealing two-vessel disease with 70% occlusion of the right OM and 100% occlusion on the right RCA with plans for cardiac catheterization for CTO PCI on April 14 who presents to the ER with worsening chest pain and tightness.  He was seen in cardiology clinic yesterday at which point he noted chest pain and shortness of breath.  He has been medically optimized with Imdur, metoprolol, and Ranexa with persistent symptoms.  He is an active smoker with a 40-pack-year smoking history and uses 2 cans of dip tobacco every 3 days.  His father died of a fatal heart attack at the age of 19.  He has undergone echocardiogram on 12/he has undergone echocardiogram on 12/22 which showed an ejection fraction of 54% with normal diastolic function and no significant valvular disease.  Additionally, he was drinking 6 pack of beer daily with more on the weekends but has cut down now to having 2 cases of beer a week.  Of note, the patient had radial artery spasm during his diagnostic catheterization and is planned to have a bifemoral access for CTO PCI.     In the ED the patient was noted to be hemodynamically stable.  His lab work showed normal CBC, BMP with sodium 127, negative high-sensitivity troponin, chest x-ray with no acute disease.    #Chest pain, recent diagnostic cardiac catheterization with two-vessel disease with plan for CTO PCI on January 24, 2022, admit for evaluation of possible expedited intervention  #Chronic hyponatremia, likely related to alcohol use, sodium level currently at baseline  #Nicotine dependence  #Alcohol dependence denies history of withdrawal symptoms    Subjective     CC: Chest pain, unspecified type    Interval History/24 hour  events:     HPI/Subjective: patient states that he is feeling fine and chest pain free  Patient had CP that was not resolving with SL nitro yest at work  NPO for procedures    Plan:   Appreciate [facility] input  Patient on schedule today for diagnostic coronary angiogram today + dFR LAD with Dr. Shon Baton at ~3;00pm  Per Cardio: If dFR LAD positive will be referred for CABG evaluation and recs below:    Patient was scheduled for RCA CTO (chronic total occlusion) PCI +/- left circumflex PCI in same setting vs. Staged on 01/23/2022. LAD with moderate disease.     Given recurrent chest pain, worse then baseline on medical therapy recommend re-look angiogram + dFR LAD. If dFR LAD positive, surgeons consult for CABG eval (RCA, circumflex-OM and LAD). If disease is stable and dFR negative, please optimize medical therapy and will plan for RCA CTO PCI and potential circumflex PCI on 01/23/2022.       Cont ASA,statin, imdur, toprol, ranexa  Patient's troponin negative x 2, no need to trend  On heparin gtt  Nicotine patch added  Keep NPO for procedures    Full code  DVT ppx: On heparin gtt    Case discussed with: RN, Cardio    Safety Checklist:     DVT prophylaxis:  CHEST guideline (See page [phone]) Chemical and Mechanical   Foley:  [facility] Rn Foley protocol Not present   IVs:  Peripheral IV  PT/OT: Not needed   Daily CBC & or Chem ordered:  SHM/ABIM guidelines (see #5) Yes, due to clinical and lab instability   Reference for approximate charges of common labs: CBC auto diff - $76  BMP - $99  Mg - $79    Lines:     Patient Lines/Drains/Airways Status       Active PICC Line / CVC Line / PIV Line / Drain / Airway / Intraosseous Line / Epidural Line / ART Line / Line / Wound / Pressure Ulcer / NG/OG Tube       Name Placement date Placement time Site Days    Peripheral IV 01/07/22 18 G Standard Right Antecubital 01/07/22  1920  Antecubital  less than 1    Peripheral IV 01/07/22 20 G Anterior;Left Forearm 01/07/22  --   Forearm  1                     Disposition: (Please see PAF column for Expected D/C Date)   Today's date: 01/08/2022  Admit Date: 01/07/2022  7:06 PM  LOS: 0  Clinical Milestones:   Anticipated discharge needs:       Review of Systems:     As per HPI    Physical Exam:     VITAL SIGNS PHYSICAL EXAM   Temp:  [97.5 F (36.4 C)-98.1 F (36.7 C)] 97.9 F (36.6 C)  Heart Rate:  [64-79] 73  Resp Rate:  [15-18] 15  BP: (109-146)/(71-92) 120/76  Blood Glucose:    Telemetry:     No intake or output data in the 24 hours ending 01/08/22 1021 Physical Exam  General: awake, alert X 3  Cardiovascular: regular rate and rhythm, no murmurs, rubs or gallops  Lungs: clear to auscultation bilaterally, without wheezing, rhonchi, or rales  Abdomen: soft, non-tender, non-distended; no palpable masses,  normoactive bowel sounds  Extremities: no edema  Other:        Meds:     Medications were reviewed:  Current Facility-Administered Medications   Medication Dose Route Frequency    aspirin  81 mg Oral Daily    atorvastatin  40 mg Oral QHS    isosorbide mononitrate  60 mg Oral Daily    metoprolol succinate XL  25 mg Oral Daily    nicotine  1 patch Transdermal Daily    ranolazine  1,000 mg Oral BID     Current Facility-Administered Medications   Medication Dose Route Frequency Last Rate    heparin infusion 25,000 units/500 mL (Cardiac/Low Intensity)  11.3 Units/kg/hr Intravenous Continuous 14.3 Units/kg/hr (01/08/22 0740)     Current Facility-Administered Medications   Medication Dose Route    acetaminophen  650 mg Oral    Or    acetaminophen  650 mg Rectal    calcium carbonate  1,000 mg Oral    dextrose  15 g of glucose Oral    Or    dextrose  12.5 g Intravenous    Or    dextrose  12.5 g Intravenous    Or    glucagon (rDNA)  1 mg Intramuscular    magnesium oxide  0-800 mg Oral    And    magnesium sulfate  1 g Intravenous    melatonin  3 mg Oral    naloxone  0.2 mg Intravenous    nitroglycerin  0.4 mg Sublingual    ondansetron  4 mg Oral     Or    ondansetron  4 mg Intravenous  polyethylene glycol  17 g Oral    potassium & sodium phosphates  2 packet Oral    potassium chloride  0-40 mEq Oral    And    potassium chloride  10 mEq Intravenous    senna-docusate  2 tablet Oral         Labs:     Labs (last 72 hours):    Recent Labs   Lab 01/08/22  0418 01/07/22  2051   WBC 7.40 8.29   Hgb 14.5 13.8   Hematocrit 40.0 38.0   Platelets 197 204       Recent Labs   Lab 01/07/22  2051   PTT 30    Recent Labs   Lab 01/08/22  0418 01/07/22  2129 01/07/22  1919   Sodium 134*  --  127*   Potassium 4.2  --  3.9   Chloride 100  --  95*   CO2 25  --  23   BUN 10.0  --  7.0*   Creatinine 0.9  --  0.9   Calcium 9.1  --  9.2   Albumin  --  3.9  --    Protein, Total  --  7.0  --    Bilirubin, Total  --  0.4  --    Alkaline Phosphatase  --  56  --    ALT  --  19  --    AST (SGOT)  --  26  --    Glucose 88  --  72                   Microbiology, reviewed and are significant for:  Microbiology Results (last 15 days)       ** No results found for the last 360 hours. **            Imaging, reviewed and are significant for:  XR Chest  AP Portable    Result Date: 01/07/2022  1. No acute disease. Kennyth Lose, MD 01/07/2022 7:52 PM         Signed by: Kateri Mc, DO

## 2022-01-08 NOTE — Progress Notes (Signed)
Initial Case Management Assessment and Discharge Planning  [facility]   Patient Name: Erik [name], Erik [name]   Date of Birth [birth year]   Attending Physician: Kateri Mc, DO   Primary Care Physician: Pcp, None, MD   Length of Stay 0   Reason for Consult / Chief Complaint Cm Initial Assessment        Situation   Admission DX:   1. Chest pain, unspecified type    2. Coronary artery disease, unspecified vessel or lesion type, unspecified whether angina present, unspecified whether native or transplanted heart    3. Coronary artery disease, unspecified vessel or lesion type, unspecified whether angina present, unspecified whether native or transplanted heart        A/O Status: X 3    LACE Score: 6    Patient admitted from: ER  Admission Status: observation    Health Care Agent: Self  Name: Ko Bardon  Phone number: [phone]       Background     Advanced directive:   <no information>    Code Status:   Full Code     Residence: Apartment    PCP: PCP None, MD  Patient Contact:   [phone] (home)     [phone] (mobile)     Emergency contact:   Extended Emergency Contact Information  Primary Emergency Contact: Davis-Smith,Andrea  Mobile Phone: [phone]  Relation: Significant Other  Preferred language: English  Interpreter needed? No  Secondary Emergency Contact: Snow,Margaret  Mobile Phone: [phone]  Relation: Mother  Preferred language: English  Interpreter needed? No      ADL/IADL's: Independent  Previous Level of function: 7 Independent     DME: None    Pharmacy:     [id number] - [place], [place] - [address]  [address] [place] [address]  Phone: [phone] Fax: [phone]      Prescription Coverage: Yes    Home Health: The patient is not currently receiving home health services.    Previous SNF/AR: N/A    COVID Vaccine Status: Unknown    Date First IMM given: Unknown  UAI on file?: No  Transport for discharge? Mode of transportation: Private  Vehicle - Patient to drive self  Agreeable to Home with family post-discharge:  Yes     Assessment     BARRIERS TO DISCHARGE: Cardiac Cath      Recommendation   D/C Plan A: Home with family    D/C Plan B: Home with home health    D/C Plan C: Acute Rehab     Lorren Rossetti,BSN,RN,Case Manager  [facility]  [place], [place]  [phone]

## 2022-01-08 NOTE — Procedures (Signed)
CARDIAC CATH LAB REPORT    PROCEDURES PERFORMED  Left heart cath  Coronary angiogram  FFR of LAD    LOCATION  [facility]    INDICATION  Coronary artery disease  Chest pain    POST-PROCEDURE DIAGNOSIS  Ostial 40% stenosis in LAD.  40-50% mid LAD stenosis, positive by DFR (0.89) and FFR (0.79)  70% OM1 stenosis  Normal LV filling pressure without aortic stenosis  Known CTO of RCA (not injected)       DESCRIPTION OF PROCEDURE    The patient was consented and brought to the cardiac Cath Lab.  The patient was prepped and draped in usual sterile fashion.  Timeout was performed.  Subcutaneous lidocaine was administered over the access site.  Ultrasound guidance was utilized to obtain right radial access using a modified Seldinger technique and a 6 French slender sheath was placed.  Coronary angiography was performed using a 6Fr EBU 3.5 guide catheter.  DFR/FFR performed per standard protocol with heparin anticoagulation with IV adenosine.  This demonstrated the following:     FINDINGS    Left Heart Catheterization  LVEDP: 7 mmHg  No significant LV/AO gradient    Coronary Angiography  Right dominant system  Left Main: Large vessel that bifurcates into the LAD and left circumflex. Luminal irregularities <10%  Left anterior descending: Large vessel which courses along the anterior interventricular septum and wraps around the cardiac apex. Ostial 40% stenosis.  Proximal to mid LAD 40-50% stenosis. DFR .89.  FFR 0.79  Left Circumflex: Large vessel which courses along the left AV groove. Superior branch of OM1 with 70% proximal disease.  OM2 parent.  Right coronary artery: Not injected.  Known proximal RCA occlusion and the distal vessel fills via robust L to R collaterals.         SUMMARY    Ostial 40% stenosis in LAD.  40-50% mid LAD stenosis, positive by DFR (0.89) and FFR (0.79)  70% OM1 stenosis  Normal LV filling pressure without aortic stenosis  Known CTO of RCA (not injected  Complications:  None    RECOMMENDATIONS    CT surgery evaluation for multivessel CABG  Disposition: Back to floor.  Can be discharged later today if chest pain free.  Hemostasis: A TR band was placed for hemostasis.  IV Fluid: Normal Saline 100 ml/hr x 3 hours    The findings were discussed with the patient. All questions were answered    Erik Borel El-Haddad, MD, [facility], FSCAI  [facility], Interventional Cardiology

## 2022-01-08 NOTE — Consults (Signed)
HEART INTERVENTIONAL CARDIOLOGY CONSULTATION REPORT  [facility]    Date Time: 01/08/22 8:47 AM  Patient Name: Erik [name]  Requesting Physician: Kateri Mc, DO     Primary Cardiologist: Trellis Moment MD, [facility], [facility]     Reason for Consultation:   Chest pain    History:   Erik [name] is a 46 y.o. male admitted on 01/07/2022.  We have been asked by Kateri Mc, DO,  to provide cardiac consultation, regarding his chest pain.    He is a very pleasant 47 y.o. male with a history of newly diagnosed 2V CAD (OM1 70% and RCA CTO, moderate disease appreciated in LAD) diagnosed in 12/2021. Patient presented to the ED in 09/2021 with chest pain and workup with exercise SPECT showing inferior and inferolateral wall ischemia which led to a coronary angiogram in early March 2023 revealing 2VD. He was seen in the office by myself on 01/06/2022 and he remained stable with CCS 3 angina and dyspnea on max antianginal therapy (imdur 60 mg daily, metoprolol succinate 25 mg daily and ranexa 1000 mg BID) with plans for RCA CTO PCI +/- Circumflex intervention in same setting vs. Staged procedure on 01/23/2022.     Today he reports that yesterday while at work he was very active and stressed and he developed chest tightness/pain that radiated down his left arm. Pain was intermittent throughout the day and he got back home and called [facility] on call. He was prescribed SL nitro, and he took two with minimal resolution of the pain so he presented to the ED. He admits that the pain he had yesterday was worse than the stable pain he has been experiencing.     Since arrival to the ED patient is chest pain free and stable.     No other acute complaints.     Past Medical History:     Past Medical History:   Diagnosis Date    Chest pain 12/10/2021    COVID 09/2021    11/2021: asymptomatic    Hearing loss in Left ear     History of echocardiogram 10/05/2021    EF 54    History of nuclear stress test:  exercise SPECT abn 11/22/2021    Hyperlipidemia     on Rx    TBI (traumatic brain injury) 1999       Past Surgical History:     Past Surgical History:   Procedure Laterality Date    DENTAL SURGERY      LHC W/ CORONARY ANGIOS AND LV Left 12/12/2021    Procedure: LHC w/ Coronary Angios and LV;  Surgeon: Sweetie Giebler, Early Chars, MD;  Location: [facility] C[facility];  Service: Cardiovascular;  Laterality: Left;       Family History:     Family History   Problem Relation Age of Onset    Hypothyroidism Mother     Heart attack Father     Hyperlipidemia Sister     Hypertension Sister     Diabetes Brother     COPD Brother     Heart attack Maternal Grandfather        Social History:     Social History     Socioeconomic History    Marital status: Widowed     Spouse name: Not on file    Number of children: Not on file    Years of education: Not on file    Highest education level: Not on file   Occupational History  Not on file   Tobacco Use    Smoking status: Every Day     Packs/day: 0.50     Years: 40.00     Pack years: 20.00     Types: Cigarettes    Smokeless tobacco: Never   Vaping Use    Vaping status: Never Used   Substance and Sexual Activity    Alcohol use: Yes     Alcohol/week: 66.0 standard drinks of alcohol     Types: 66 Cans of beer per week     Comment: states he drinks 64-66 beers each week, regularly    Drug use: Never    Sexual activity: Not on file   Other Topics Concern    Not on file   Social History Narrative    Not on file     Social Determinants of Health     Financial Resource Strain: Not on file   Food Insecurity: Not on file   Transportation Needs: Not on file   Physical Activity: Not on file   Stress: Not on file   Social Connections: Not on file   Intimate Partner Violence: Not on file   Housing Stability: Not on file       Allergies:   No Known Allergies    Medications:     Medications Prior to Admission   Medication Sig    aspirin 81 MG chewable tablet Chew 1 tablet (81 mg) by mouth daily (Patient taking  differently: Chew 81 mg by mouth every morning)    atorvastatin (LIPITOR) 40 MG tablet Take 1 tablet (40 mg) by mouth nightly    isosorbide mononitrate (IMDUR) 30 MG 24 hr tablet Take 2 tablets (60 mg) by mouth daily    metoprolol succinate XL (TOPROL-XL) 25 MG 24 hr tablet Take 1 tablet (25 mg) by mouth daily (Patient taking differently: Take 25 mg by mouth every morning)    nitroglycerin (NITROSTAT) 0.4 MG SL tablet Place 1 tablet (0.4 mg) under the tongue every 5 (five) minutes as needed for Chest pain May repeat up to 3x.  CALL 911 FOR UNRELIEVED CHEST PAIN    ranolazine (RANEXA) 500 MG 12 hr tablet Take 2 tablets (1,000 mg) by mouth 2 (two) times daily        Current Facility-Administered Medications   Medication Dose Route Frequency    aspirin  81 mg Oral Daily    atorvastatin  40 mg Oral QHS    isosorbide mononitrate  60 mg Oral Daily    metoprolol succinate XL  25 mg Oral Daily    ranolazine  1,000 mg Oral BID      heparin infusion 25,000 units/500 mL (Cardiac/Low Intensity) 14.3 Units/kg/hr (01/08/22 0740)       Review of Systems:    Comprehensive review of systems including constitutional, eyes, ears, nose, mouth, throat, cardiovascular, GI, GU, musculoskeletal, integumentary, respiratory, neurologic, psychiatric, and endocrine is negative other than what is mentioned already in the history of present illness    Physical Exam:     VITAL SIGNS PHYSICAL EXAM   Vitals:    01/08/22 0801   BP: 116/77   Pulse: 64   Resp: 15   Temp: 97.9 F (36.6 C)   SpO2: 94%     Temp (24hrs), Avg:97.8 F (36.6 C), Min:97.5 F (36.4 C), Max:98.1 F (36.7 C)      Intake and Output Summary (Last 24 hours) at Date Time  No intake or output data in the 24 hours ending 01/08/22  1610    Telemetry: no changes Physical Exam  General: awake, alert, breathing comfortably, no acute distress  Head: normocephalic  Eyes: EOM's intact  Cardiovascular: regular rate and rhythm, normal S1, S2, no S3, no S4, no murmurs, rubs or gallops  Neck:  no carotid bruits or JVD  Lungs: clear to auscultation bilateraly, without wheezing, rhonchi, or rales  Abdomen: soft, non-tender, non-distended; no palpable masses,  normoactive bowel sounds  Extremities: no edema  Pulse: equal pulses, 4/4 symmetric  Neurological: Alert and oriented X3, mood and affect normal  Musculoskeletal: normal strength and tone     Labs Reviewed:     Recent Labs   Lab 01/07/22  2129 01/07/22  1919   hs Troponin-I <2.7 <2.7   hs Troponin-I Delta Unable toCalc.  --              Recent Labs   Lab 01/07/22  2129   Bilirubin, Total 0.4   Bilirubin Direct 0.2   Protein, Total 7.0   Albumin 3.9   ALT 19   AST (SGOT) 26     Recent Labs   Lab 01/08/22  0418   Magnesium 2.1     Recent Labs   Lab 01/07/22  2051   PTT 30     Recent Labs   Lab 01/08/22  0418 01/07/22  2051 01/07/22  1919   WBC 7.40 8.29 8.46   Hgb 14.5 13.8 14.2   Hematocrit 40.0 38.0 38.6   Platelets 197 204 207     Recent Labs   Lab 01/08/22  0418 01/07/22  1919   Sodium 134* 127*   Potassium 4.2 3.9   Chloride 100 95*   CO2 25 23   BUN 10.0 7.0*   Creatinine 0.9 0.9   EGFR >60.0 >60.0   Glucose 88 72   Calcium 9.1 9.2       DIAGNOSTIC    EKG: NSR. No acute ischemic changes.     chest X-ray: No acute disease.     Echo Results       None            Assessment:   Chest pain with hospital admission 12/2021  Chest pain 09/2021 with both typical (elephant on chest) and atypical (reproducible on exam with palpation of chest) features, with serial hs-troponins negative despite many hours of pain (8 hs continuous prior to ED presentation)  CAD on the basis of coronary calcifications seen incidentally on CT chest 12/23 that ruled out dissection, central PE   Family history of CAD father's fatal MI at age 54  Patient remains hemodynamically and electrically stable  EKG does not meet STEMI criteria   HS trop negative  D-Dimer slightly elevated  Discharged home on aspirin, statin, Imdur and BB   Exercise SPECT 11/2021 showing inferior and  inferolateral wall ischemia.  Coronary angiogram with 2VD (OM1 70%, RCA CTO)  TTE 10/05/21 EF 54%, normal diastolic fxn, no sig valve dz    COVID detected on nasal swab, largely asymptomatic, no oxygen requirements  Tobacco use 1 ppd smoked (prior 3-4 ppd for 20+ years), and 2 cans of dip tobacco every three days  Daily EtOH 6-pack beer and more on weekends. He now is having 2 cases of beer a week.   Limited medical follow up- has not seen an outpatient doctor for 10 years     Recommendations:   Chest pain c/f Unstable angina  Chest pain yesterday with exertion at work with no  resolution with SL nitro  Patient was scheduled for RCA CTO PCI +/- left circumflex PCI in same setting vs. Staged on 01/23/2022. LAD with moderate disease.   Given recurrent chest pain, worse then baseline on medical therapy recommend re-look angiogram + dFR LAD. If dFR LAD positive, please have surgeons consult for CABG eval (RCA, circumflex-OM and LAD). If disease is stable and dFR negative, please optimize medical therapy and will plan for RCA CTO PCI and potential circumflex PCI on 01/23/2022.   HTN  Continue home regimen  HLD  Continue home regimen    Thank you for this consult and for allowing me to participate in [facility] care. We will continue to follow.     Signed by: Art Buff, MD    [facility]  NP Spectralink [phone] (8am-4:30pm)  MD Spectralink [phone] or [phone] (8am-5pm)  Arrhythmia Spectralink [phone] (8am-4:30pm)  After hours, non urgent consult line [phone]  After Hours, urgent consults [phone](Answering Service, will page on call MD)

## 2022-01-08 NOTE — Progress Notes (Signed)
CATH LAB PROCEDURE HANDOFF REPORT    Date Time: 01/08/22 1:54 PM    INDICATIONS:    chest pain  POST PROCEDURE DEBRIEF:    Left heart cath  Key concerns for recovery/management: bleeding/hematoma  ALLERGIES:    Patient has no known allergies.   ACC BLEEDING RISK SCORE       MEDICAL HISTORY:      Past Medical History:   Diagnosis Date    Chest pain 12/10/2021    COVID 09/2021    11/2021: asymptomatic    Hearing loss in Left ear     History of echocardiogram 10/05/2021    EF 54    History of nuclear stress test: exercise SPECT abn 11/22/2021    Hyperlipidemia     on Rx    TBI (traumatic brain injury) 1999      ACCESS:    24F sheath in right radial artery  Hemostasis: TR band, 12 mL's of air at 1430  Post procedure pulses: palpable in right arm/hand  Visual appearance: clean/dry/intact with good distal pulses   MEDICATIONS:    Lidocaine subQ  Versed: 1 mg IV  Fentanyl: 25 mcg IV  Heparin:  12,000 units IV  Verapamil: 5 mg IA  Adenosine: 140 mg/min/kg for 2 minutes (FFR flow wire study performed)   IV Drips: 600 mL NSS bolus  VITALS:    HR: 62           Rhythm: sinus rhythm       BP: 107/62   O2 SAT: 99% RA       PROCEDURE DETAILS:    Outcomes: See physician note                                Last ACT: No results found for: ISTATACTKAOL     Final Chest Pain Assessment:0/10    Report given to: Valera Castle RN room 365    See Physicians Op note/ Report for details

## 2022-01-08 NOTE — UM Notes (Addendum)
01/07/22 2032  Adult Admit to Observation  Once        Diagnosis: Chest Pain, Unspecified Type    Level of Care: Acute    Patient Class: Observation            UNIT: Cardiac Telemetry    A 46 y.o. male with past medical history of two-vessel coronary artery disease with coronary angiogram in March 2023 revealing two-vessel disease with 70% occlusion of the right OM and 100% occlusion on the right RCA with plans for cardiac catheterization for CTO PCI on April 14 who presents to the ER with worsening chest pain and tightness.  In the ED the patient was noted to be hemodynamically stable.  His lab work showed normal CBC, BMP with sodium 127, negative high-sensitivity troponin, chest x-ray with no acute disease.    Patient Vitals for the past 24 hrs: On RA    BP Temp Temp src Pulse Resp SpO2 Height Weight   01/07/22 1930 135/86 -- -- 68 16 98 % -- --   01/07/22 1841 (!) 146/92 98 F (36.7 C) Oral 76 17 98 % 1.88 m (6\' 2" ) 88.5 kg (195 lb)   01/07/22 1837 -- -- -- 79 -- 97 % -- --   Body mass index is 25.04 kg/m.    ECG :    NORMAL SINUS RHYTHM   POSSIBLE INFERIOR MYOCARDIAL INFARCTION (CITED ON OR BEFORE 07-Jan-2022)   ABNORMAL ECG   WHEN COMPARED WITH ECG OF 04-Oct-2021 21:36,   T WAVE INVERSION LESS EVIDENT IN INFERIOR LEADS   NONSPECIFIC T WAVE ABNORMALITY HAS REPLACED INVERTED T WAVES IN LATERAL LEADS        CXR--No acute disease    ABN LABS--NA 127  CL 95  BUN 7.0      PLAN:  Admit to telemetry  [facility] consulted by ED, will see the patient in the morning  We will hold patient n.p.o. in case cardiac catheterization can be pursued  Continue home medications  Patient's troponin negative x 2, no need to trend  Start heparin gtt  NPO for possible cath in AM   Patient declining nicotine patch at this time   Full code  DVT ppx: On heparin gtt    ++++++++++++++++++++++++++++++++++++++++++++  CSR for 01/08/2022    VS--T 97.9  P 54  BP 118/70  R 18  SpO2 98% (RA)    ABN LABS--NA 134    Current Facility-Administered  Medications   Medication Dose Route Frequency    aspirin  81 mg Oral Daily    atorvastatin  40 mg Oral QHS    isosorbide mononitrate  60 mg Oral Daily    metoprolol succinate XL  25 mg Oral Daily    ranolazine  1,000 mg Oral BID       heparin infusion 25,000 units/500 mL (Cardiac/Low Intensity) 14.3 Units/kg/hr (01/08/22 0740)      PER CARDIOLOGY CONSULT NOTE:  Reason for Consultation:   Chest pain  Assessment:   Chest pain with hospital admission 12/2021  Chest pain 09/2021 with both typical (elephant on chest) and atypical (reproducible on exam with palpation of chest) features, with serial hs-troponins negative despite many hours of pain (8 hs continuous prior to ED presentation)  CAD on the basis of coronary calcifications seen incidentally on CT chest 12/23 that ruled out dissection, central PE   Family history of CAD father's fatal MI at age 84  Patient remains hemodynamically and electrically stable  EKG does not meet  STEMI criteria   HS trop negative  D-Dimer slightly elevated  Discharged home on aspirin, statin, Imdur and BB   Exercise SPECT 11/2021 showing inferior and inferolateral wall ischemia.  Coronary angiogram with 2VD (OM1 70%, RCA CTO)  TTE 10/05/21 EF 54%, normal diastolic fxn, no sig valve dz    COVID detected on nasal swab, largely asymptomatic, no oxygen requirements  Tobacco use 1 ppd smoked (prior 3-4 ppd for 20+ years), and 2 cans of dip tobacco every three days  Daily EtOH 6-pack beer and more on weekends. He now is having 2 cases of beer a week.   Limited medical follow up- has not seen an outpatient doctor for 10 years   Recommendations:   Chest pain c/f Unstable angina  Chest pain yesterday with exertion at work with no resolution with SL nitro  Patient was scheduled for RCA CTO PCI +/- left circumflex PCI in same setting vs. Staged on 01/23/2022. LAD with moderate disease.   Given recurrent chest pain, worse then baseline on medical therapy recommend re-look angiogram + dFR LAD. If dFR  LAD positive, please have surgeons consult for CABG eval (RCA, circumflex-OM and LAD). If disease is stable and dFR negative, please optimize medical therapy and will plan for RCA CTO PCI and potential circumflex PCI on 01/23/2022.   HTN  Continue home regimen  HLD  Continue home regimen    PLAN PER MEDICINE:  -Appreciate [facility] input  Patient on schedule today for diagnostic coronary angiogram today + dFR LAD with Dr. Shon Baton at ~3;00pm  Per Cardio: If dFR LAD positive will be referred for CABG evaluation and recs below:     -Cont ASA,statin, imdur, toprol, ranexa  -Patient's troponin negative x 2, no need to trend  -On heparin gtt  -Nicotine patch added  -Keep NPO for procedures  -DVT ppx: On heparin gtt     UTILIZATION REVIEW CONTACT: Name: Kathlene Cote RNC BSN  Clinical Case Manager - Utilization Review   [facility] --[employer] [facility]  [address]  [place], [address] [place], [place] [address]  NPI: [id number]   Tax ID: [id number]   Phone: [phone]  Fax: [phone]     NOTES TO REVIEWER:    This clinical review is based on/compiled from documentation provided by the treatment team within the patient's medical record.

## 2022-01-08 NOTE — Plan of Care (Signed)
Medicine A Progress Note Addendum    Shift event:  Pt calm and cooperative. S/p eft heart cath. No complaints of chest pain. Heparin gtt discontinued. TR band in place with 12 cc air. Purposeful rounding,call bell within reach and fall risk interventions in place. Cardiac cleared for discharge, plan to [place] tomorrow morning.     Neuro: alert and oriented x4    Pain: no pain     Central Line: no central line    Drips: no drips    GU/ Foley: continent oob to bathroom independently     GI/ BMs: continent oob to bathroom independently     Skin: intact    Isolation: no isolation    Tele: sinus rhythm     Resp: room air    Mews: 1    Interpreter needed: no    Problem: Safety  Goal: Patient will be free from injury during hospitalization  Outcome: Progressing  Flowsheets (Taken 01/08/2022 0930)  Patient will be free from injury during hospitalization:   Provide and maintain safe environment   Assess patient's risk for falls and implement fall prevention plan of care per policy   Ensure appropriate safety devices are available at the bedside   Use appropriate transfer methods   Hourly rounding   Include patient/ family/ care giver in decisions related to safety  Goal: Patient will be free from infection during hospitalization  Outcome: Progressing  Flowsheets (Taken 01/08/2022 0930)  Free from Infection during hospitalization:   Assess and monitor for signs and symptoms of infection   Monitor lab/diagnostic results   Encourage patient and family to use good hand hygiene technique     Problem: Pain  Goal: Pain at adequate level as identified by patient  Outcome: Progressing  Flowsheets (Taken 01/08/2022 0930)  Pain at adequate level as identified by patient:   Identify patient comfort function goal   Assess pain on admission, during daily assessment and/or before any "as needed" intervention(s)   Reassess pain within 30-60 minutes of any procedure/intervention, per Pain Assessment, Intervention, Reassessment (AIR) Cycle    Evaluate if patient comfort function goal is met   Evaluate patient's satisfaction with pain management progress   Offer non-pharmacological pain management interventions     Problem: Bleeding  Goal: Excessive bleeding will be minimized  Outcome: Progressing  Flowsheets (Taken 01/08/2022 0930)  Excessive bleeding will be minimized:   Monitor for signs and symptoms of hypovolemia: tachycardia, tachypnea, decreased urine output, postural hypotension, confusion, syncope   Report signs of bleeding   Hold pressure/apply pressure dressing to any puncture sites   Avoid using a razor with blades for shaving     Problem: Chest Pain  Goal: Vital signs and cardiac rhythm stable  Outcome: Progressing  Flowsheets (Taken 01/08/2022 0932)  Vital signs and cardiac rhythm stable:   Monitor /assess vital signs/cardiac rhythms   Monitor labs   Assess the need for oxygen therapy and administer as ordered  Goal: Cardiac pain management  Outcome: Progressing  Flowsheets (Taken 01/08/2022 0932)  Cardiac pain management:   Assess/report chest pain/or related discomfort to LIP immediately   Instruct patient to report any change in pain status   Assess pain/or related discomfort on admission, during daily assessment, before and after any intervention   Include patient and patient care companion in decisions related to pain management  Goal: Anxiety management/effective coping  Outcome: Progressing  Flowsheets (Taken 01/08/2022 0932)  Anxiety management/effective coping:   Assess/report uncontrolled anxiety, depression or ineffective coping to  LIP   Encourage patient to immediately report any increase in anxiety and/or depression   Include patient in decision making of their care and give updates on their health status   Offer reassurance to decrease anxiety  Goal: Patient/Patient Care Companion demonstrates understanding of disease process, treatment plan, medications, and discharge plan  Outcome: Progressing  Flowsheets (Taken 01/08/2022  0932)  Patient/Patient Care Companion demonstrates understanding of disease process, treatment plan, medications and discharge plan:   Educate patient to immediately report any chest pain/equivalent to RN   Assist patient/patient care companion to identify measures for cardiac risk factor management   Assess need for smoking cessation and substance abuse counseling and refer as needed   Educate patient/patient care companion regarding identified learning needs   Provide appropriate resources and referrals

## 2022-01-09 ENCOUNTER — Encounter: Payer: Self-pay | Admitting: Interventional Cardiology

## 2022-01-09 ENCOUNTER — Telehealth (INDEPENDENT_AMBULATORY_CARE_PROVIDER_SITE_OTHER): Payer: Self-pay

## 2022-01-09 DIAGNOSIS — I2582 Chronic total occlusion of coronary artery: Secondary | ICD-10-CM

## 2022-01-09 DIAGNOSIS — I252 Old myocardial infarction: Secondary | ICD-10-CM

## 2022-01-09 DIAGNOSIS — R079 Chest pain, unspecified: Secondary | ICD-10-CM

## 2022-01-09 DIAGNOSIS — I2510 Atherosclerotic heart disease of native coronary artery without angina pectoris: Secondary | ICD-10-CM

## 2022-01-09 LAB — CBC AND DIFFERENTIAL
Absolute NRBC: 0 10*3/uL (ref 0.00–0.00)
Basophils Absolute Automated: 0.06 10*3/uL (ref 0.00–0.08)
Basophils Automated: 1.1 %
Eosinophils Absolute Automated: 0.87 10*3/uL — ABNORMAL HIGH (ref 0.00–0.44)
Eosinophils Automated: 15.5 %
Hematocrit: 40.2 % (ref 37.6–49.6)
Hgb: 14.3 g/dL (ref 12.5–17.1)
Immature Granulocytes Absolute: 0.02 10*3/uL (ref 0.00–0.07)
Immature Granulocytes: 0.4 %
Instrument Absolute Neutrophil Count: 2.72 10*3/uL (ref 1.10–6.33)
Lymphocytes Absolute Automated: 1.59 10*3/uL (ref 0.42–3.22)
Lymphocytes Automated: 28.4 %
MCH: 32.1 pg (ref 25.1–33.5)
MCHC: 35.6 g/dL (ref 31.5–35.8)
MCV: 90.3 fL (ref 78.0–96.0)
MPV: 8.8 fL — ABNORMAL LOW (ref 8.9–12.5)
Monocytes Absolute Automated: 0.34 10*3/uL (ref 0.21–0.85)
Monocytes: 6.1 %
Neutrophils Absolute: 2.72 10*3/uL (ref 1.10–6.33)
Neutrophils: 48.5 %
Nucleated RBC: 0 /100 WBC (ref 0.0–0.0)
Platelets: 184 10*3/uL (ref 142–346)
RBC: 4.45 10*6/uL (ref 4.20–5.90)
RDW: 13 % (ref 11–15)
WBC: 5.6 10*3/uL (ref 3.10–9.50)

## 2022-01-09 LAB — ECG 12-LEAD
Atrial Rate: 61 {beats}/min
IHS MUSE NARRATIVE AND IMPRESSION: NORMAL
P Axis: 56 degrees
P-R Interval: 184 ms
Q-T Interval: 430 ms
QRS Duration: 106 ms
QTC Calculation (Bezet): 432 ms
R Axis: 56 degrees
T Axis: -1 degrees
Ventricular Rate: 61 {beats}/min

## 2022-01-09 LAB — CBC
Absolute NRBC: 0 10*3/uL (ref 0.00–0.00)
Hematocrit: 40.5 % (ref 37.6–49.6)
Hgb: 14.2 g/dL (ref 12.5–17.1)
MCH: 31.6 pg (ref 25.1–33.5)
MCHC: 35.1 g/dL (ref 31.5–35.8)
MCV: 90.2 fL (ref 78.0–96.0)
MPV: 8.8 fL — ABNORMAL LOW (ref 8.9–12.5)
Nucleated RBC: 0 /100 WBC (ref 0.0–0.0)
Platelets: 181 10*3/uL (ref 142–346)
RBC: 4.49 10*6/uL (ref 4.20–5.90)
RDW: 13 % (ref 11–15)
WBC: 5.61 10*3/uL (ref 3.10–9.50)

## 2022-01-09 LAB — BASIC METABOLIC PANEL
Anion Gap: 8 (ref 5.0–15.0)
BUN: 10 mg/dL (ref 9.0–28.0)
CO2: 23 mEq/L (ref 17–29)
Calcium: 8.8 mg/dL (ref 8.5–10.5)
Chloride: 100 mEq/L (ref 99–111)
Creatinine: 1 mg/dL (ref 0.5–1.5)
Glucose: 85 mg/dL (ref 70–100)
Potassium: 4.4 mEq/L (ref 3.5–5.3)
Sodium: 131 mEq/L — ABNORMAL LOW (ref 135–145)

## 2022-01-09 LAB — GFR: EGFR: >60

## 2022-01-09 LAB — MAGNESIUM: Magnesium: 2.1 mg/dL (ref 1.6–2.6)

## 2022-01-09 MED ORDER — ISOSORBIDE MONONITRATE ER 30 MG PO TB24
90.0000 mg | ORAL_TABLET | Freq: Every day | ORAL | 0 refills | Status: DC
Start: 2022-01-09 — End: 2022-01-17

## 2022-01-09 NOTE — Discharge Instr - AVS First Page (Addendum)
Reason for your Hospital Admission:  You were admitted for evaluation of chest pain and seen by Cardiology. You had a left heart cath which showed known heart blockages. Your medications were adjusted to help with chest pain.       Instructions for after your discharge:    Please follow closely with Cardiology team and keep appointment on 01/23/22 for planned interventions for the heart vessel blockages.  Please take medications as directed.   Note that your Imdur has been increased to help with chest pain and you may use nitro sublingual as well as needed for recurrent chest pain

## 2022-01-09 NOTE — Discharge Summary -  Nursing (Signed)
Discharge Note:     Discharge location: Home    Discharge instructions, follow up, and medications reviewed and explained to patient, brother, and mother at bedside.     Prescriptions: Sent to home pharmacy    IV and tele removed. All belongings with patient. VSS.   Pt transported off of unit 1111 AM

## 2022-01-09 NOTE — Plan of Care (Addendum)
Shift Note:      Orientation: A&O x4  Pain: c/o mild-mod R arm pain was resolved w/ PRN tylenol   Rhythm on tele: SR  Ambulation: Ind   Lines/Drips: 20g LFA, 18g RAC   GI/GU: Cont x2, LBM 3/28  Fall Score: LOW      Critical Labs/Imaging/Procedures:   3/29- L heart cath     Comments:   - TR band was removed @2017 , 12 cc was removed no bleeding was noted, VSS, cap refill and radial pulses remained stable throughout removal.   - pt denies any chest pain throughout the shift   - see doc flow for complete assessment and vitals.     Significant Shift Events and Questions for Attending:    Plan:   - Vitals Q4H   - Monitor HR/Rhythm  - Monitor R radial site   - Pain management   - Poss D/C  3/30  - OP PCI 4/13      (Braden Score:22)

## 2022-01-09 NOTE — Discharge Summary (Signed)
Discharge Summary    Date:01/09/2022   Patient Name: Erik [name]  Attending Physician: Kateri Mc, DO    Date of Admission:   01/07/2022    Date of Discharge:   01/09/22    Admitting Diagnosis:   Chest pain    Discharge Dx:     Principal Diagnosis (Diagnosis after study, that is chiefly responsible for admission to inpatient status): Chest pain, unspecified type  #Unstable angina  #Ostial 40% stenosis in LAD  #40-50% mid LAD stenosis, positive by DFR (0.89) and FFR (0.79)  #70% OM1 stenosis.  #RCA CTO PCI   #Planned for staged PCI on 01/23/2022.   #LAD with moderate disease.   #HTN  #HLD      Treatment Team:   Treatment Team:   Attending Provider: Kateri Mc, DO     Procedures performed:   Radiology: all results from this admission  XR Chest  AP Portable    Result Date: 01/07/2022  1. No acute disease. Kennyth Lose, MD 01/07/2022 7:52 PM     Reason for Admission:   46 y.o. male with past medical history of two-vessel coronary artery disease with coronary angiogram in March 2023 revealing two-vessel disease with 70% occlusion of the right OM and 100% occlusion on the right RCA with plans for cardiac catheterization for CTO PCI on April 14 who presents to the ER with worsening chest pain and tightness.  He was seen in cardiology clinic yesterday at which point he noted chest pain and shortness of breath.  He has been medically optimized with Imdur, metoprolol, and Ranexa with persistent symptoms.  He is an active smoker with a 40-pack-year smoking history and uses 2 cans of dip tobacco every 3 days.  His father died of a fatal heart attack at the age of 15.  He has undergone echocardiogram on 12/he has undergone echocardiogram on 12/22 which showed an ejection fraction of 54% with normal diastolic function and no significant valvular disease.  Additionally, he was drinking 6 pack of beer daily with more on the weekends but has cut down now to having 2 cases of beer a week.  Of note, the patient had  radial artery spasm during his diagnostic catheterization and is planned to have a bifemoral access for CTO PCI on January 23, 2022.       Hospital Course:     The patient was admitted and was seen by Interventional Cardiology and structural heart. He had a LHC which showed Ostial 40% stenosis in LAD. 40-50% mid LAD stenosis, positive by DFR (0.89) and FFR (0.79), 70% OM1 stenosis. He was chest pain free. His medications were adjusted for chest pain, imdur was increased to 90mg  and along with BB, maximized ranexa dose appears to have controlled his chest pain well. He is planned for RCA CTO PCI +/- left circumflex PCI in same setting on 01/23/22.    He was cleared by Cardiology and deemed stable for discharge.     Condition at Discharge:   Stable.   Today:     BP 127/81   Pulse 62   Temp 98.1 F (36.7 C) (Oral)   Resp 18   Ht 1.88 m (6\' 2" )   Wt 79.5 kg (175 lb 3.2 oz)   SpO2 100%   BMI 22.49 kg/m   Ranges for the last 24 hours:  Temp:  [97.5 F (36.4 C)-98.4 F (36.9 C)] 98.1 F (36.7 C)  Heart Rate:  [54-75] 62  Resp Rate:  [12-18] 18  BP: (101-143)/(43-92) 127/81    Last set of labs   Recent Labs   Lab 01/09/22  0435   WBC 5.60  5.61   Hgb 14.3  14.2   Hematocrit 40.2  40.5   Platelets 184  181     Recent Labs   Lab 01/09/22  0435   Sodium 131*   Potassium 4.4   Chloride 100   CO2 23   BUN 10.0   Creatinine 1.0   EGFR >60.0   Glucose 85   Calcium 8.8     Recent Labs   Lab 01/07/22  2129   Bilirubin, Total 0.4   Bilirubin Direct 0.2   Protein, Total 7.0   Albumin 3.9   ALT 19   AST (SGOT) 26     Recent Labs   Lab 01/07/22  2051   PTT 30           Invalid input(s): FREET4      Recent Labs   Lab 01/07/22  2129 01/07/22  1919   hs Troponin-I <2.7 <2.7   hs Troponin-I Delta Unable toCalc.  --      Microbiology Results (last 15 days)       ** No results found for the last 360 hours. **            Micro / Labs / Path pending:     Unresulted Labs       None            Discharge Instructions For Providers      F/u closely Cardio recs  Staged PCI 01/23/22 with Dr. Elder Love              Discharge Instructions:      Follow-up Information       Cilia, Early Chars, MD Follow up on 01/23/2022.    Specialties: Interventional Cardiology, Internal Medicine, Cardiology  Contact information:  [address]  [place] [address]  [phone]                             Discharge Diet: Cardiac Diet  Puncture Site 01/08/22 Radial Right Arm (Active)   Site Assessment Clean;Dry;Intact 01/09/22 0742   Dressing Status Clean;Dry;Intact 01/09/22 0742   Drainage Amount None 01/09/22 0742   Drainage Description Dressing covering site (UTA) 01/09/22 0742   Pressure device present? No 01/09/22 0742   Number of days: 0        Disposition:  Home or Self Care     Discharge Medication List        Taking      aspirin 81 MG chewable tablet  Dose: 81 mg  What changed: when to take this  Chew 1 tablet (81 mg) by mouth daily     atorvastatin 40 MG tablet  Dose: 40 mg  Commonly known as: LIPITOR  Take 1 tablet (40 mg) by mouth nightly     isosorbide mononitrate 30 MG 24 hr tablet  Dose: 90 mg  What changed: how much to take  Commonly known as: IMDUR  Take 3 tablets (90 mg) by mouth daily     metoprolol succinate XL 25 MG 24 hr tablet  Dose: 25 mg  What changed: when to take this  Commonly known as: TOPROL-XL  Take 1 tablet (25 mg) by mouth daily     nitroglycerin 0.4 MG SL tablet  Dose: 0.4 mg  Commonly known as: NITROSTAT  Place 1 tablet (0.4 mg) under the tongue every 5 (five) minutes as needed for Chest pain May repeat up to 3x.  CALL 911 FOR UNRELIEVED CHEST PAIN     ranolazine 500 MG 12 hr tablet  Dose: 1,000 mg  Commonly known as: RANEXA  Take 2 tablets (1,000 mg) by mouth 2 (two) times daily            Minutes spent coordinating discharge and reviewing discharge plan: 70 minutes      Signed by: Kateri Mc, DO

## 2022-01-09 NOTE — Plan of Care (Signed)
Shift Note:      Orientation: AOx 4  Rhythm on tele: SA, HR 50s  Oxygen: RA  Ambulation: Independent  Pain: R arm pain w/ improvement, 2/10 chest discomfort relieved w/ AM meds  Lines/Drips: 18 RAC, 20 LFA  GI/GU: cont x2, LBM 3/28  Fall Score: High    Critical Labs/Imaging/Procedures:   3/29 - LHC    Comments:   - see doc flow for complete assessment and vitals.     Significant Shift Events and Questions for Attending:    Plan:   - Vitals Q4H   - Monitor BP, HR, and Rhythm  - Monitor for chest pain  - Monitor Rt arm pain  - Monitor Rt radial site  - Nicotine patch  - d/c today 3/30      (Braden Score: 22)

## 2022-01-09 NOTE — Progress Notes (Signed)
01/09/22 1122   Discharge Disposition   Physical Discharge Disposition Home   Mode of Transportation Car   Patient/Family/POA notified of transfer plan Yes   Patient agreeable to discharge plan/expected d/c date? Yes   Family/POA agreeable to discharge plan/expected d/c date? Yes   Bedside nurse notified of transport plan? Yes   CM Interventions   Follow up appointment scheduled? No  (Pt will make appt)   Notified MD? Yes   Multidisciplinary rounds/family meeting before d/c? No   Medicare Checklist   Is this a Medicare patient? No         Hasna Stefanik,BSN,RN,Case Manager  [facility]  [place], [place]  [phone]

## 2022-01-09 NOTE — Telephone Encounter (Signed)
Patient called stating he was d/c from [facility] today. Patient states he went to pick up medications from pharmacy.  Patient stated he thought Dr Elder Love was going to send in a strong Nitroglycerin medication for him.    I reviewed medications. I advised patient I think the isosorbide mononitrate 90 mg is the medication Dr Elder Love was telling him about.    Advised patient if there is any other medication Dr Elder Love was referring to I will call him back.    Patient v/u

## 2022-01-09 NOTE — UM Notes (Signed)
OBSERVATION CSR for 01/09/2022    Erik [name]  Male, 46 y.o., 1975/11/17    UNIT: Cardiac Telemetry    A 46 y.o. male with past medical history of two-vessel coronary artery disease with coronary angiogram in March 2023 revealing two-vessel disease with 70% occlusion of the right OM and 100% occlusion on the right RCA with plans for cardiac catheterization for CTO PCI on April 14 admitted as OBS on 3/28 for worsening chest pain and tightness.    VS--T 98.1  P 57  BP 129/83  R 18  SpO2 100% (RA)    Orientation: A&O x4  Pain: c/o mild-mod R arm pain was resolved w/ PRN tylenol   Rhythm on tele: SR  Ambulation: Ind   GI/GU: Cont x2, LBM 3/28    ABN LABS--NA 131    CARDIAC CATH LAB REPORT (01/08/2022)  PROCEDURES PERFORMED  Left heart cath  Coronary angiogram  FFR of LAD  INDICATION  Coronary artery disease  Chest pain  POST-PROCEDURE DIAGNOSIS  Ostial 40% stenosis in LAD.  40-50% mid LAD stenosis, positive by DFR (0.89) and FFR (0.79)  70% OM1 stenosis  Normal LV filling pressure without aortic stenosis  Known CTO of RCA (not injected)   FINDINGS  Left Heart Catheterization  LVEDP: 7 mmHg  No significant LV/AO gradient  Coronary Angiography  Right dominant system  Left Main: Large vessel that bifurcates into the LAD and left circumflex. Luminal irregularities <10%  Left anterior descending: Large vessel which courses along the anterior interventricular septum and wraps around the cardiac apex. Ostial 40% stenosis.  Proximal to mid LAD 40-50% stenosis. DFR .89.  FFR 0.79  Left Circumflex: Large vessel which courses along the left AV groove. Superior branch of OM1 with 70% proximal disease.  OM2 parent.  Right coronary artery: Not injected.  Known proximal RCA occlusion and the distal vessel fills via robust L to R collaterals.  SUMMARY  Ostial 40% stenosis in LAD.  40-50% mid LAD stenosis, positive by DFR (0.89) and FFR (0.79)  70% OM1 stenosis  Normal LV filling pressure without aortic stenosis  Known CTO of RCA (not  injected  Complications: None  RECOMMENDATIONS  CT surgery evaluation for multivessel CABG  Disposition: Back to floor.  Can be discharged later today if chest pain free.  Hemostasis: A TR band was placed for hemostasis.  IV Fluid: Normal Saline 100 ml/hr x 3 hours    PLAN:  Discharge to home today    UTILIZATION REVIEW CONTACT: Name: Kathlene Cote RNC BSN  Clinical Case Manager - Utilization Review   [facility] --[employer]  [address]  [place], [address] [place], [place] [address]  NPI: [id number]   Tax ID: [id number]   Phone: [phone]  Fax: [phone]     NOTES TO REVIEWER:    This clinical review is based on/compiled from documentation provided by the treatment team within the patient's medical record.

## 2022-01-17 ENCOUNTER — Encounter (INDEPENDENT_AMBULATORY_CARE_PROVIDER_SITE_OTHER): Payer: Self-pay

## 2022-01-17 ENCOUNTER — Emergency Department
Admission: EM | Admit: 2022-01-17 | Discharge: 2022-01-17 | Disposition: A | Discharge status: Home / Self Care | Payer: BC Managed Care – PPO | Attending: Emergency Medicine | Admitting: Emergency Medicine

## 2022-01-17 ENCOUNTER — Ambulatory Visit (INDEPENDENT_AMBULATORY_CARE_PROVIDER_SITE_OTHER): Payer: BC Managed Care – PPO

## 2022-01-17 ENCOUNTER — Emergency Department: Payer: BC Managed Care – PPO

## 2022-01-17 ENCOUNTER — Telehealth (INDEPENDENT_AMBULATORY_CARE_PROVIDER_SITE_OTHER): Payer: Self-pay

## 2022-01-17 DIAGNOSIS — I10 Essential (primary) hypertension: Secondary | ICD-10-CM | POA: Insufficient documentation

## 2022-01-17 DIAGNOSIS — R079 Chest pain, unspecified: Secondary | ICD-10-CM

## 2022-01-17 DIAGNOSIS — F17210 Nicotine dependence, cigarettes, uncomplicated: Secondary | ICD-10-CM | POA: Insufficient documentation

## 2022-01-17 DIAGNOSIS — I25110 Atherosclerotic heart disease of native coronary artery with unstable angina pectoris: Secondary | ICD-10-CM | POA: Insufficient documentation

## 2022-01-17 DIAGNOSIS — I2582 Chronic total occlusion of coronary artery: Secondary | ICD-10-CM

## 2022-01-17 DIAGNOSIS — I200 Unstable angina: Secondary | ICD-10-CM

## 2022-01-17 DIAGNOSIS — I25118 Atherosclerotic heart disease of native coronary artery with other forms of angina pectoris: Secondary | ICD-10-CM

## 2022-01-17 LAB — CBC AND DIFFERENTIAL
Absolute NRBC: 0 10*3/uL (ref 0.00–0.00)
Basophils Absolute Automated: 0.06 10*3/uL (ref 0.00–0.08)
Basophils Automated: 0.8 %
Eosinophils Absolute Automated: 0.24 10*3/uL (ref 0.00–0.44)
Eosinophils Automated: 3 %
Hematocrit: 38.6 % (ref 37.6–49.6)
Hgb: 14.2 g/dL (ref 12.5–17.1)
Immature Granulocytes Absolute: 0.1 10*3/uL — ABNORMAL HIGH (ref 0.00–0.07)
Immature Granulocytes: 1.3 %
Instrument Absolute Neutrophil Count: 5.44 10*3/uL (ref 1.10–6.33)
Lymphocytes Absolute Automated: 1.8 10*3/uL (ref 0.42–3.22)
Lymphocytes Automated: 22.7 %
MCH: 32.6 pg (ref 25.1–33.5)
MCHC: 36.8 g/dL — ABNORMAL HIGH (ref 31.5–35.8)
MCV: 88.7 fL (ref 78.0–96.0)
MPV: 8.4 fL — ABNORMAL LOW (ref 8.9–12.5)
Monocytes Absolute Automated: 0.3 10*3/uL (ref 0.21–0.85)
Monocytes: 3.8 %
Neutrophils Absolute: 5.44 10*3/uL (ref 1.10–6.33)
Neutrophils: 68.4 %
Nucleated RBC: 0 /100 WBC (ref 0.0–0.0)
Platelets: 208 10*3/uL (ref 142–346)
RBC: 4.35 10*6/uL (ref 4.20–5.90)
RDW: 12 % (ref 11–15)
WBC: 7.94 10*3/uL (ref 3.10–9.50)

## 2022-01-17 LAB — COMPREHENSIVE METABOLIC PANEL
ALT: 21 U/L (ref 0–55)
AST (SGOT): 18 U/L (ref 5–41)
Albumin/Globulin Ratio: 1.3 (ref 0.9–2.2)
Albumin: 3.8 g/dL (ref 3.5–5.0)
Alkaline Phosphatase: 67 U/L (ref 37–117)
Anion Gap: 10 (ref 5.0–15.0)
BUN: 8 mg/dL — ABNORMAL LOW (ref 9.0–28.0)
Bilirubin, Total: 0.3 mg/dL (ref 0.2–1.2)
CO2: 21 mEq/L (ref 17–29)
Calcium: 8.8 mg/dL (ref 8.5–10.5)
Chloride: 96 mEq/L — ABNORMAL LOW (ref 99–111)
Creatinine: 0.9 mg/dL (ref 0.5–1.5)
Globulin: 3 g/dL (ref 2.0–3.6)
Glucose: 216 mg/dL — ABNORMAL HIGH (ref 70–100)
Potassium: 4.1 mEq/L (ref 3.5–5.3)
Protein, Total: 6.8 g/dL (ref 6.0–8.3)
Sodium: 127 mEq/L — ABNORMAL LOW (ref 135–145)

## 2022-01-17 LAB — PT AND APTT
PT INR: 0.9 (ref 0.9–1.1)
PT: 10.6 s (ref 10.1–12.9)
PTT: 28 s (ref 27–39)

## 2022-01-17 LAB — HIGH SENSITIVITY TROPONIN-I: hs Troponin-I: 3.9 ng/L

## 2022-01-17 LAB — GFR: EGFR: >60

## 2022-01-17 LAB — PROBNP: NT-proBNP: 96 pg/mL (ref 0–125)

## 2022-01-17 LAB — ECG 12-LEAD: QTC Calculation (Bezet): 444 ms

## 2022-01-17 MED ORDER — ISOSORBIDE MONONITRATE ER 30 MG PO TB24
120.0000 mg | ORAL_TABLET | Freq: Every day | ORAL | 0 refills | Status: DC
Start: 2022-01-17 — End: 2022-01-21

## 2022-01-17 MED ORDER — AMLODIPINE BESYLATE 5 MG PO TABS
5.0000 mg | ORAL_TABLET | Freq: Every day | ORAL | 0 refills | Status: DC
Start: 2022-01-17 — End: 2022-01-20

## 2022-01-17 NOTE — ED Notes (Signed)
I have assisted in the ordering of diagnostic studies necessary to expedite care. I am not the primary provider for this patient. Please refer to the provider/reassessment/disposition notes for further information about this patient's emergency department course.       Marko Stai, MD  01/17/22 [phone]

## 2022-01-17 NOTE — Telephone Encounter (Signed)
Pt called into POD today stating he has been having chest pain all morning. Pt took nitro at 1:15pm without relief. Pts BP at that time was 149/85 with heart rate of 195bpm. Pt questions if he can take another nitro. Pt took BP and pulse again while on the phone and pulse now just showing "HIGH". Pt states he has cath scheduled for 01/23/22.     Advised patient to call 911 immediately. Pt agreeable to plan and verbalized understanding.

## 2022-01-17 NOTE — ED Triage Notes (Signed)
Patient presented to ED via EMS with c/o mid to left side chest pain, took nitro x 1 with no relief, call cardiologist office who suggested to call 911 and get to ED. Was given aspirin 81 mg x 2 per EMS. Denies chest pain/discomfort at this time. Is scheduled for cardiac stent placement next week.

## 2022-01-17 NOTE — Discharge Instructions (Signed)
Increase Imdur to 120 mg daily  Add amlodipine 5 mg daily  Return for worsening chest pain  Followup for stent placement next week   Followup with cardiology as per their instructions

## 2022-01-17 NOTE — ED Provider Notes (Signed)
[facility] EMERGENCY DEPARTMENT H&P      Visit date: 01/17/2022      CLINICAL SUMMARY           Diagnosis:    .     Final diagnoses:   Unstable angina         MDM Notes:        Consultant Discussion: I discussed management with cardiology. They agree with current plan of care and will evaluate the patient.      Medical Decision Making  Risk  Prescription drug management.    Known unstable angina pending PCI - seen by cardiology. Meds adjusted.  Pt comfortale with discharge. Will followup next week         Disposition:      Discharge         Discharge Prescriptions       Medication Sig Dispense Auth. Provider    isosorbide mononitrate (IMDUR) 30 MG 24 hr tablet Take 4 tablets (120 mg) by mouth daily 90 tablet Archie Balboa, Jazlyne Gauger Beverely Risen., MD    amLODIPine (NORVASC) 5 MG tablet Take 1 tablet (5 mg) by mouth daily 30 tablet Archie Balboa, [place] Beverely Risen., MD                        CLINICAL INFORMATION        HPI:      Chief Complaint: Chest Pain  .    Erik [name] is a 46 y.o. male who presents with recurrent chest pain.Pt has known unstable angina, recent cath showing several lesions. Scheduled for PCI of RCA on 4/14.  Today had chest pain and home monitor noted HR of 190, then too high to read. Pt is now pain free. Advice nurse advised to come to ED     History obtained from: Patient          ROS:      Positive and negative ROS elements as per HPI.  All other systems reviewed and negative.      Physical Exam:      Pulse 82  BP 133/80  Resp 16  SpO2 98 %  Temp 98 F (36.7 C)    Physical Exam  Vitals and nursing note reviewed.   Constitutional:       Appearance: He is well-developed.   HENT:      Head: Normocephalic and atraumatic.   Cardiovascular:      Rate and Rhythm: Normal rate and regular rhythm.   Pulmonary:      Effort: Pulmonary effort is normal.      Breath sounds: Normal breath sounds.   Abdominal:      Palpations: Abdomen is soft.      Tenderness: There is no abdominal  tenderness. There is no guarding.   Musculoskeletal:         General: Normal range of motion.      Cervical back: Normal range of motion and neck supple.      Right lower leg: No tenderness. No edema.      Left lower leg: No tenderness. No edema.   Skin:     General: Skin is warm and dry.   Neurological:      General: No focal deficit present.      Mental Status: He is alert and oriented to person, place, and time.   Psychiatric:         Mood and Affect: Mood normal.  Behavior: Behavior normal.                   PAST HISTORY        Primary Care Provider: Pcp, None, MD        PMH/PSH:    .     Past Medical History:   Diagnosis Date    Chest pain 12/10/2021    COVID 09/2021    11/2021: asymptomatic    Hearing loss in Left ear     History of echocardiogram 10/05/2021    EF 54    History of nuclear stress test: exercise SPECT abn 11/22/2021    Hyperlipidemia     on Rx    TBI (traumatic brain injury) 1999       He has a past surgical history that includes Dental surgery; LHC w/ Coronary Angios and LV (Left, 12/12/2021); and LHC w/ Coronary Angios and LV (Left, 01/08/2022).      Social/Family History:      He reports that he has been smoking cigarettes. He has a 40.00 pack-year smoking history. He has never used smokeless tobacco. He reports current alcohol use of about 66.0 standard drinks of alcohol per week. He reports that he does not use drugs.    Family History   Problem Relation Age of Onset    Hypothyroidism Mother     Heart attack Father     Hyperlipidemia Sister     Hypertension Sister     Diabetes Brother     COPD Brother     Heart attack Maternal Grandfather          Listed Medications on Arrival:    .     Home Medications       Med List Status: In Progress Set By: Inda Coke, RN at 01/17/2022  4:17 PM              aspirin 81 MG chewable tablet     Chew 1 tablet (81 mg) by mouth daily     Patient taking differently: Chew 81 mg by mouth every morning     atorvastatin (LIPITOR) 40 MG tablet     Take 1  tablet (40 mg) by mouth nightly     isosorbide mononitrate (IMDUR) 30 MG 24 hr tablet     Take 4 tablets (120 mg) by mouth daily     metoprolol succinate XL (TOPROL-XL) 25 MG 24 hr tablet     Take 1 tablet (25 mg) by mouth daily     Patient taking differently: Take 25 mg by mouth every morning     nitroglycerin (NITROSTAT) 0.4 MG SL tablet     Place 1 tablet (0.4 mg) under the tongue every 5 (five) minutes as needed for Chest pain May repeat up to 3x.  CALL 911 FOR UNRELIEVED CHEST PAIN     ranolazine (RANEXA) 500 MG 12 hr tablet     Take 2 tablets (1,000 mg) by mouth 2 (two) times daily               Allergies: He has No Known Allergies.            VISIT INFORMATION        Clinical Course in the ED:    1545 - D/W [facility] NP, will see and evaluate   Heart would like to increase Imdur to 120 qd and add amlodipine 5 qd           Medications Given in the  ED:    .     ED Medication Orders (From admission, onward)      None              Procedures:      Procedures      Interpretations:      EKG: I reviewed and Independently interpreted the patient's EKG as normal sinus at 84.  Compared to prior EKG no significant change              RESULTS        Lab Results:      Results       Procedure Component Value Units Date/Time    PT/APTT [[id number]] Collected: 01/17/22 1530     Updated: 01/17/22 1632     PT 10.6 sec      PT INR 0.9     PTT 28 sec     NT-proBNP [[id number]] Collected: 01/17/22 1530     Updated: 01/17/22 1550    High Sensitivity Troponin-I [[id number]] Collected: 01/17/22 1412    Specimen: Blood Updated: 01/17/22 1506     hs Troponin-I 3.9 ng/L     Comprehensive metabolic panel [[id number]]  (Abnormal) Collected: 01/17/22 1412    Specimen: Blood Updated: 01/17/22 1501     Glucose 216 mg/dL      BUN 8.0 mg/dL      Creatinine 0.9 mg/dL      Sodium 629 mEq/L      Potassium 4.1 mEq/L      Chloride 96 mEq/L      CO2 21 mEq/L      Calcium 8.8 mg/dL      Protein, Total 6.8 g/dL      Albumin 3.8 g/dL      AST (SGOT) 18  U/L      ALT 21 U/L      Alkaline Phosphatase 67 U/L      Bilirubin, Total 0.3 mg/dL      Globulin 3.0 g/dL      Albumin/Globulin Ratio 1.3     Anion Gap 10.0    GFR [[id number]] Collected: 01/17/22 1412     Updated: 01/17/22 1501     EGFR >60.0       CBC and differential [[id number]]  (Abnormal) Collected: 01/17/22 1412    Specimen: Blood Updated: 01/17/22 1449     WBC 7.94 x10 3/uL      Hgb 14.2 g/dL      Hematocrit 64.4 %      Platelets 208 x10 3/uL      RBC 4.35 x10 6/uL      MCV 88.7 fL      MCH 32.6 pg      MCHC 36.8 g/dL      RDW 12 %      MPV 8.4 fL      Instrument Absolute Neutrophil Count 5.44 x10 3/uL      Neutrophils 68.4 %      Lymphocytes Automated 22.7 %      Monocytes 3.8 %      Eosinophils Automated 3.0 %      Basophils Automated 0.8 %      Immature Granulocytes 1.3 %      Nucleated RBC 0.0 /100 WBC      Neutrophils Absolute 5.44 x10 3/uL      Lymphocytes Absolute Automated 1.80 x10 3/uL      Monocytes Absolute Automated 0.30 x10 3/uL      Eosinophils Absolute Automated 0.24 x10 3/uL  Basophils Absolute Automated 0.06 x10 3/uL      Immature Granulocytes Absolute 0.10 x10 3/uL      Absolute NRBC 0.00 x10 3/uL                 Radiology Results:      XR Chest  AP Portable   Final Result      No acute cardiopulmonary disease.      Collene Schlichter, MD   01/17/2022 3:02 PM                  Scribe Attestation:      No scribe involved in the care of this patient

## 2022-01-17 NOTE — Consults (Signed)
[facility] CARDIOLOGY CONSULTATION REPORT    [facility]  [address]  [place] [address]  Dept: [phone]  Dept Fax: [phone]  Loc: [phone]      Date Time: 01/17/22 3:44 PM  Patient Name: Erik [name]  Requesting Physician: Lind Covert*    Reason for Consultation:   Chest pain    History of Present Illness:   Erik [name] is a 46 y.o. male admitted on 01/17/2022.  We have been asked by Lind Covert* to provide cardiac consultation regarding chest pain.    He is known to our practice and has a history of coronary artery disease with known RCA CTO, hypertension, and hyperlipidemia.  He was just recently admitted to the hospital from March 28 through January 09, 2022.  He had a repeat coronary angiogram on January 07, 2022 by Dr. Letta Median El-Haddad.  Cath at that time revealed 40% ostial stenosis in the LAD, 40 to 50% mid LAD stenosis which was positive by DFR (0.89) and FFR (0.79), 70% OM1 stenosis, and a known CTO of the RCA.  After multidisciplinary discussion between CT surgery and interventional cardiology, plan was to set up for a complex CTO on January 23, 2022.    He now presents to the ER after calling in to our nurse triage line with concerns regarding chest pain all morning. He states the pain was a dull nagging pain that began at 6am. He reported that he took nitro at 1:15 PM without relief. He was also concerned as his heart rate was elevated to 195 bpm on his home blood pressure cuff.  He was advised to come to the ER.    Here in the ER his EKG is actually sinus rhythm with a heart rate of 84 bpm.  His EKG is not significantly changed from prior EKGs.  His blood pressure is 133/80.  Initial high-sensitivity troponin of 3.9.  Other labs in process.    Currently, he is chest pain free. He states he just feels tired. He states he would prefer not to wait until the hospital until Thursday.     Cardiology relevant  medical records in Epic chart reviewed.    Past Medical/Surgical History:   Patient  has a past medical history of Chest pain (12/10/2021), COVID (09/2021), Hearing loss in Left ear, History of echocardiogram (10/05/2021), History of nuclear stress test: exercise SPECT abn (11/22/2021), Hyperlipidemia, and TBI (traumatic brain injury) (1999).    Patient  has a past surgical history that includes Dental surgery; LHC w/ Coronary Angios and LV (Left, 12/12/2021); and LHC w/ Coronary Angios and LV (Left, 01/08/2022).    Family History:   Patient's family history includes COPD in his brother; Diabetes in his brother; Heart attack in his father and maternal grandfather; Hyperlipidemia in his sister; Hypertension in his sister; Hypothyroidism in his mother.    Social History:   Patient  reports that he has been smoking cigarettes. He has a 20.00 pack-year smoking history. He has never used smokeless tobacco. He reports current alcohol use of about 66.0 standard drinks of alcohol per week. He reports that he does not use drugs.    Allergies:   No Known Allergies    Medications:     Current Outpatient Medications   Medication Instructions    aspirin 81 mg, Oral, Daily    atorvastatin (LIPITOR) 40 mg, Oral, At bedtime    isosorbide mononitrate (IMDUR) 90 mg, Oral,  Daily    metoprolol succinate XL (TOPROL-XL) 25 mg, Oral, Daily    nitroglycerin (NITROSTAT) 0.4 mg, Sublingual, Every 5 min PRN, May repeat up to 3x.  CALL 911 FOR UNRELIEVED CHEST PAIN    ranolazine (RANEXA) 1,000 mg, Oral, 2 times daily       Inpatient Scheduled Meds: PRN Meds:        Continuous Infusions:         Physical Exam:     Vitals:    01/17/22 1401   BP: 133/80   Pulse: 82   Resp: 16   Temp: 98 F (36.7 C)   SpO2: 98%   Weight: 79.4 kg (175 lb)   Height: 1.88 m (6\' 2" )       Constitutional: Cooperative, alert, no acute distress.  Neck: No carotid bruits, JVP normal.  Cardiac: Regular rate and rhythm, normal S1 and S2; no S3 or S4, no murmurs, no rubs, no  gallops.  Pulmonary: Clear to auscultation bilaterally, no wheezing, no rhonchi, no rales.  Extremities: no edema.  Vascular: +2 pulses in radial artery bilaterally, 2+ pedal pulses bilaterally.    Lab/Test Findings:     ECG reviewed: SR, non specific ST changes unchanged from previous.  CXR reviewed:   XR Chest  AP Portable    Result Date: 01/17/2022  No acute cardiopulmonary disease. Collene Schlichter, MD 01/17/2022 3:02 PM       Recent Labs     01/17/22  1412   Sodium 127*   Potassium 4.1   Chloride 96*   CO2 21   BUN 8.0*   Creatinine 0.9   Calcium 8.8   WBC 7.94   Hgb 14.2   Hematocrit 38.6   Platelets 208   hs Troponin-I 3.9   AST (SGOT) 18   ALT 21       ASSESSMENT:   Patient is a 46 y.o. male with the following relevant diagnoses:    Presented with chest pain but negative high-sensitivity troponin.  Pain started at approximately 6 AM  Known coronary artery disease with recent cath 01/08/2022, known RCA CTO with plans for CTO PCI on January 23, 2022  Ostial 40% stenosis in LAD.  40-50% mid LAD stenosis, positive by DFR (0.89) and FFR (0.79), 70% OM1 stenosis, CTO of RCA  TTE 10/05/21 EF 54%, normal diastolic fxn, no sig valve dz    Hyperlipidemia  Tobacco use 1 ppd smoked (prior 3-4 ppd for 20+ years), and 2 cans of dip tobacco every three days  Daily EtOH 6-pack beer and more on weekends. He now is having 2 cases of beer a week.     RECOMMENDATIONS:     Increase Imdur to 120 mg daily.  Start Norvasc 5 mg daily.  Continue current dose of Ranexa and metoprolol.  Continue aspirin and statin.  Okay for discharge from a cardiac standpoint with plans to return for RCA CTO PCI attempt as scheduled January 23, 2022. Asked him to avoid strenuous activity until then. Continue PRN nitro use. Return for accelerating symptoms.      Recommendations discussed with primary medical team.  -------------------------------------------------------------------------------------  Signed by:         Queen Slough, NP         [facility]    [facility] Heart Contact Information   [facility]  [place] [place] (Group):   [facility] [place] [facility]    APP Spectralink:  [phone]    MD Spectralink :  [phone]  [phone]  After hours, non urgent consult line:  [phone]    After hours, physician on-call:  [place] (Group):   [facility] [place] [facility]    APP [place]:  [phone]    MD [place] :  [phone]      After hours, non urgent consult line:  [phone]    After hours, physician on-call:  [place] (Group):   [facility]    APP [place]:  [phone]    MD [place] :  [phone]      After hours, non urgent consult line:  [phone]    After hours, physician on-call:  [place] (Group):   [place]:  [phone]    MD [place] :  [phone]      After hours, non urgent consult line:  [phone]    After hours, physician on-call:  [phone]       This note was generated by the Dragon speech recognition and may contain errors or omissions not intended by the user. Grammatical errors, random word insertions, deletions, pronoun errors, and incomplete sentences are occasional consequences of this technology due to software limitations. Not all errors are caught or corrected. If there are questions or concerns about the content of this note or information contained within the body of this dictation, they should be addressed directly with the author for clarification.

## 2022-01-18 LAB — ECG 12-LEAD
Atrial Rate: 84 {beats}/min
IHS MUSE NARRATIVE AND IMPRESSION: NORMAL
P Axis: 64 degrees
P-R Interval: 182 ms
Q-T Interval: 376 ms
QRS Duration: 106 ms
R Axis: 70 degrees
T Axis: 49 degrees
Ventricular Rate: 84 {beats}/min

## 2022-01-20 ENCOUNTER — Telehealth (INDEPENDENT_AMBULATORY_CARE_PROVIDER_SITE_OTHER): Payer: Self-pay

## 2022-01-20 ENCOUNTER — Emergency Department: Payer: Commercial Managed Care - POS

## 2022-01-20 ENCOUNTER — Observation Stay
Admission: EM | Admit: 2022-01-20 | Discharge: 2022-01-21 | Disposition: A | Discharge status: Home / Self Care | Payer: Commercial Managed Care - POS | Attending: Internal Medicine | Admitting: Internal Medicine

## 2022-01-20 DIAGNOSIS — R079 Chest pain, unspecified: Secondary | ICD-10-CM

## 2022-01-20 DIAGNOSIS — Z7982 Long term (current) use of aspirin: Secondary | ICD-10-CM | POA: Insufficient documentation

## 2022-01-20 DIAGNOSIS — I160 Hypertensive urgency: Secondary | ICD-10-CM | POA: Insufficient documentation

## 2022-01-20 DIAGNOSIS — I10 Essential (primary) hypertension: Secondary | ICD-10-CM | POA: Insufficient documentation

## 2022-01-20 DIAGNOSIS — F1010 Alcohol abuse, uncomplicated: Secondary | ICD-10-CM

## 2022-01-20 DIAGNOSIS — F32A Depression, unspecified: Secondary | ICD-10-CM | POA: Insufficient documentation

## 2022-01-20 DIAGNOSIS — Z8249 Family history of ischemic heart disease and other diseases of the circulatory system: Secondary | ICD-10-CM | POA: Insufficient documentation

## 2022-01-20 DIAGNOSIS — R9431 Abnormal electrocardiogram [ECG] [EKG]: Secondary | ICD-10-CM

## 2022-01-20 DIAGNOSIS — E871 Hypo-osmolality and hyponatremia: Secondary | ICD-10-CM | POA: Insufficient documentation

## 2022-01-20 DIAGNOSIS — I2510 Atherosclerotic heart disease of native coronary artery without angina pectoris: Secondary | ICD-10-CM | POA: Insufficient documentation

## 2022-01-20 DIAGNOSIS — F1090 Alcohol use, unspecified, uncomplicated: Secondary | ICD-10-CM | POA: Insufficient documentation

## 2022-01-20 DIAGNOSIS — E782 Mixed hyperlipidemia: Secondary | ICD-10-CM

## 2022-01-20 DIAGNOSIS — Z87820 Personal history of traumatic brain injury: Secondary | ICD-10-CM | POA: Insufficient documentation

## 2022-01-20 DIAGNOSIS — F17210 Nicotine dependence, cigarettes, uncomplicated: Secondary | ICD-10-CM | POA: Insufficient documentation

## 2022-01-20 DIAGNOSIS — R0789 Other chest pain: Principal | ICD-10-CM | POA: Insufficient documentation

## 2022-01-20 DIAGNOSIS — Z01818 Encounter for other preprocedural examination: Secondary | ICD-10-CM

## 2022-01-20 DIAGNOSIS — E785 Hyperlipidemia, unspecified: Secondary | ICD-10-CM | POA: Insufficient documentation

## 2022-01-20 DIAGNOSIS — Z79899 Other long term (current) drug therapy: Secondary | ICD-10-CM | POA: Insufficient documentation

## 2022-01-20 LAB — URINALYSIS WITH MICROSCOPIC
Bilirubin, UA: NEGATIVE
Blood, UA: NEGATIVE
Glucose, UA: NEGATIVE
Ketones UA: NEGATIVE
Leukocyte Esterase, UA: NEGATIVE
Nitrite, UA: NEGATIVE
Protein, UR: NEGATIVE
Specific Gravity UA: 1.007 (ref 1.001–1.035)
Urine pH: 7 (ref 5.0–8.0)
Urobilinogen, UA: NORMAL mg/dL (ref 0.2–2.0)

## 2022-01-20 LAB — COMPREHENSIVE METABOLIC PANEL
ALT: 24 U/L (ref 0–55)
AST (SGOT): 19 U/L (ref 5–41)
Albumin/Globulin Ratio: 1.5 (ref 0.9–2.2)
Albumin: 4.2 g/dL (ref 3.5–5.0)
Alkaline Phosphatase: 65 U/L (ref 37–117)
Anion Gap: 9 (ref 5.0–15.0)
BUN: 6 mg/dL — ABNORMAL LOW (ref 9.0–28.0)
Bilirubin, Total: 0.5 mg/dL (ref 0.2–1.2)
CO2: 25 mEq/L (ref 17–29)
Calcium: 9.1 mg/dL (ref 8.5–10.5)
Chloride: 94 mEq/L — ABNORMAL LOW (ref 99–111)
Creatinine: 0.9 mg/dL (ref 0.5–1.5)
Globulin: 2.8 g/dL (ref 2.0–3.6)
Glucose: 97 mg/dL (ref 70–100)
Potassium: 4.5 mEq/L (ref 3.5–5.3)
Protein, Total: 7 g/dL (ref 6.0–8.3)
Sodium: 128 mEq/L — ABNORMAL LOW (ref 135–145)

## 2022-01-20 LAB — ECG 12-LEAD
Atrial Rate: 72 {beats}/min
IHS MUSE NARRATIVE AND IMPRESSION: NORMAL
P Axis: 56 degrees
P-R Interval: 174 ms
Q-T Interval: 398 ms
QRS Duration: 110 ms
QTC Calculation (Bezet): 435 ms
R Axis: 65 degrees
T Axis: 26 degrees
Ventricular Rate: 72 {beats}/min

## 2022-01-20 LAB — OSMOLALITY, URINE: Urine Osmolality: 160 mosm/kg — ABNORMAL LOW (ref 300–1094)

## 2022-01-20 LAB — HIGH SENSITIVITY TROPONIN-I
hs Troponin-I: <2.7 ng/L
hs Troponin-I: <2.7 ng/L

## 2022-01-20 LAB — SODIUM, URINE, RANDOM: Urine Sodium Random: 48 mEq/L

## 2022-01-20 LAB — CBC AND DIFFERENTIAL
Absolute NRBC: 0 10*3/uL (ref 0.00–0.00)
Basophils Absolute Automated: 0.07 10*3/uL (ref 0.00–0.08)
Basophils Automated: 0.8 %
Eosinophils Absolute Automated: 0.18 10*3/uL (ref 0.00–0.44)
Eosinophils Automated: 2.1 %
Hematocrit: 39 % (ref 37.6–49.6)
Hgb: 14.2 g/dL (ref 12.5–17.1)
Immature Granulocytes Absolute: 0.03 10*3/uL (ref 0.00–0.07)
Immature Granulocytes: 0.3 %
Instrument Absolute Neutrophil Count: 5.88 10*3/uL (ref 1.10–6.33)
Lymphocytes Absolute Automated: 1.92 10*3/uL (ref 0.42–3.22)
Lymphocytes Automated: 22.3 %
MCH: 32.1 pg (ref 25.1–33.5)
MCHC: 36.4 g/dL — ABNORMAL HIGH (ref 31.5–35.8)
MCV: 88.2 fL (ref 78.0–96.0)
MPV: 8.2 fL — ABNORMAL LOW (ref 8.9–12.5)
Monocytes Absolute Automated: 0.54 10*3/uL (ref 0.21–0.85)
Monocytes: 6.3 %
Neutrophils Absolute: 5.88 10*3/uL (ref 1.10–6.33)
Neutrophils: 68.2 %
Nucleated RBC: 0 /100 WBC (ref 0.0–0.0)
Platelets: 223 10*3/uL (ref 142–346)
RBC: 4.42 10*6/uL (ref 4.20–5.90)
RDW: 12 % (ref 11–15)
WBC: 8.62 10*3/uL (ref 3.10–9.50)

## 2022-01-20 LAB — GFR: EGFR: >60

## 2022-01-20 MED ORDER — ATORVASTATIN CALCIUM 20 MG PO TABS
40.0000 mg | ORAL_TABLET | Freq: Every evening | ORAL | Status: DC
Start: 2022-01-20 — End: 2022-01-21
  Administered 2022-01-20: 40 mg via ORAL
  Filled 2022-01-20: qty 2

## 2022-01-20 MED ORDER — NALOXONE HCL 0.4 MG/ML IJ SOLN (WRAP)
0.2000 mg | INTRAMUSCULAR | Status: DC | PRN
Start: 2022-01-20 — End: 2022-01-21

## 2022-01-20 MED ORDER — POTASSIUM CHLORIDE 10 MEQ/100ML IV SOLN
10.0000 meq | INTRAVENOUS | Status: DC | PRN
Start: 2022-01-20 — End: 2022-01-21

## 2022-01-20 MED ORDER — ACETAMINOPHEN 650 MG RE SUPP
650.0000 mg | Freq: Four times a day (QID) | RECTAL | Status: DC | PRN
Start: 2022-01-20 — End: 2022-01-21

## 2022-01-20 MED ORDER — POTASSIUM CHLORIDE CRYS ER 20 MEQ PO TBCR
0.0000 meq | EXTENDED_RELEASE_TABLET | ORAL | Status: DC | PRN
Start: 2022-01-20 — End: 2022-01-21

## 2022-01-20 MED ORDER — LIDOCAINE VISCOUS HCL 2 % MT SOLN
10.0000 mL | Freq: Once | OROMUCOSAL | Status: DC
Start: 2022-01-20 — End: 2022-01-21
  Filled 2022-01-20: qty 10

## 2022-01-20 MED ORDER — MELATONIN 3 MG PO TABS
3.0000 mg | ORAL_TABLET | Freq: Every evening | ORAL | Status: DC | PRN
Start: 2022-01-20 — End: 2022-01-21

## 2022-01-20 MED ORDER — ONDANSETRON HCL 4 MG/2ML IJ SOLN
4.0000 mg | Freq: Four times a day (QID) | INTRAMUSCULAR | Status: DC | PRN
Start: 2022-01-20 — End: 2022-01-21

## 2022-01-20 MED ORDER — DEXTROSE 10 % IV BOLUS
12.5000 g | INTRAVENOUS | Status: DC | PRN
Start: 2022-01-20 — End: 2022-01-21

## 2022-01-20 MED ORDER — MAGNESIUM SULFATE IN D5W 1-5 GM/100ML-% IV SOLN
1.0000 g | INTRAVENOUS | Status: DC | PRN
Start: 2022-01-20 — End: 2022-01-21

## 2022-01-20 MED ORDER — GLUCAGON 1 MG IJ SOLR (WRAP)
1.0000 mg | INTRAMUSCULAR | Status: DC | PRN
Start: 2022-01-20 — End: 2022-01-21

## 2022-01-20 MED ORDER — FAMOTIDINE 20 MG PO TABS
20.0000 mg | ORAL_TABLET | Freq: Two times a day (BID) | ORAL | Status: DC
Start: 2022-01-20 — End: 2022-01-21
  Administered 2022-01-20 – 2022-01-21 (×2): 20 mg via ORAL
  Filled 2022-01-20 (×3): qty 1

## 2022-01-20 MED ORDER — DEXTROSE 50 % IV SOLN
12.5000 g | INTRAVENOUS | Status: DC | PRN
Start: 2022-01-20 — End: 2022-01-21

## 2022-01-20 MED ORDER — NITROGLYCERIN 0.4 MG SL SUBL
0.4000 mg | SUBLINGUAL_TABLET | SUBLINGUAL | Status: DC | PRN
Start: 2022-01-20 — End: 2022-01-21

## 2022-01-20 MED ORDER — ENOXAPARIN SODIUM 40 MG/0.4ML IJ SOSY
40.0000 mg | PREFILLED_SYRINGE | Freq: Every day | INTRAMUSCULAR | Status: DC
Start: 2022-01-20 — End: 2022-01-21
  Administered 2022-01-20 – 2022-01-21 (×2): 40 mg via SUBCUTANEOUS
  Filled 2022-01-20 (×2): qty 0.4

## 2022-01-20 MED ORDER — ONDANSETRON 4 MG PO TBDP
4.0000 mg | ORAL_TABLET | Freq: Four times a day (QID) | ORAL | Status: DC | PRN
Start: 2022-01-20 — End: 2022-01-21

## 2022-01-20 MED ORDER — ALUM & MAG HYDROXIDE-SIMETH 200-200-20 MG/5ML PO SUSP
30.0000 mL | Freq: Once | ORAL | Status: DC | PRN
Start: 2022-01-20 — End: 2022-01-21

## 2022-01-20 MED ORDER — ACETAMINOPHEN 325 MG PO TABS
650.0000 mg | ORAL_TABLET | Freq: Four times a day (QID) | ORAL | Status: DC | PRN
Start: 2022-01-20 — End: 2022-01-21

## 2022-01-20 MED ORDER — RANOLAZINE ER 1000 MG PO TB12
1000.0000 mg | ORAL_TABLET | Freq: Two times a day (BID) | ORAL | Status: DC
Start: 2022-01-20 — End: 2022-01-21
  Administered 2022-01-20 – 2022-01-21 (×2): 1000 mg via ORAL
  Filled 2022-01-20 (×7): qty 1

## 2022-01-20 MED ORDER — POTASSIUM & SODIUM PHOSPHATES 280-160-250 MG PO PACK
2.0000 | PACK | ORAL | Status: DC | PRN
Start: 2022-01-20 — End: 2022-01-21

## 2022-01-20 MED ORDER — METOPROLOL SUCCINATE ER 50 MG PO TB24
25.0000 mg | ORAL_TABLET | Freq: Every morning | ORAL | Status: DC
Start: 2022-01-20 — End: 2022-01-21
  Administered 2022-01-21: 25 mg via ORAL
  Filled 2022-01-20 (×2): qty 1

## 2022-01-20 MED ORDER — ISOSORBIDE MONONITRATE ER 60 MG PO TB24
90.0000 mg | ORAL_TABLET | Freq: Every day | ORAL | Status: DC
Start: 2022-01-20 — End: 2022-01-21
  Administered 2022-01-21: 90 mg via ORAL
  Filled 2022-01-20: qty 1

## 2022-01-20 MED ORDER — ASPIRIN 81 MG PO CHEW
81.0000 mg | CHEWABLE_TABLET | Freq: Every morning | ORAL | Status: DC
Start: 2022-01-20 — End: 2022-01-21
  Administered 2022-01-21: 81 mg via ORAL
  Filled 2022-01-20 (×2): qty 1

## 2022-01-20 MED ORDER — GLUCOSE 40 % PO GEL (WRAP)
15.0000 g | ORAL | Status: DC | PRN
Start: 2022-01-20 — End: 2022-01-21

## 2022-01-20 NOTE — Plan of Care (Signed)
Problem: Inadequate Tissue Perfusion  Goal: Adequate tissue perfusion will be maintained  Outcome: Progressing     Problem: Chest Pain  Goal: Vital signs and cardiac rhythm stable  Outcome: Progressing  Goal: Cardiac pain management  Outcome: Progressing  Goal: Patient/Patient Care Companion demonstrates understanding of disease process, treatment plan, medications, and discharge plan  Outcome: Progressing

## 2022-01-20 NOTE — ED Triage Notes (Signed)
SUBJECTIVE: Pt reports chest pain that radiates to his left arm with tingling fingers that started around 0500 this am. 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 Pt states that these symptoms feel similar to his other M.Is. Denies shortness of breath that is out of the norm for him, headache, blurry vision. Endorses dizziness, chest pain, tingling fingers on the left hand, and left arm tingling/pain. Reports taking one nitro pill this morning at 0725 this morning, denies chest pain relief but states that he had a headache.    OBJECTIVE: aaox4, mm moist and pink, skin warm and dry, resps even and unlabored.

## 2022-01-20 NOTE — Consults (Addendum)
HEART  INTERVENTIONAL CARDIOLOGY  CONSULTATION REPORT  [facility]    Date Time: 01/20/22 9:34 AM  Patient Name: Erik [name]  Requesting Physician: Samuel Bouche, MD       Reason for Consultation:     Chest pain  Known CAD    History:   Janelle Culton is a 46 y.o. male admitted on 01/20/2022.  We have been asked by Samuel Bouche, MD,  to provide interventional cardiology subspecialty consultation, regarding chest pain    46 yo M with recurrent admissions for non-typical chest discomfort (diffuse left-sided squeezing lasting hours to days at a time and then remitting spontaneously and with no relief with either escalation of daily anti-anginal therapy for the past few months or any relief with rescue SLNTG) with no acute ischemic ECG changes and no objective evidence of ischemia by cardiac biomarkers concerning for non-cardiac/non-anginal etiology. Background medical hx notable for 6-12 beers per day, 1-2 ppd Tobacco, HTN, HLD, TBI, normal LV systolic function, and known stable CAD (RCA CTO and borderline-significant proximal-mid LAD stenosis).    ER evaluation thus far today notable for hypoNa+ and otherwise normal VS and Labs.    Recent Stress Test, Echo, Cath all reviewed by me.      Past Medical History:     Past Medical History:   Diagnosis Date    Chest pain 12/10/2021    COVID 09/2021    11/2021: asymptomatic    Hearing loss in Left ear     History of echocardiogram 10/05/2021    EF 54    History of nuclear stress test: exercise SPECT abn 11/22/2021    Hyperlipidemia     on Rx    TBI (traumatic brain injury) 1999       Past Surgical History:     Past Surgical History:   Procedure Laterality Date    DENTAL SURGERY      LHC W/ CORONARY ANGIOS AND LV Left 12/12/2021    Procedure: LHC w/ Coronary Angios and LV;  Surgeon: Cilia, Early Chars, MD;  Location: [facility];  Service: Cardiovascular;  Laterality: Left;    LHC W/ CORONARY ANGIOS AND LV Left 01/08/2022    Procedure:  LHC w/ Coronary Angios and LV;  Surgeon: Tawanna Sat, MD;  Location: [facility];  Service: Cardiovascular;  Laterality: Left;  dFR LAD       Family History:     Family History   Problem Relation Age of Onset    Hypothyroidism Mother     Heart attack Father     Hyperlipidemia Sister     Hypertension Sister     Diabetes Brother     COPD Brother     Heart attack Maternal Grandfather        Social History:     Social History     Socioeconomic History    Marital status: Widowed     Spouse name: Not on file    Number of children: Not on file    Years of education: Not on file    Highest education level: Not on file   Occupational History    Not on file   Tobacco Use    Smoking status: Every Day     Packs/day: 1.00     Years: 40.00     Pack years: 40.00     Types: Cigarettes    Smokeless tobacco: Never   Vaping Use    Vaping status: Never Used  Substance and Sexual Activity    Alcohol use: Yes     Alcohol/week: 66.0 standard drinks of alcohol     Types: 66 Cans of beer per week     Comment: drinks 6-12 beers per day    Drug use: Never    Sexual activity: Not on file   Other Topics Concern    Not on file   Social History Narrative    Not on file     Social Determinants of Health     Financial Resource Strain: Not on file   Food Insecurity: Not on file   Transportation Needs: Not on file   Physical Activity: Not on file   Stress: Not on file   Social Connections: Not on file   Intimate Partner Violence: Not on file   Housing Stability: Not on file       Allergies:   No Known Allergies    Medications:   (Not in a hospital admission)           Review of Systems:    Comprehensive review of systems including constitutional, eyes, ears, nose, mouth, throat, cardiovascular, GI, GU, musculoskeletal, integumentary, respiratory, neurologic, psychiatric, and endocrine is negative other than what is mentioned already in the history of present illness    Physical Exam:     VITAL SIGNS PHYSICAL EXAM   Vitals:    01/20/22  0836   BP: (!) 152/100   Pulse: 75   Resp: 20   Temp: 98.1 F (36.7 C)   SpO2: 97%     Temp (24hrs), Avg:98.1 F (36.7 C), Min:98.1 F (36.7 C), Max:98.1 F (36.7 C)      Intake and Output Summary (Last 24 hours) at Date Time  No intake or output data in the 24 hours ending 01/20/22 0934    Telemetry: no changes Physical Exam  General: awake, alert, breathing comfortably, no acute distress  Head: normocephalic  Eyes: EOM's intact  Cardiovascular: regular rate and rhythm, normal S1, S2, no S3, no S4, no murmurs, rubs or gallops  Neck: no carotid bruits or JVD  Lungs: clear to auscultation bilateraly, without wheezing, rhonchi, or rales  Abdomen: soft, non-tender, non-distended; no palpable masses,  normoactive bowel sounds  Extremities: no edema  Pulse: equal pulses, 4/4 symmetric  Neurological: Alert and oriented X3, mood and affect normal  Musculoskeletal: normal strength and tone     Labs Reviewed:     Recent Labs   Lab 01/20/22  0841 01/17/22  1412   hs Troponin-I <2.7 3.9             Recent Labs   Lab 01/20/22  0841   Bilirubin, Total 0.5   Protein, Total 7.0   Albumin 4.2   ALT 24   AST (SGOT) 19         Recent Labs   Lab 01/17/22  1530   PT 10.6   PT INR 0.9   PTT 28     Recent Labs   Lab 01/20/22  0841 01/17/22  1412   WBC 8.62 7.94   Hgb 14.2 14.2   Hematocrit 39.0 38.6   Platelets 223 208     Recent Labs   Lab 01/20/22  0841 01/17/22  1412   Sodium 128* 127*   Potassium 4.5 4.1   Chloride 94* 96*   CO2 25 21   BUN 6.0* 8.0*   Creatinine 0.9 0.9   EGFR >60.0 >60.0   Glucose 97 216*   Calcium  9.1 8.8       DIAGNOSTIC    EKG: Reviewed    Tele: Reviewed    chest X-ray: Reviewed    Cath: Reviewed    MPI: Reviewed    Echo: Reviewed    Assessment:     Admit with recurrent diffuse left-sided squeezing chest-discomfort with left arm numbness and tingling: lasting hours to days at a time and then remitting spontaneously and with no relief with either escalation of daily anti-anginal therapy for the past few  months or any relief with rescue SLNTG (suggestive of non-cardiac/non-coronary etiology for sx)  Recurrent admissions for non-typical chest discomfort: with no acute ischemic ECG changes and no objective evidence of ischemia by cardiac biomarkers  Known CAD most notable for (RCA CTO and borderline-significant proximal-mid LAD stenosis [DFR 0.89]) by repeated coronary angiography 2023.  EF 50-55% by 2022 Echo  6-12 beers per day  1-2 ppd Tobacco  HTN  HLD  TBI  Hyponatremia    Recommendations:     Overall presentation is not consistent with ACS or Angina  Recommend continue outpatient ASA, Statin, B-Blocker  Defer management of Hyponatremia to Medicine Service  Recommend CIWA protocol: and consultation this admission re: substance abuse therapies  Recommend trial of PPI for possible concomitant GERD  Recommend EF reassessment by repeat Limited Echo  He was scheduled for outpatient CTO PCI later this week: but all of the above issues and his likely non-anginal sx and recurrent ER presentations (for sx that can not be adequately explained by his coronary anatomy) and multivessel CAD very much call this plan into question: and advocate instead in the short-term for EtOH cessation, management of HypoNa+ (likely EtOH-related), Tobacco cessation, and ongoing medical therapy of stable CAD, with future outpatient consideration of either CABG (much more preferred in setting of multivessel CAD) or complex multivessel CTO and non-CTO PCI (much less preferred) if symptoms persist after resolution of other more-pressing medical issues and non-cardiac comorbidities      Signed by: Burna Forts, MD     Heart  APP Spectralink [phone] (8am-5pm)  MD Spectralink [phone] or [phone] (8am-5pm)  Arrhythmia Spectralink [phone] (8am-4:30pm)  Arrhythmia urgent consults or to reach the on-call EP MD [phone]  After hours, non urgent consult line [phone]  After Hours, urgent consults or to reach the on-call MD [phone]

## 2022-01-20 NOTE — ED Notes (Signed)
[facility] EMERGENCY DEPT  ED NURSING NOTE FOR THE RECEIVING INPATIENT NURSE   ED NURSE Maddie/Linda   Philis Kendall [id number]   ED CHARGE RN Marisue Ivan, RN   ADMISSION INFORMATION   Randie Tallarico Ben is a 46 y.o. male admitted with an ED diagnosis of:    1. Chest pain, unspecified type         Isolation: None   Allergies: Patient has no known allergies.   Holding Orders confirmed? No   Belongings Documented? No   Home medications sent to pharmacy confirmed? N/A   NURSING CARE   Patient Comes From:   Mental Status: Home Independent  alert and oriented   ADL: Independent with all ADLs   Ambulation: no difficulty   Pertinent Information  and Safety Concerns:     Broset Violence Risk Level: Low SUBJECTIVE: Pt reports chest pain that radiates to his left arm with tingling fingers that started around 0500 this am. 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 Pt states that these symptoms feel similar to his other M.Is. Denies shortness of breath that is out of the norm for him, headache, blurry vision. Endorses dizziness, chest pain, tingling fingers on the left hand, and left arm tingling/pain. Reports taking one nitro pill this morning at 0725 this morning, denies chest pain relief but states that he had a headache.    OBJECTIVE: aaox4, mm moist and pink, skin warm and dry, resps even and unlabored.      CT / NIH   CT Head ordered on this patient?  No   NIH/Dysphagia assessment done prior to admission? No   VITAL SIGNS (at the time of this note)      Vitals:    01/20/22 1000   BP: 131/84   Pulse: 70   Resp: 13   Temp:    SpO2: 97%

## 2022-01-20 NOTE — H&P (Signed)
Chief Complaint:  chest pain    HPI  Chart reviewed, including H&P dated 01/08/22, amended as below    [name] is a 46 year old male with a history of coronary artery disease on a background of significant family history of CAD, who was managed here at the end of march 2023 for chest pain, and was medically managed with plans for staged PCI to begin 4/13.  He was discharged on 01/09/22 and presents now with recurrent chest pain that occurred last night around 4am, pressure like and radiated to his left arm.  He took nitro with partial relief but did not want to keep taking more so he came to the hospital.  He confirms he continues to smoke, reduced down now to 1.5 packs every other day, and he has a history of heavy alcohol use spanning years, without complex withdrawal.  Baseline is about 24 beers daily until around feb-march this year, now reduced to 6-12 beers daily.  He eats mostly prepackaged foods, two meals per day even when he drinks alcohol.  No dyspnea      Past medical history   Reviewed, no significant changes    Medications   Aspirin 81mg   Lipitor 40mg   Imdur 90mg  daily  Toprol 25mg   Nitro PRN  Ranexa 1gram BID    Allergies:   No Known Allergies    Family history   Reviewed, no significant changes    Social history   Reviewed, no significant changes unless noted in HPI    Review of systems:  All other systems were reviewed and are negative except as previously noted in the HPI    Vitals:    01/20/22 0830 01/20/22 0836 01/20/22 1000   BP:  (!) 152/100 131/84   Pulse: 81 75 70   Resp:  20 13   Temp:  98.1 F (36.7 C)    TempSrc:  Temporal    SpO2: 99% 97% 97%   Weight:  79.4 kg (175 lb)    Height:  1.88 m (6\' 2" )        Exam  Gen: No acute distress, well developed  Eyes:  PERRL, EOMI   Oral cavity:  clear without ulcerations or significant lesions, moist mucous membranes  Neck: Supple, no masses  CV: RRR, no m,r,g, extremities warm and well perfused, no significant lower extremity edema  Lungs: CTA B  without wheezing or rhonchi  Abd: Soft, NTND without rebound; no masses  Skin:  No significant visible rashes  Neuro: upper and lower extremity strength grossly intact  Psych: Clear speech, goal oriented thought process; appropriate thought content    Labwork  Labwork personally reviewed.    Imaging/EKG  Imaging/EKG personally reviewed.    Synthesis/Assessment   Overall a 46 year old male with a history of coronary artery disease on a background of significant family history of CAD, active smoking, heavy alcohol use and chronic hyponatremia, who was to have a PCI 4/13, presenting now with chest pain.  He has been evaluated by cardiology and its not clear that his pain is cardiac in nature.  I did review his pressure log that he has kept the last two weeks and see he has a relative hypertension with diastolics to 100-110 frequently, and this may be a picture of HTN urgency with his poor cardiac reserve/CAD.  Trops are negative.  Am less concerned with his sodium per se, and he is working already on reducing smoking and alcohol consumption.  1)Chest pain  -cycle cardiac enzymes  -short term telemetry  -can trial GI cocktail and BID pepcid    2)HTN urgency: may need medication adjustments  -resume Imdur 90  -resume Toprol 25  -perhaps can add ACE-I or nifedipine XL pending reassessment    3)Hyponatremia, chronic:  SIADH picture perhaps. Seems to have reasonable solute intake despite drinking  -check urine osms and sodium (is not on diuretics at home)  -defer IV fluids and fluid restriction and lets see where sodium lands tomorrow    4)Heavy alcohol use without complex withdrawal:  Low PAWWS  -CIWA reasonable, defer benzos pending clinical reassessment    5)History of tobacco use:  counseled on cessation, can have a patch if he likes    6)Coronary artery disease with plans for PCI:  -ongoing cardiology evaluation  -resume antihypertensives as above  -resume statin and ranexa    Chemical DVT ppx  FULL code  Telemetry:   short term ordered for high risk chest pain, auto-expires 4/11 at noon    Ignacia Palma, MD FACP [facility] MBA  Director of Education, Medicine Service Line  Associate Medical Director Hospitalist Medicine  [facility]

## 2022-01-20 NOTE — Telephone Encounter (Signed)
Received a call from schedulers reporting patient called and complained of CP w/ elevated BP. Call got disconnected though.     Called patient. Patient reports waking up at 4 AM this morning. 7 out of 10 chest pain started at 5 AM and began to radiate to his left arm and fingers. The chest discomfort feels like the pain he normally has, but radiating to left arm is new. Took all AM medications. Patient reports taking nitro at 7:25 AM, w/ no symptom relief. BP 149/96 and HR 78. Patient was in the ED for chest pain on Friday, but discharged home.     Spoke with Dr. Elder Love regarding patient's case and symptoms. Dr. Elder Love recommended patient reports to the ED d/t progression of symptoms w/ left arm discomfort and no symptom relief from AM meds or nitro. Relayed recommendation to patient. Patient verbalized understanding and confirmed he will go to the ED asap.

## 2022-01-20 NOTE — ED Provider Notes (Signed)
[facility] [facility] EMERGENCY DEPARTMENT H&P         CLINICAL INFORMATION        HPI:      Chief Complaint: Chest Pain  .    Erik [name] is a 46 y.o. male with PMHx of MI, TBI, HLD, coronary disease with RCA CTO, hypertension and dyslipidemia, cardiac catheterization in December 30, 2021 by Dr. Had that showed 40% ostial stenosis in the LAD, 40 to 50% mid LAD which was positive by FFR and 70% OM1 stenosis, known CTO of the RCA, who presents with acute onset of moderate constant sharp left sided cp with radiation down the left arm since this AM. Pt reports that he took 1 Nitro pta with no relief. He states that he is scheduled for a cardiac cath in 3 days. No fever, cough, phlegm, leg swelling, and abd pain.     History obtained from: patient, family, review of prior chart          ROS:      Positive and negative ROS elements as per HPI.  All other systems reviewed and negative.      Physical Exam:      Pulse 81  BP (!) 152/100  Resp 20  SpO2 99 %  Temp 98.1 F (36.7 C)    Physical Exam  Vitals and nursing note reviewed.   Constitutional:       Appearance: Normal appearance. He is well-developed.   HENT:      Head: Normocephalic and atraumatic.   Eyes:      Extraocular Movements: Extraocular movements intact.      Conjunctiva/sclera: Conjunctivae normal.      Pupils: Pupils are equal, round, and reactive to light.   Cardiovascular:      Rate and Rhythm: Normal rate and regular rhythm.      Heart sounds: Normal heart sounds.   Pulmonary:      Effort: Pulmonary effort is normal.      Breath sounds: Normal breath sounds.   Abdominal:      General: Bowel sounds are normal.      Palpations: Abdomen is soft.      Tenderness: There is no abdominal tenderness.   Musculoskeletal:         General: Normal range of motion.      Cervical back: Normal range of motion and neck supple.      Right lower leg: No edema.      Left lower leg: No edema.   Skin:     General: Skin is warm and dry.    Neurological:      General: No focal deficit present.      Mental Status: He is alert and oriented to person, place, and time.      Deep Tendon Reflexes: Reflexes are normal and symmetric.   Psychiatric:         Mood and Affect: Mood normal.         Thought Content: Thought content normal.                  PAST HISTORY        Primary Care Provider: Cilia, Early Chars, MD        PMH/PSH:    .     Past Medical History:   Diagnosis Date    Chest pain 12/10/2021    COVID 09/2021    11/2021: asymptomatic    Hearing loss in Left ear     History of  echocardiogram 10/05/2021    EF 54    History of nuclear stress test: exercise SPECT abn 11/22/2021    Hyperlipidemia     on Rx    TBI (traumatic brain injury) 1999       He has a past surgical history that includes Dental surgery; LHC w/ Coronary Angios and LV (Left, 12/12/2021); and LHC w/ Coronary Angios and LV (Left, 01/08/2022).      Social/Family History:      He reports that he has been smoking cigarettes. He has a 40.00 pack-year smoking history. He has never used smokeless tobacco. He reports current alcohol use of about 66.0 standard drinks of alcohol per week. He reports that he does not use drugs.    Family History   Problem Relation Age of Onset    Hypothyroidism Mother     Heart attack Father     Hyperlipidemia Sister     Hypertension Sister     Diabetes Brother     COPD Brother     Heart attack Maternal Grandfather          Listed Medications on Arrival:    .     Home Medications       Med List Status: Complete Set By: Brennan Bailey, RN at 01/21/2022  6:57 AM              aspirin 81 MG chewable tablet     Chew 1 tablet (81 mg) by mouth daily     Patient taking differently: Chew 81 mg by mouth every morning     atorvastatin (LIPITOR) 40 MG tablet     Take 1 tablet (40 mg) by mouth nightly     metoprolol succinate XL (TOPROL-XL) 25 MG 24 hr tablet     Take 1 tablet (25 mg) by mouth daily     Patient taking differently: Take 25 mg by mouth every morning      nitroglycerin (NITROSTAT) 0.4 MG SL tablet     Place 1 tablet (0.4 mg) under the tongue every 5 (five) minutes as needed for Chest pain May repeat up to 3x.  CALL 911 FOR UNRELIEVED CHEST PAIN     ranolazine (RANEXA) 500 MG 12 hr tablet     Take 2 tablets (1,000 mg) by mouth 2 (two) times daily             Patient taking differently:         Allergies: He has No Known Allergies.            VISIT INFORMATION        Clinical Course in the ED:             Medications Given in the ED:    .     ED Medication Orders (From admission, onward)      Start Ordered     Status Ordering Provider    01/20/22 2200 01/20/22 1105    At bedtime        Route: Oral  Ordered Dose: 40 mg     Discontinued Ignacia Palma    01/20/22 1230 01/20/22 1105    Every 12 hours scheduled        Route: Oral  Ordered Dose: 1,000 mg     Discontinued Ignacia Palma    01/20/22 1200 01/20/22 1058    Daily        Route: Subcutaneous  Ordered Dose: 40 mg     Discontinued GARCIA, IVAN F  01/20/22 1200 01/20/22 1105    Every morning        Route: Oral  Ordered Dose: 81 mg     Discontinued Ignacia Palma    01/20/22 1200 01/20/22 1105    Daily        Route: Oral  Ordered Dose: 90 mg     Discontinued GARCIA, IVAN F    01/20/22 1200 01/20/22 1105    Every morning        Route: Oral  Ordered Dose: 25 mg     Discontinued Ignacia Palma    01/20/22 1105 01/20/22 1105    Every 5 min PRN        Route: Sublingual  Ordered Dose: 0.4 mg     Discontinued Ignacia Palma    01/20/22 1057 01/20/22 1058    Every 6 hours PRN        Route: Oral  Ordered Dose: 650 mg    See Hyperspace for full Linked Orders Report.    Discontinued Galen Daft F    01/20/22 1057 01/20/22 1058    Every 6 hours PRN        Route: Rectal  Ordered Dose: 650 mg    See Hyperspace for full Linked Orders Report.    Discontinued Galen Daft F    01/20/22 1057 01/20/22 1058    Every 6 hours PRN        Route: Oral  Ordered Dose: 4 mg    See Hyperspace for full Linked Orders Report.    Discontinued  Galen Daft F    01/20/22 1057 01/20/22 1058    Every 6 hours PRN        Route: Intravenous  Ordered Dose: 4 mg    See Hyperspace for full Linked Orders Report.    Discontinued Galen Daft F    01/20/22 1057 01/20/22 1058    As needed        Route: Oral  Ordered Dose: 15 g of glucose    See Hyperspace for full Linked Orders Report.    Discontinued Galen Daft F    01/20/22 1057 01/20/22 1058    As needed        Route: Intravenous  Ordered Dose: 12.5 g    See Hyperspace for full Linked Orders Report.    Discontinued Galen Daft F    01/20/22 1057 01/20/22 1058    As needed        Route: Intravenous  Ordered Dose: 12.5 g    See Hyperspace for full Linked Orders Report.    Discontinued Galen Daft F    01/20/22 1057 01/20/22 1058    As needed        Route: Intramuscular  Ordered Dose: 1 mg    See Hyperspace for full Linked Orders Report.    Discontinued Galen Daft F    01/20/22 1057 01/20/22 1058    At bedtime PRN        Route: Oral  Ordered Dose: 3 mg     Discontinued Ignacia Palma    01/20/22 1057 01/20/22 1058    As needed        Route: Intravenous  Ordered Dose: 1 g     Discontinued Galen Daft F    01/20/22 1057 01/20/22 1058    As needed        Route: Oral  Ordered Dose: 0-40 mEq    See Hyperspace for full Linked Orders Report.    Discontinued GARCIA, IVAN  F    01/20/22 1057 01/20/22 1058    As needed        Route: Intravenous  Ordered Dose: 10 mEq    See Hyperspace for full Linked Orders Report.    Discontinued Galen Daft F    01/20/22 1057 01/20/22 1058    As needed        Route: Oral  Ordered Dose: 2 packet     Discontinued GARCIA, IVAN F    01/20/22 1057 01/20/22 1058    As needed        Route: Intravenous  Ordered Dose: 0.2 mg     Discontinued GARCIA, IVAN F              Procedures:      Procedures      Interpretations:      EKG: I reviewed and Independently interpreted the patient's EKG as normal sinus at 72. ST depression inferiorly. No change from prior EKG. Abnormal EKG.                RESULTS         Lab Results:      Results       Procedure Component Value Units Date/Time    GFR [[id number]] Collected: 01/21/22 0342     Updated: 01/21/22 0436     EGFR >60.0       Basic Metabolic Panel [[id number]]  (Abnormal) Collected: 01/21/22 0342    Specimen: Blood Updated: 01/21/22 0436     Glucose 92 mg/dL      BUN 78.2 mg/dL      Creatinine 1.1 mg/dL      Calcium 9.1 mg/dL      Sodium 956 mEq/L      Potassium 4.5 mEq/L      Chloride 100 mEq/L      CO2 23 mEq/L      Anion Gap 7.0    Urine Osmolality [[id number]]  (Abnormal) Collected: 01/20/22 1431    Specimen: Urine Updated: 01/20/22 2038     Urine Osmolality 160 mosm/kg     Urine Sodium Random [[id number]] Collected: 01/20/22 1431    Specimen: Urine Updated: 01/20/22 2014     Urine Sodium Random 48 mEq/L     Urinalysis with microscopic [[id number]] Collected: 01/20/22 1431    Specimen: Urine Updated: 01/20/22 1534     Urine Type Clean Catch     Color, UA Colorless     Clarity, UA Clear     Specific Gravity UA 1.007     Urine pH 7.0     Leukocyte Esterase, UA Negative     Nitrite, UA Negative     Protein, UR Negative     Glucose, UA Negative     Ketones UA Negative     Urobilinogen, UA Normal mg/dL      Bilirubin, UA Negative     Blood, UA Negative     RBC, UA 0-2 /hpf      WBC, UA 0-5 /hpf      Squamous Epithelial Cells, Urine 0-5 /hpf     High Sensitivity Troponin-I [[id number]] Collected: 01/20/22 1453    Specimen: Blood Updated: 01/20/22 1528     hs Troponin-I <2.7 ng/L                 Radiology Results:      Chest AP Portable   Final Result      No acute cardiopulmonary process.      Legrand Pitts, MD  01/20/2022 9:04 AM                 Visit date: 01/20/2022      CLINICAL SUMMARY          Diagnosis:    .     Final diagnoses:   Chest pain, unspecified type         MDM Notes:    Medical Decision Making  Amount and/or Complexity of Data Reviewed  Labs: ordered.  Radiology: ordered.         I am the first provider for this patient.  I reviewed the vital signs, nursing  notes, past medical history, past surgical history, family history and social history.  I have reviewed the patient's previous charts.    Cardiology consulted in the ER         Disposition:         Observation Admit      ED Disposition       ED Disposition   Observation    Condition   --    Date/Time   Mon Jan 20, 2022 10:39 AM    Comment   Admitting Physician: Ignacia Palma [[id number]]   Service:: Medicine [[id number]]   Estimated Length of Stay: < 2 midnights   Tentative Discharge Plan?: Home or Self Care [1]   Does patient need telemetry?: Yes   Is patient 18 yrs or greater?: Yes   Telemetry type (separate Telemetry order is also required):: Adult telemetry                         Scribe Attestation:      I was acting as a Neurosurgeon for [employer], MD on [facility] RICHARD   Treatment Team: Scribe: Meta Hatchet     I am the first provider for this patient and I personally performed the services documented. Treatment Team: Scribe: Meta Hatchet is scribing for me on [facility] .This note and the patient instructions accurately reflect work and decisions made by me.  Samuel Bouche, MD                    Samuel Bouche, MD  01/21/22 [phone]

## 2022-01-21 ENCOUNTER — Telehealth (INDEPENDENT_AMBULATORY_CARE_PROVIDER_SITE_OTHER): Payer: Self-pay | Admitting: Thoracic Surgery (Cardiothoracic Vascular Surgery)

## 2022-01-21 DIAGNOSIS — Z01818 Encounter for other preprocedural examination: Secondary | ICD-10-CM

## 2022-01-21 DIAGNOSIS — I25112 Atherosclerosic heart disease of native coronary artery with refractory angina pectoris: Secondary | ICD-10-CM

## 2022-01-21 DIAGNOSIS — I10 Essential (primary) hypertension: Secondary | ICD-10-CM

## 2022-01-21 DIAGNOSIS — I2510 Atherosclerotic heart disease of native coronary artery without angina pectoris: Secondary | ICD-10-CM

## 2022-01-21 DIAGNOSIS — I160 Hypertensive urgency: Secondary | ICD-10-CM

## 2022-01-21 DIAGNOSIS — R079 Chest pain, unspecified: Secondary | ICD-10-CM

## 2022-01-21 DIAGNOSIS — E785 Hyperlipidemia, unspecified: Secondary | ICD-10-CM

## 2022-01-21 DIAGNOSIS — I2582 Chronic total occlusion of coronary artery: Secondary | ICD-10-CM

## 2022-01-21 LAB — BASIC METABOLIC PANEL
Anion Gap: 7 (ref 5.0–15.0)
BUN: 10 mg/dL (ref 9.0–28.0)
CO2: 23 mEq/L (ref 17–29)
Calcium: 9.1 mg/dL (ref 8.5–10.5)
Chloride: 100 mEq/L (ref 99–111)
Creatinine: 1.1 mg/dL (ref 0.5–1.5)
Glucose: 92 mg/dL (ref 70–100)
Potassium: 4.5 mEq/L (ref 3.5–5.3)
Sodium: 130 mEq/L — ABNORMAL LOW (ref 135–145)

## 2022-01-21 LAB — GFR: EGFR: >60

## 2022-01-21 MED ORDER — ISOSORBIDE MONONITRATE ER 30 MG PO TB24
90.0000 mg | ORAL_TABLET | Freq: Every day | ORAL | Status: DC
Start: 2022-01-21 — End: 2022-01-30

## 2022-01-21 MED ORDER — SERTRALINE HCL 50 MG PO TABS
50.0000 mg | ORAL_TABLET | Freq: Every day | ORAL | 0 refills | Status: DC
Start: 2022-01-21 — End: 2022-02-24

## 2022-01-21 MED ORDER — FAMOTIDINE 20 MG PO TABS
20.0000 mg | ORAL_TABLET | Freq: Two times a day (BID) | ORAL | 0 refills | Status: AC
Start: 2022-01-21 — End: ?

## 2022-01-21 NOTE — Discharge Instr - AVS First Page (Addendum)
Reason for your Hospital Admission:  You came to the hospital for chest pain. You were seen by cardiology who recommended outpatient follow up and evaluation for heart surgery.    Instructions for after your discharge:  Cardiology suggested an antidepressent medication; sertraline was sent to your pharmacy if you want to try this medication for mood  Please follow up with your PCP for further treatment of depression  Please follow up with cardiology to discuss next steps for cardiac workup.

## 2022-01-21 NOTE — Discharge Summary -  Nursing (Signed)
Pt A&Ox4, orders written for d/c to home.  Pt given d/c instructions,  prescriptions, medication reconciliation, pt teaching done on new medications, follow up instructions, and instructions on when to call the doctor.  Pt verbalized understanding. Pt stated understanding of all questions and has no further questions at this time.  Pt's IV was d/c'd, removed and skin is  intact.  Pt will be transported off unit via wheelchair operated by hospital transporter in private vehicle.

## 2022-01-21 NOTE — Plan of Care (Signed)
Pt A/Ox4, VSS, pt negative CIWA so far this shift, and resting comfortably. Pt stable on feet, pt denies dizziness, chest pain, SOB this shift and no seizure activity.    Problem: Inadequate Tissue Perfusion  Goal: Adequate tissue perfusion will be maintained  Flowsheets (Taken 01/21/2022 0236)  Adequate tissue perfusion will be maintained:   Monitor/assess vital signs   Monitor intake and output   Monitor/assess for signs of VTE (edema of calf/thigh redness, pain)   Monitor/assess lab values and report abnormal values   Encourage/assist patient as needed to turn, cough, and perform deep breathing every 2 hours   Assess and monitor skin integrity     Problem: Chest Pain  Goal: Vital signs and cardiac rhythm stable  Flowsheets (Taken 01/21/2022 0236)  Vital signs and cardiac rhythm stable:   Monitor /assess vital signs/cardiac rhythms   Monitor labs   Assess the need for oxygen therapy and administer as ordered  Goal: Cardiac pain management  Flowsheets (Taken 01/21/2022 0236)  Cardiac pain management: Assess pain/or related discomfort on admission, during daily assessment, before and after any intervention  Goal: Patient/Patient Care Companion demonstrates understanding of disease process, treatment plan, medications, and discharge plan  Flowsheets (Taken 01/21/2022 0248)  Patient/Patient Care Companion demonstrates understanding of disease process, treatment plan, medications and discharge plan:   Educate patient to immediately report any chest pain/equivalent to RN   Assess need for smoking cessation and substance abuse counseling and refer as needed   Educate patient/patient care companion regarding identified learning needs   Assist patient/patient care companion to identify measures for cardiac risk factor management     Problem: Moderate/High Fall Risk Score >5  Goal: Patient will remain free of falls  Flowsheets (Taken 01/21/2022 0100 by Norville Haggard, RN)  High (Greater than 13):   HIGH-Visual cue at entrance to  patient's room   HIGH-Bed alarm on at all times while patient in bed   HIGH-Utilize chair pad alarm for patient while in the chair   HIGH-Pharmacy to initiate evaluation and intervention per protocol   HIGH-Consider use of low bed   HIGH-Initiate use of floor mats as appropriate   HIGH-Apply yellow "Fall Risk" arm band

## 2022-01-21 NOTE — Plan of Care (Signed)
Problem: Inadequate Tissue Perfusion  Goal: Adequate tissue perfusion will be maintained  Outcome: Progressing     Problem: Chest Pain  Goal: Vital signs and cardiac rhythm stable  Outcome: Progressing  Goal: Cardiac pain management  Outcome: Progressing  Goal: Patient/Patient Care Companion demonstrates understanding of disease process, treatment plan, medications, and discharge plan  Outcome: Progressing

## 2022-01-21 NOTE — Telephone Encounter (Signed)
Dr. Austin Miles  Surgery - February 05, 2022  CABG  Same Day Admission   Patient met Thedore Mins after cath while inhouse on 4/11

## 2022-01-21 NOTE — Progress Notes (Signed)
Interventional Cardiology    46 yo M HTN, HLD, TBI, EtOH, TBI, borderline-normal LVSF/no significant valve dz, multivessel CAD (with anatomy best-suited to CABG), with recurrent mixed anginal/non-anginal sx, admit with recurrent SSCP with no objective evidence of ischemia/ACS by ECG, Telemetry, or Cardiac Biomarkers. Underwent multi-disciplinary Heart Team (Patient, CT Surgery/Dr. Thedore Mins, Interventional Cardiology/Drs Kevyn Boquet and Cilia) assessment: with plan for outpatient CABG.    Recommendations:    -  Discharge home this AM (#1 patient has Dog in [facility] Room he needs to let out and #2 he has no current inpatient medical needs)  -  Continue ASA, high-dose Statin therapy, Imdur, Beta-Blocker  -  Continue Pepcid for likely concomitant GERD  -  Will follow-up as outpatient with CT Surgery for CABG scheduling (defer to CT Surgery re: date/time)  -  Recommend consideration of antidepressant medication  -  Recommend providing patient resources re: EtOH/Tobacco cessation    Tresea Mall, MD  Interventional Cardiology  [facility]

## 2022-01-21 NOTE — Discharge Instructions (Signed)
Surgical Date    Wednesday February 05, 2022             Carotid Doppler        Vein Mapping        Radial Artery Ultrasound    Friday April 14th @ 1:00  Surgeon's office  [facility]  [address]  8th floor; [address]  [place], [place] [address]  [phone]         EKG;           (1 tech)   ______________________________     Loney Laurence     (2nd Tech)  Urinalysis; Urine Culture;   MSSA + MRSA swabs (Nares AND Throat);  Type & screen  Cold screen;   APTT ;   PT / INR  A1C;   CBC w/differencial;   CMP  --- Fasting not required for this bloodwork    Tuesday April 18th @ 8:30     [facility]  [address]  7th  floor; [address]  [place], [place] [address]            History & Physical / Education              with Dr. Doristine Church APP -        (Advanced Practice Provider)             Tuesday April 18th @ 9:00  Surgeon's office  [facility]  [address]  8th floor; [address]  [place], [place] [address]  [phone]         Chest CXR / X-ray       Tuesday April 18th -   after above testing is completed   -- walk in  [facility]  [address]  [address]  [place], [place] [address]      SEE                           2nd                      PAGE         Phone Interview          Tuesday April 25th @ 8:00  Nurse from the hospital will call you      If you develop any of the follow symptoms,  please contact our office IMMEDIATELY  Cough  Shortness of Breath  Runny Nose  Fever  Congestion     You MAY BE required to have   COVID Testing prior to surgery  (PCR Testing or Rapid)              [facility] your [facility] for                         an appointment if instructed                   to by clinic / office

## 2022-01-21 NOTE — Discharge Summary (Signed)
MEDICINE DISCHARGE SUMMARY    Date Time: 01/21/22 1:08 PM  Patient Name: Erik [name]  Attending Physician: Catha Nottingham, DO  Primary Care Physician: Art Buff, MD    Date of Admission: 01/20/2022  Date of Discharge: 01/21/2022    Handoff to Outpatient Providers:     Cardiology/CTS to set up outpatient CABG evaluation and procedure.     Reason for Admission   Recurrent chest pain    Hospital Course / Discharge Diagnoses:   Cainen Burnham is a 46 y.o. male with a history of HTN, HLD, TBI, CAD, tobacco use, and recurrent admits for non-typical chest discomfort who presented to [facility] 01/20/2022 with recurrent chest pain     Recurrent Chest Pain  CAD with chronic 3 vessel disease  Hx recurrent admits for the same in setting of CAD.  Recurrent diffuse left-sided squeezing chest discomfort.  Echo 2022: LVEF 50-55%. LHC 01/08/22:  RCA CTO and borderline-significant proximal-mid LAD stenosis. Chest pain resolved on 01/21/22.   - Cardiology recommended outpatient CABG evaluation. Spirometry done inpatient prior to discharge with clear detailed plan discussed with patient and appointments noted on AVS  - Continue aspirin statin beta-blocker.  - PPI  - He will be scheduled for an elective CABG Wednesday, February 05, 2022 with Dr. Thedore Mins.       HTN urgency  Elevated on admit May need medication adjustments outpatient  -Imdur,  Toprol  -perhaps can add ACE-I or nifedipine XL pending reassessment     Chronic Hyponatremia  SIADH picture perhaps. Seems to have reasonable solute intake despite alcohol use  - Admit urine osms and sodium  - monitor labs    Depression  Reported symptoms.   - initiated on sertraline on discharge; follow up with PCP outpatient    Alcohol use disorder  Heavy alcohol use without complex withdrawal:  Low PAWWS  - recommended cessation; CM consulted for resources prior to discharge     Tobacco abuse disorder  - Cessation recommended     Discharge Day Physical Exam:      Patient seen and  examined on the day of discharge. Patient seen, states that he just discussed with cardiology who patient reports is considering inpatient later this week CABG inpatient later this week.  He denies any chest pain shortness breath or lightheadedness.    Temp:  [97.7 F (36.5 C)-98.2 F (36.8 C)] 98.1 F (36.7 C)  Heart Rate:  [59-71] 64  Resp Rate:  [14-18] 17  BP: (112-138)/(72-85) 125/76    General: NAD  Cardiovascular: Regular rhythm, normal rate  Lungs: Clear to auscultation bilaterally  Abdomen: Soft, non-tender  Neuro: Awake and alert. No clear facial asymmetry, moving all extremities  Psych: calm and cooperative      Disposition:      Home.     Recent Labs:     Lab Results last 48 Hours       Procedure Component Value Units Date/Time    GFR [[id number]] Collected: 01/21/22 0342     Updated: 01/21/22 0436     EGFR >60.0       Basic Metabolic Panel [[id number]]  (Abnormal) Collected: 01/21/22 0342    Specimen: Blood Updated: 01/21/22 0436     Glucose 92 mg/dL      BUN 78.2 mg/dL      Creatinine 1.1 mg/dL      Calcium 9.1 mg/dL      Sodium 956 mEq/L      Potassium 4.5 mEq/L  Chloride 100 mEq/L      CO2 23 mEq/L      Anion Gap 7.0    Urine Osmolality [[id number]]  (Abnormal) Collected: 01/20/22 1431    Specimen: Urine Updated: 01/20/22 2038     Urine Osmolality 160 mosm/kg     Urine Sodium Random [[id number]] Collected: 01/20/22 1431    Specimen: Urine Updated: 01/20/22 2014     Urine Sodium Random 48 mEq/L     Urinalysis with microscopic [[id number]] Collected: 01/20/22 1431    Specimen: Urine Updated: 01/20/22 1534     Urine Type Clean Catch     Color, UA Colorless     Clarity, UA Clear     Specific Gravity UA 1.007     Urine pH 7.0     Leukocyte Esterase, UA Negative     Nitrite, UA Negative     Protein, UR Negative     Glucose, UA Negative     Ketones UA Negative     Urobilinogen, UA Normal mg/dL      Bilirubin, UA Negative     Blood, UA Negative     RBC, UA 0-2 /hpf      WBC, UA 0-5 /hpf      Squamous  Epithelial Cells, Urine 0-5 /hpf     High Sensitivity Troponin-I [[id number]] Collected: 01/20/22 1453    Specimen: Blood Updated: 01/20/22 1528     hs Troponin-I <2.7 ng/L     High Sensitivity Troponin-I [[id number]] Collected: 01/20/22 0841    Specimen: Blood Updated: 01/20/22 0916     hs Troponin-I <2.7 ng/L     Comprehensive metabolic panel [[id number]]  (Abnormal) Collected: 01/20/22 0841    Specimen: Blood Updated: 01/20/22 0913     Glucose 97 mg/dL      BUN 6.0 mg/dL      Creatinine 0.9 mg/dL      Sodium 536 mEq/L      Potassium 4.5 mEq/L      Chloride 94 mEq/L      CO2 25 mEq/L      Calcium 9.1 mg/dL      Protein, Total 7.0 g/dL      Albumin 4.2 g/dL      AST (SGOT) 19 U/L      ALT 24 U/L      Alkaline Phosphatase 65 U/L      Bilirubin, Total 0.5 mg/dL      Globulin 2.8 g/dL      Albumin/Globulin Ratio 1.5     Anion Gap 9.0    GFR [[id number]] Collected: 01/20/22 0841     Updated: 01/20/22 0913     EGFR >60.0       CBC and differential [[id number]]  (Abnormal) Collected: 01/20/22 0841    Specimen: Blood Updated: 01/20/22 0857     WBC 8.62 x10 3/uL      Hgb 14.2 g/dL      Hematocrit 43.3 %      Platelets 223 x10 3/uL      RBC 4.42 x10 6/uL      MCV 88.2 fL      MCH 32.1 pg      MCHC 36.4 g/dL      RDW 12 %      MPV 8.2 fL      Instrument Absolute Neutrophil Count 5.88 x10 3/uL      Neutrophils 68.2 %      Lymphocytes Automated 22.3 %      Monocytes 6.3 %      Eosinophils  Automated 2.1 %      Basophils Automated 0.8 %      Immature Granulocytes 0.3 %      Nucleated RBC 0.0 /100 WBC      Neutrophils Absolute 5.88 x10 3/uL      Lymphocytes Absolute Automated 1.92 x10 3/uL      Monocytes Absolute Automated 0.54 x10 3/uL      Eosinophils Absolute Automated 0.18 x10 3/uL      Basophils Absolute Automated 0.07 x10 3/uL      Immature Granulocytes Absolute 0.03 x10 3/uL      Absolute NRBC 0.00 x10 3/uL               Procedures/Radiology performed:   Chest AP Portable    Result Date: 01/20/2022  No acute cardiopulmonary  process. Legrand Pitts, MD 01/20/2022 9:04 AM    XR Chest  AP Portable    Result Date: 01/17/2022  No acute cardiopulmonary disease. Collene Schlichter, MD 01/17/2022 3:02 PM    XR Chest  AP Portable    Result Date: 01/07/2022  1. No acute disease. Kennyth Lose, MD 01/07/2022 7:52 PM        Consultations:     See Hospital Course / Discharge Diagnoses above.     Discharge Condition:     Fair    Discharge Instructions & Follow Up Plan for Patient:     See discharge instructions on the AVS for this encounter.      Follow-up Information       Austin Miles, MD. Go on 01/24/2022.    Specialty: Thoracic and Cardiac Surgery  Why: Preoperative testing (Carotid doppler, vein mapping, radial artery ultrasound) 1 pm.  Contact information:  [address]  [address] [address]  [phone]               Austin Miles, MD. Go on 01/28/2022.    Specialty: Thoracic and Cardiac Surgery  Why: Elective CV Surgical consultation, EKG and Lab testing. Appointment time 8:30 am.  Contact information:  [address]  [address] [place] [address]  [phone]                             Discharge Medications:        Discharge Medication List        Taking      aspirin 81 MG chewable tablet  Dose: 81 mg  What changed: when to take this  Chew 1 tablet (81 mg) by mouth daily     atorvastatin 40 MG tablet  Dose: 40 mg  Commonly known as: LIPITOR  Take 1 tablet (40 mg) by mouth nightly     famotidine 20 MG tablet  Dose: 20 mg  Commonly known as: PEPCID  Take 1 tablet (20 mg) by mouth every 12 (twelve) hours     isosorbide mononitrate 30 MG 24 hr tablet  Dose: 90 mg  Commonly known as: IMDUR  Take 3 tablets (90 mg) by mouth daily     metoprolol succinate XL 25 MG 24 hr tablet  Dose: 25 mg  What changed: when to take this  Commonly known as: TOPROL-XL  Take 1 tablet (25 mg) by mouth daily     nitroglycerin 0.4 MG SL tablet  Dose: 0.4 mg  Commonly known as: NITROSTAT  Place 1 tablet (0.4 mg) under the tongue every 5 (five) minutes as needed for  Chest pain  May repeat up to 3x.  CALL 911 FOR UNRELIEVED CHEST PAIN     ranolazine 500 MG 12 hr tablet  Dose: 1,000 mg  Commonly known as: RANEXA  Take 2 tablets (1,000 mg) by mouth 2 (two) times daily     sertraline 50 MG tablet  Dose: 50 mg  Commonly known as: ZOLOFT  Take 1 tablet (50 mg) by mouth daily              Comments:     Patient was seen and examined on the day of discharge. For more information, please see your MyChart or call [facility] at [phone].  Complete instructions including follow up information were recorded in the patient's After Visit Summary.    >30 minutes spent coordinating discharge and reviewing discharge plan on 01/21/22.    [facility]  Department of Medicine  P: [phone]  F: [phone]    Signed by: Catha Nottingham, DO    CC: Cilia, Early Chars, MD

## 2022-01-21 NOTE — Progress Notes (Signed)
Pt is medically cleared to d/c home with no needs.  Pt's family will assist with transport.      01/21/22 1347   Discharge Disposition   Patient preference/choice provided? Yes   Physical Discharge Disposition Home   Receiving facility, unit and room number: n/a   Nursing report phone number: n/a   Facility fax number: n/a   Mode of Transportation Car   Patient/Family/POA notified of transfer plan Yes   Patient agreeable to discharge plan/expected d/c date? Yes   Family/POA agreeable to discharge plan/expected d/c date? Yes   Bedside nurse notified of transport plan? Yes   CM Interventions   Follow up appointment scheduled? Yes   Notified MD? Yes   Is this a Medicare focused or COVID patient? No   Is this appointment within 48-72 hours? No   Referral made for home health RN visit? No, Other (comment)   Multidisciplinary rounds/family meeting before d/c? Yes   Medicare Checklist   Is this a Medicare patient? No     Carolina Sink, MSW  Social Worker Case Manager 1  [facility]   [phone]

## 2022-01-21 NOTE — Progress Notes (Signed)
01/21/22 1229   Spirometry Pre and/or Post   Procedure Location and Type Bedside- Pre Only   Pre FVC 5.98 L   Pre FEV1 4.1 L   Pre FEV1/FVC % (Calculated) 69     A preliminary copy of the bedside spirometry report is located in the pt. chart in the chart review section under the procedures tab.    Francesco Runner Kaylib Furness, RT

## 2022-01-21 NOTE — Progress Notes (Addendum)
[facility] [place] [facility] INTERVENTIONAL CARDIOLOGY PROGRESS NOTE  [facility]      Date Time: 01/21/22 9:06 AM  Patient Name: Erik [name], Erik [name]  Medical Record #:  [id number]  Account#:  [id number]  Admission Date:  01/20/2022    Primary Cardiologist: Trellis Moment MD, [facility], [facility]     Patient Active Problem List   Diagnosis    Chest pain    History of heart attack    Abnormal exercise myocardial perfusion study    Chronic total occlusion of coronary artery    Chest pain, unspecified type    Coronary artery disease, unspecified vessel or lesion type, unspecified whether angina present, unspecified whether native or transplanted heart       Subjective:   Denies chest pain, SOB or palpitations.    Assessment:   Admit with recurrent diffuse left-sided squeezing chest-discomfort with left arm numbness and tingling: lasting hours to days at a time and then remitting spontaneously and with no relief with either escalation of daily anti-anginal therapy for the past few months or any relief with rescue SLNTG (suggestive of non-cardiac/non-coronary etiology for sx)  Recurrent admissions for non-typical chest discomfort: with no acute ischemic ECG changes and no objective evidence of ischemia by cardiac biomarkers  Known CAD most notable for (RCA CTO and borderline-significant proximal-mid LAD stenosis [DFR 0.89]) by repeated coronary angiography 2023 and significant OM disease.  EF 50-55% by 2022 Echo  6-12 beers per day  1-2 ppd Tobacco  HTN  HLD  TBI  Hyponatremia    Recommendations:   Recommend formal CT surgery consultation for CABG consideration. I have called and spoke with team and high chance of CABG while in house later this week. Dr. Thedore Mins to see later today. Anatomy suitable for bypass given multivessel nature: RCA CTO, Left circumflex OM significant disease and LAD dFR borderline positive.   Recommend formal psych consult for management of anxiety  Continue to stress EtOH and Tobacco cessation   Renal consult  for reassurance that hyponatremia 2/2 decreased PO intake and increased EtOH use. Patient has been anxious since CAD diagnosis and recently lost job due to inability to focus on anything other than his health.   Please keep in house until revascularization occurs.   High risk CTO PCI of RCA still a possibility but first chance success rate based on data is 80-90% chance of success in initial setting. Often times procedures are staged after modification given complexity. Patient also has multi-vessel disease which would require staged procedures to revascularize percutaneously. Best chance for complete revascularization in same setting will be with bypass surgery.    I explained the indication for the CTO PCI, which is primarily for continued anginal symptoms despite the use of antianginals as tolerated.  There may be additional benefit in the setting of planned complete revascularization, which has been shown to improve outcomes, or if a large area of ischemic myocardium is supplied by the CTO vessel, particularly if this matches the area identified on noninvasive stress imaging, but the data is less robust for single-vessel PCI.  We also discussed procedural details including the potential need for multiple access sites, as well as staged procedures to complete the intervention, and discussed the risks, which include but are not limited to a 5-7% risk of major adverse cardiovascular events such as: major bleeding, vascular injury, kidney injury, myocardial infarction, stroke, coronary perforation, need for emergent surgery, and death.  I reviewed not only the risks identified in the literature, but also from my  own personal log of cases which showed a risk of death well under 1%.  There will also likely be a need for prolonged dual anti-platelet therapy following CTO PCI. All questions were answered. Patient will wait for surgeons to consult and consider all of his options.     We will continue to follow.      Medications:      Scheduled Meds:    aspirin, 81 mg, Oral, QAM  atorvastatin, 40 mg, Oral, QHS  enoxaparin, 40 mg, Subcutaneous, Daily  famotidine, 20 mg, Oral, Q12H SCH  isosorbide mononitrate, 90 mg, Oral, Daily  lidocaine viscous, 10 mL, Mouth/Throat, Once  metoprolol succinate XL, 25 mg, Oral, QAM  ranolazine, 1,000 mg, Oral, Q12H SCH        Continuous Infusions:           Physical Exam:       VITAL SIGNS PHYSICAL EXAM   Vitals:    01/21/22 0851   BP: 129/82   Pulse: 62   Resp:    Temp:    SpO2:      Temp (24hrs), Avg:97.9 F (36.6 C), Min:97.7 F (36.5 C), Max:98.2 F (36.8 C)      Telemetry: Reviewed no changes    No intake or output data in the 24 hours ending 01/21/22 0906 Physical Exam  General: awake, alert, breathing comfortably, no acute distress  Head: normocephalic  Eyes: EOM's intact  Cardiovascular: regular rate and rhythm, normal S1, S2, no S3, no S4, no murmurs, rubs or gallops  Neck: no carotid bruits or JVD  Lungs: clear to auscultation bilateraly, without wheezing, rhonchi, or rales  Abdomen: soft, non-tender, non-distended; no palpable masses,  normoactive bowel sounds  Extremities: no edema  Pulse: equal pulses, 4/4 symmetric  Neurological: Alert and oriented X3  Psych: mood and affect appropriate  Musculoskeletal: normal strength and tone         Labs:     Recent Labs   Lab 01/20/22  1453 01/20/22  0841 01/17/22  1412   hs Troponin-I <2.7 <2.7 3.9             Recent Labs   Lab 01/20/22  0841   Bilirubin, Total 0.5   Protein, Total 7.0   Albumin 4.2   ALT 24   AST (SGOT) 19         Recent Labs   Lab 01/17/22  1530   PT 10.6   PT INR 0.9   PTT 28     Recent Labs   Lab 01/20/22  0841 01/17/22  1412   WBC 8.62 7.94   Hgb 14.2 14.2   Hematocrit 39.0 38.6   Platelets 223 208     Recent Labs   Lab 01/21/22  0342 01/20/22  0841 01/17/22  1412   Sodium 130* 128* 127*   Potassium 4.5 4.5 4.1   Chloride 100 94* 96*   CO2 23 25 21    BUN 10.0 6.0* 8.0*   Creatinine 1.1 0.9 0.9   EGFR >60.0 >60.0  >60.0   Glucose 92 97 216*   Calcium 9.1 9.1 8.8           Invalid input(s): FREET4    .  Lab Results   Component Value Date    BNP 96 01/17/2022            01/06/2022     9:39 AM 01/07/2022     6:41 PM 01/08/2022     4:14 AM 01/09/2022  4:30 AM 01/17/2022     2:01 PM 01/20/2022     8:36 AM 01/21/2022     2:35 AM   Weight Monitoring   Height  188 cm   188 cm 188 cm 188 cm   Height Method  Stated   Stated Stated    Weight 84.369 kg 88.451 kg 82.01 kg 79.47 kg 79.379 kg 79.379 kg 79.379 kg   Weight Method  Stated Standing Scale Standing Scale Stated Stated    BMI (calculated)  25.1 kg/m2   22.5 kg/m2 22.5 kg/m2 22.5 kg/m2       Imaging:     Echo Results       None            ____________________________________________    Signed by: Art Buff, MD      [facility]  NP Spectralink [phone] (8am-4:30pm M-F)  MD Philis [place] [phone] or [phone] (8am-5pm M-F)  Arrhythmia Spectralink [phone] (8am-4:30pm M-F)  After hours, non urgent consult line [phone]  After Hours, urgent consults [phone](Answering Service, will page on call MD)

## 2022-01-21 NOTE — UM Notes (Signed)
01/20/22 1039  Adult Admit to Observation  Once         01/20/22 1039     Initial OBS review  Unit: Short stay    46 year old male with hx of CAD, with staged PCI to begin 4/13, presents with recurrent chest pain that occurred last night around 4 am, pressure like, radiating to his left arm. Took nitro with partial relief. Continues to smoke 1.5 packs every other day and 6-12 beers daily.    ED Course:  VS: 98.1, 81, 20, 152/100, 97%  Labs: BUN 6.0, Na 128, Cl 94  CXR: negative  EKG: NSR    Plan:  Trend troponin, telemetry,, trial GI cocktail  Resume BP meds  Hyponatremia - check urine osm, and Na, trend  CIWA  Cardiology consult    January 21, 2022    BP 129/82   Pulse 62   Temp 98.1 F (36.7 C) (Oral)   Resp 18   Ht 1.88 m (6' 2.02")   Wt 79.4 kg (175 lb)   SpO2 97%   BMI 22.46 kg/m     Labs: Na 130,     Troponin negative    Current Facility-Administered Medications   Medication Dose Route Frequency    aspirin  81 mg Oral QAM    atorvastatin  40 mg Oral QHS    enoxaparin  40 mg Subcutaneous Daily    famotidine  20 mg Oral Q12H SCH    isosorbide mononitrate  90 mg Oral Daily    lidocaine viscous  10 mL Mouth/Throat Once    metoprolol succinate XL  25 mg Oral QAM    ranolazine  1,000 mg Oral Q12H SCH     Continuous Infusions:  PRN Meds:.acetaminophen **OR** acetaminophen, alum & mag hydroxide-simethicone, dextrose **OR** dextrose **OR** dextrose **OR** glucagon (rDNA), magnesium sulfate, melatonin, naloxone, nitroglycerin, ondansetron **OR** ondansetron, potassium & sodium phosphates, potassium chloride **AND** potassium chloride    Pollyann Kennedy, RN, BSN CNRN  UR Case Manager   Utilization Review  [facility]   [address]  [address]  [place], [place] [address]  Phone : [phone]   [place] [phone]  Fax: [phone]    [email or web address]

## 2022-01-21 NOTE — Plan of Care (Signed)
CV Surgery Team Update:  Patient Erik [name] has chronic 3VD and has been referred for CABG evaluation.  Dr. Thedore Mins met with patient to discuss indications for coronary revascularization.  At present, patient has social issues and needs to leave the hospital today to care for his dog.  He is agreeable to proceed with an elective CABG.  Prior to discharge today, patient will complete his bedside spirometry testing.  He will be scheduled for an elective CABG Wednesday, February 05, 2022 with Dr. Thedore Mins.      Preoperative testing and consultations dates/times are listed below.       Surgical Date   Wednesday February 05, 2022          Carotid Doppler      Vein Mapping      Radial Artery Ultrasound   Friday April 14th @ 1:00  Surgeon's office  [facility]  [address]  8th floor; [address]  [place], [place] [address]  [phone]       EKG;           (1 tech)   ______________________________    Loney Laurence     (2nd Tech)  Urinalysis; Urine Culture;   MSSA + MRSA swabs (Nares AND Throat);  Type & screen  Cold screen;   APTT ;   PT / INR  A1C;   CBC w/differencial;   CMP  --- Fasting not required for this bloodwork   Tuesday April 18th @ 8:30    [facility]  [address]  7th  floor; [address]  [place], [place] [address]         History & Physical / Education              with Dr. Doristine Church APP -        (Advanced Practice Provider)         Tuesday April 18th @ 9:00  Surgeon's office  [facility]  [address]  8th floor; [address]  [place], [place] [address]  [phone]       Chest CXR / X-ray     Tuesday April 18th -   after above testing is completed   -- walk in  [facility]  [address]  [address]  [place], [place] [address]     SEE                           2nd                      PAGE       Phone Interview       Tuesday April 25th @ 8:00  Nurse from the hospital will call you     If you develop any of the follow symptoms,  please contact our office  IMMEDIATELY  Cough  Shortness of Breath  Runny Nose  Fever  Congestion    You MAY BE required to have   COVID Testing prior to surgery  (PCR Testing or Rapid)           [facility] for                         an appointment if instructed                   to by clinic / office

## 2022-01-22 ENCOUNTER — Telehealth (INDEPENDENT_AMBULATORY_CARE_PROVIDER_SITE_OTHER): Payer: Self-pay | Admitting: Thoracic Surgery (Cardiothoracic Vascular Surgery)

## 2022-01-22 ENCOUNTER — Other Ambulatory Visit (INDEPENDENT_AMBULATORY_CARE_PROVIDER_SITE_OTHER): Payer: Self-pay

## 2022-01-22 DIAGNOSIS — Z01810 Encounter for preprocedural cardiovascular examination: Secondary | ICD-10-CM

## 2022-01-22 DIAGNOSIS — I2510 Atherosclerotic heart disease of native coronary artery without angina pectoris: Secondary | ICD-10-CM

## 2022-01-22 DIAGNOSIS — R9439 Abnormal result of other cardiovascular function study: Secondary | ICD-10-CM

## 2022-01-22 MED ORDER — MUPIROCIN 2 % EX OINT
TOPICAL_OINTMENT | CUTANEOUS | 0 refills | Status: DC
Start: 2022-01-22 — End: 2022-01-28

## 2022-01-22 NOTE — Progress Notes (Signed)
No preop amio d/t drug-drug interaction with zoloft and risk for increased QTi prolongation.

## 2022-01-22 NOTE — Progress Notes (Signed)
Patient seen and examined today. Long discussion about indication/risks/benefits/alternatives of surgery explained. All questions answered. Agree to proceed.    46yo man with mvCAD and recurrent pain but also with ++anxiety. Previously with not deemed to be appropriate surgical candidate due to inadequate LAD stenosis but FFR/IFR done now which shows borderline significant stenosis. Also other blockages not deemed to be amenable to PCI. Explained all of above to patient with also a chance of early LIMA-LAD bypass failure if the stenosis is not truly significant. The patient feels that he keeps having pain and failure of medical management so he wants to proceed with CABG.    Offered inpatient CABG but due to social situation he wanted to leave and have this done as an outpatient. Given that he had no objective evidence of ischemia and no new/worsening lesions on LHC, will arrange for outpatient CABG. Explained that if condition worsens to call the office or come back to the hospital.    He claims to understand all of the above and satisfied with the plan.    Austin Miles, MD  [phone] or [phone]

## 2022-01-22 NOTE — Telephone Encounter (Addendum)
Dr. Austin Miles  Surgery - February 05, 2022  CABG  Same Day Admission

## 2022-01-23 ENCOUNTER — Ambulatory Visit
Admission: RE | Admit: 2022-01-23 | Payer: BC Managed Care – PPO | Source: Ambulatory Visit | Admitting: Cardiovascular Disease

## 2022-01-23 ENCOUNTER — Encounter: Admission: RE | Payer: Self-pay | Source: Ambulatory Visit

## 2022-01-23 DIAGNOSIS — I2582 Chronic total occlusion of coronary artery: Secondary | ICD-10-CM

## 2022-01-23 SURGERY — PTCA FOR CTO
Anesthesia: Conscious Sedation | Site: Arm Lower | Laterality: Left

## 2022-01-24 ENCOUNTER — Ambulatory Visit (INDEPENDENT_AMBULATORY_CARE_PROVIDER_SITE_OTHER): Payer: Commercial Managed Care - POS

## 2022-01-24 ENCOUNTER — Telehealth (INDEPENDENT_AMBULATORY_CARE_PROVIDER_SITE_OTHER): Payer: Self-pay | Admitting: Thoracic Surgery (Cardiothoracic Vascular Surgery)

## 2022-01-24 DIAGNOSIS — Z01810 Encounter for preprocedural cardiovascular examination: Secondary | ICD-10-CM

## 2022-01-24 DIAGNOSIS — R9439 Abnormal result of other cardiovascular function study: Secondary | ICD-10-CM

## 2022-01-24 DIAGNOSIS — I2510 Atherosclerotic heart disease of native coronary artery without angina pectoris: Secondary | ICD-10-CM

## 2022-01-24 LAB — BEDSIDE SPIROMETRY WITHOUT BRONCHODILATOR
FEF 25-75% (Pre-Bronch) %Pred: 60 %
FEF 25-75% (Pre-Bronch) Actual: 2.48 L/sec
FEF 25-75% (Pre-Bronch) Pred: 4.12 L/sec
FEF Max (Pre-Bronch) %Pred: 84 %
FEF Max (Pre-Bronch) Actual: 9.2 L/sec
FEF Max (Pre-Bronch) Pred: 10.89 L/sec
FEF2575 (Lower Limit of Normal): 2.28 L/sec
FEF2575 (Standard Deviation): 1.28 L/sec
FEFMax (Lower Limit of Normal): 8.29 L/sec
FEFMax (Standard Deviation): 1.57 L/sec
FEV1 (Lower Limit of Normal): 3.57 L
FEV1 (Pre-Bronch) %Pred: 90 %
FEV1 (Pre-Bronch) Actual: 4.1 L
FEV1 (Pre-Bronch) Pred: 4.55 L
FEV1 (Standard Deviation): 0.58 L
FEV1/FVC (Pre-Bronch) %Pred: 86 %
FEV1/FVC (Pre-Bronch) Actual: 69 %
FEV1/FVC (Pre-Bronch) Pred: 79 %
FVC (Lower Limit of Normal): 4.56 L
FVC (Pre-Bronch) %Pred: 103 %
FVC (Pre-Bronch) Actual: 5.98 L
FVC (Pre-Bronch) Pred: 5.8 L
FVC (Standard Deviation): 0.76 L
PEF (Pre-Actual): 552.1 L/min

## 2022-01-24 NOTE — Progress Notes (Addendum)
Cardiac Surgery History and Physical    Patient Name: Erik [name] Age: 46 y.o.   Visit Date: 01/28/2022  Gender: male   Date of Birth: 07-15-1976 Race: White   Scheduled Surgeon: Erik Miles MD H&P Performed by: Erik [name]   Referring Physician(s): Erik [name]; Erik Moment, MD (cardiologist) Primary Care Physician: None      Erik [name] is a 46 y.o. male with history of HTN, HLD, TBI, CAD, chronic hyponatremia, depression, alcohol use disorder, and multiple recent admits for non-typical chest discomfort.     CTA Chest abdomen 10/04/21 [facility]) showed no thoracic aorta is normal in caliber with no dissection flap identified. or central pulmonary embolism. Coronary artery calcifications.     Echo 10/05/21 Erik [name]) showed technically difficult echo due to poor acoustic windows.  Normal LVEF 54-56%. Left ventricular diastolic filling parameters demonstrate normal diastolic function. Normal right ventricular size and systolic function. TAPSE 2.8 cm. RV S' 16.7 cm/s.  No significant valvular abnormalities. No prior study is available for comparison.     NM myocardial perfusion spect 11/22/21 (Erik [name]) revealed normal exercise stress study demonstrating normal myocardial perfusion with no evidence of ischemia or infarct. There is a large-sized territory with overall moderately reduced radiotracer uptake on stress images that is predominantly reversible with rest extending from the basal inferior/inferoseptum walls to the mid inferior/inferolateral wall to the apical lateral/inferior walls, which is most consistent with ischemia in the Erik [name] and/or Erik [name] territory.  Note that the basal segments have more significantly reduced uptake when compared to the more apical segments. LVEF 44 % with hypokinesis of the basal inferior wall. There is no prior study for comparison.    LHC 12/12/21 (Erik [name]) showed Erik [name] 100% proximal CTO and OM1 70%. No gradient across AV. LVEDP .     LHC 01/08/22 (Erik [name]) showed ostial  40% stenosis in LAD.  40-50% mid LAD stenosis, positive by DFR (0.89) and FFR (0.79). 70% OM1 stenosis. Normal LV filling pressure without aortic stenosis. Known CTO of Erik [name] (not injected).    Spirometry 01/21/22 (Erik [name]) preliminary showed LVEF1 (pred) 4.55L, FEV1 Actual 4.1L, % predicted 90%.    Patient presents for H&P - patient met Dr. Thedore [name] in house and has CABG scheduled for 02/05/22 with Dr. Thedore [name].   Pt states that the chest pain has subsided with medical management. He has completed all the preop testing. He lives alone in a long term hotel. Unemployed.     STS data points    Diabetes: [] Yes [x] No    Control:  [] None, [] Diet, [] Oral, [] Insulin, [] Other injectable  Dyslipidemia: [x] Yes [] No  HTN: [x] Yes [] No   Family History of Premature CAD (Male Age <26 or Male Age <55) []  Yes [x]  No   Chronic Lung Disease: [] None [x] mild [] moderate [] severe [] unknown   PFT/Spirometry: FEV1 4.10 percent predicted   Current use of Inhalers/Steroids: [] Yes [] No  Sleep Apnea: [] Yes [x] No  Pneumonia: [] Yes [x] No  Depression: [x] Yes [] No  Liver Disease: [] Yes [x] No  Cirrhosis [] Yes [x] No  If Yes, Child-Pugh Class [] A [] B [] C [] unknown  PAD/PVD: [] Yes [x] No  Immunocompromised: [] Yes [x] No  Cancer (within 5 years): [] Yes [x] No  Chest Wall Deformity: [] Yes [x] No  Mediastinal Radiation: [] Yes [x] No  Syncope (cardiac-related/within 1 year): [] Yes [x] No  Arrhythmias: [] Yes [x] No  Hx of Endocarditis: [] Yes [x] No [] Active [] Treated [] Organism:   Illicit Drug Use [] Yes [x] No   IV drug use within 1 year [] Yes [] No   Any drug use within 30 days of procedure [] Yes [] No  Alcohol intake: >=8 drinks/week    Preop Dysphagia Evaluation:  Hx of Upper endoscopy  [] Yes [x] No    Hx of Esophageal cancer   [] Yes [x] No     Hx of Esophageal dilatation  [] Yes [x] No    Hx of Difficulty swallowing  [] Yes [x] No    Hx of Food stuck in throat  [] Yes [x] No    Hx of Radiation to chest  [] Yes [x] No        Past Medical History:   Diagnosis Date    Chest pain  12/10/2021    COVID 09/2021    11/2021: asymptomatic    Hearing loss in Left ear     History of echocardiogram 10/05/2021    EF 54    History of nuclear stress test: exercise SPECT abn 11/22/2021    Hyperlipidemia     on Rx    TBI (traumatic brain injury) 1999     Past Surgical History:   Procedure Laterality Date    DENTAL SURGERY      LHC W/ CORONARY ANGIOS AND LV Left 12/12/2021    Procedure: LHC w/ Coronary Angios and LV;  Surgeon: Cilia, Early Chars, MD;  Location: [facility];  Service: Cardiovascular;  Laterality: Left;    LHC W/ CORONARY ANGIOS AND LV Left 01/08/2022    Procedure: LHC w/ Coronary Angios and LV;  Surgeon: Tawanna Sat, MD;  Location: [facility];  Service: Cardiovascular;  Laterality: Left;  dFR LAD     Social History     Tobacco Use    Smoking status: Every Day     Packs/day: 1.00     Years: 40.00     Pack years: 40.00     Types: Cigarettes    Smokeless tobacco: Never   Vaping Use    Vaping status: Never Used   Substance Use Topics    Alcohol use: Yes     Alcohol/week: 66.0 standard drinks of alcohol     Types: 66 Cans of beer per week     Comment: drinks 6-12 beers per day    Drug use: Never      Social History     Social History Narrative    Widowed. Has one daughter lives out state. He is living in a hotel right now. Unemployed. Mother lives in [place]. Sister lives in [place].       Family History   Problem Relation Age of Onset    Hypothyroidism Mother     Heart attack Father     Hyperlipidemia Sister     Hypertension Sister     Diabetes Brother     COPD Brother     Heart attack Maternal Grandfather        Radiology and Test Review:  Spirometry 01/21/22 Erik [name]) preliminary showed LVEF1 (pred) 4.55L, FEV1 Actual 4.1L, % predicted 90%.    LHC 01/08/22 ([place]) FINDINGS  Left Heart Catheterization  LVEDP: 7 mmHg  No significant LV/AO gradient  Coronary Angiography  Right dominant system  Left Main: Large vessel that bifurcates into the LAD and left Erik [name]. Luminal irregularities  <10%  Left anterior descending: Large vessel which courses along the anterior interventricular septum and wraps around the cardiac apex. Ostial 40% stenosis.  Proximal to mid LAD 40-50% stenosis. DFR .89.  FFR 0.79  Left Erik [name]: Large vessel which courses along the left AV groove. Superior branch of OM1 with 70% proximal disease.  OM2 parent.  Right coronary artery: Not injected.  Known proximal Erik [name]  occlusion and the distal vessel fills via robust L to R collaterals.    LHC 12/12/21 ([place]) FINDINGS  Left Heart Catheterization  LV 117/1/8, EDP 8 mmHg  AO 113/64/84 mmHg  Coronary Angiography  Right dominant system  Left Main: Large and patent. Bifurcates into the left anterior descending and left Erik [name] coronary arteries.   Left anterior descending: Ostial LAD with 40% disease. Proximal into the mid vessel with diffuse 40-50% disease. Distal vessel with mild diffuse disease. D1 small and patent.   Left Erik [name]: Proximal vessel patent leading into a branching OM1. Superior branch of OM1 with 70% proximal disease. Inferior branch patent. OM2 patent.   Right: Proximal Erik [name] CTO with blunt cap. Receives collaterals from a large septal and tortuous epicardial collateral with high frequency turns. The Erik [name] also provides collaterals to the right coronary artery.     NM Miocardio perfusion 11/22/21 Erik [name]) Interpretation:  Study quality was fair.  There was increased subdiaphragmatic radiotracer uptake seen on rest images resulting in ramp filter artifact in basal-mid inferior wall on rest images.  Myocardial SPECT images demonstrate abnormal myocardial perfusion.  There is a large-sized territory with overall moderately reduced radiotracer uptake on stress images that is predominantly reversible with rest extending from the basal inferior/inferoseptum walls to the mid inferior/inferolateral wall to the apical lateral/inferior wall, which is most consistent with ischemia in the Erik [name] and/or Erik [name] territory.   Note that the basal segments have more significantly reduced uptake when compared to the apical segments.  [employer] quantitative analysis confirms this visual interpretation.   The LV does not dilate with stress.  TID ratio 0.90.  Gated images demonstrate abnormal wall motion. Hypokinesis of the basal inferior wall..   The calculated LVEF post-stress is 44 %.     CTA Chest abdomen 10/04/21 Erik [name]) Findings:  Thoracic aorta is normal in caliber with no dissection flap identified.  No aortic wall thickening. The ascending thoracic aorta measures 3.7 cm  at the level of the right pulmonary artery. Normal three-vessel  branching pattern of the arch vessels. No enlarged axillary, mediastinal  or hilar lymph nodes.  Central pulmonary arteries are patent. No pulmonary embolism.  Heart size is normal. No pericardial effusion. Coronary artery  calcifications are present.  Mild dependent groundglass opacities. Upper lobe paraseptal and  centrilobular emphysema.  No discrete suspicious pulmonary nodules.  Abdominal aorta is normal caliber. No dissection flap of the abdominal  aorta. Celiac, SMA, renal arteries and IMA are all patent.   Subcentimeter hypodensity in the lateral right liver, too small to  characterize but likely benign cysts. Normal gallbladder, spleen,  pancreas and adrenal glands.  Kidneys enhance symmetrically. No hydronephrosis.  Normal appendix. No dilated or thickened loops of bowel.  No acute osseous abnormality.    Echo 10/05/21 Erik [name]) Findings  Left Ventricle  The left ventricle is normal in size. There is normal left ventricular geometry. Normal left ventricular systolic function with an ejection fraction by 3D of 56%, by Biplane Method of Discs of 54 %. Left ventricular segmental wall motion is normal. Left ventricular diastolic filling parameters demonstrate normal diastolic function.  Right Ventricle  The right ventricular cavity size is normal in size. Normal right ventricular  systolic function. TAPSE 2.8 cm. RV S' 16.7 cm/s.  Left Atrium  The left atrium is normal in size.  Right Atrium  The right atrium is normal in size.  Atrial Septum  No evidence of interatrial shunt by color Doppler.  Aortic Valve  The  aortic valve is tricuspid. There is no aortic stenosis. There is no aortic regurgitation.  Pulmonary Valve  The pulmonic valve is structurally normal. There is no pulmonic regurgitation.  Mitral Valve  The mitral valve is structurally normal. There is no mitral regurgitation.  Tricuspid Valve  The tricuspid valve is structurally normal. There is trace tricuspid regurgitation. No pulmonary hypertension with estimated right ventricular  systolic pressure of 23 mmHg.  Aorta  The aortic root is normal in size. The ascending aorta is normal in size.  chest pain Inferior Vena Cava  The IVC is normal in size with > 50% respiratory variance consistent with normal RA pressure of 3 mmHg.  Pericardium / Pleural Effusion  No pericardial effusion visualized.    Review of Systems   Constitutional:  Negative for chills, diaphoresis, fatigue, fever and unexpected weight change.   HENT:  Negative for dental problem and trouble swallowing.    Eyes:  Negative for visual disturbance.   Respiratory:  Negative for cough, shortness of breath and wheezing.    Cardiovascular:  Positive for chest pain (intermittent, subsided now with medications). Negative for palpitations and leg swelling.        Denies orthopnea and paroxysmal nocturnal dyspnea.     Denies claudication, varicose veins, or history of thrombosis.    Gastrointestinal:  Negative for abdominal pain, blood in stool, constipation, diarrhea, nausea and vomiting.        Denies indigestion, jaundice, or hepatitis.   Endocrine: Negative for cold intolerance, heat intolerance, polydipsia and polyuria.        Denies thyroid problems.   Genitourinary:  Negative for difficulty urinating, dysuria, frequency, hematuria and urgency.   Musculoskeletal:   Positive for arthralgias (chronic multiple joints).        Denies arthritis or gout.   Skin:  Negative for rash.        Denies any hair or nail changes, or skin lesions   Neurological:  Negative for dizziness, seizures, syncope, weakness, light-headedness and headaches.        Denies strokes or TIAs.   Hematological:  Does not bruise/bleed easily.        Denies anemia, bleeding disorders, blood dyscrasias, or any history of cancer.    Psychiatric/Behavioral:  Positive for dysphoric mood. The patient is not nervous/anxious.        No Known Allergies  Current Outpatient Medications   Medication Sig Dispense Refill    aspirin 81 MG chewable tablet Chew 1 tablet (81 mg) by mouth daily (Patient taking differently: Chew 81 mg by mouth every morning) 90 tablet 3    atorvastatin (LIPITOR) 40 MG tablet Take 1 tablet (40 mg) by mouth nightly 90 tablet 3    famotidine (PEPCID) 20 MG tablet Take 1 tablet (20 mg) by mouth every 12 (twelve) hours 30 tablet 0    isosorbide mononitrate (IMDUR) 30 MG 24 hr tablet Take 3 tablets (90 mg) by mouth daily      metoprolol succinate XL (TOPROL-XL) 25 MG 24 hr tablet Take 1 tablet (25 mg) by mouth daily (Patient taking differently: Take 25 mg by mouth every morning) 90 tablet 3    nitroglycerin (NITROSTAT) 0.4 MG SL tablet Place 1 tablet (0.4 mg) under the tongue every 5 (five) minutes as needed for Chest pain May repeat up to 3x.  CALL 911 FOR UNRELIEVED CHEST PAIN 25 tablet 3    ranolazine (RANEXA) 500 MG 12 hr tablet Take 2 tablets (1,000 mg) by mouth 2 (two) times  daily 180 tablet 3    sertraline (ZOLOFT) 50 MG tablet Take 1 tablet (50 mg) by mouth daily 30 tablet 0    mupirocin (BACTROBAN) 2 % ointment Apply to both nostril twice a day as directed for 5 days prior to surgery. (Patient not taking: Reported on 01/28/2022) 22 g 0     No current facility-administered medications for this visit.        BP 136/86 (BP Site: Left arm, Patient Position: Sitting, Cuff Size: Medium)   Pulse 84    Temp 98.6 F (37 C) (Temporal)   Resp 18   Ht 1.88 m (6\' 2" )   Wt 83 kg (183 lb)   SpO2 98%   BMI 23.50 kg/m     Objective:   Physical Exam  Constitutional:       Appearance: Normal appearance.   HENT:      Mouth/Throat:      Mouth: Mucous membranes are moist.      Dentition: Normal dentition. No gum lesions.   Eyes:      Conjunctiva/sclera: Conjunctivae normal.   Neck:      Vascular: No JVD.   Cardiovascular:      Rate and Rhythm: Normal rate and regular rhythm.      Chest Wall: PMI is not displaced.      Pulses:           Femoral pulses are 2+ on the right side and 2+ on the left side.       Dorsalis pedis pulses are 2+ on the right side and 2+ on the left side.        Posterior tibial pulses are 2+ on the right side and 2+ on the left side.      Heart sounds: S1 normal and S2 normal. No murmur heard.     No friction rub. No gallop.   Pulmonary:      Effort: Pulmonary effort is normal. No accessory muscle usage.      Breath sounds: Normal breath sounds. No wheezing, rhonchi or rales.   Chest:      Chest wall: No mass, deformity or tenderness.   Abdominal:      General: Bowel sounds are normal.      Palpations: Abdomen is soft.      Tenderness: There is no abdominal tenderness.   Musculoskeletal:      Right lower leg: No edema.      Left lower leg: No edema.   Skin:     General: Skin is warm and dry.      Findings: No lesion or rash.   Neurological:      General: No focal deficit present.      Mental Status: He is alert.      Gait: Gait is intact.   Psychiatric:         Mood and Affect: Affect normal.         Behavior: Behavior is cooperative.          Labs:  Lab Results   Component Value Date    WBC 8.62 01/20/2022    HGB 14.2 01/20/2022    HCT 39.0 01/20/2022    MCV 88.2 01/20/2022    PLT 223 01/20/2022     Lab Results   Component Value Date    NA 130 (L) 01/21/2022    K 4.5 01/21/2022    CL 100 01/21/2022    CO2 23 01/21/2022    GLU 92 01/21/2022  BUN 10.0 01/21/2022    CREAT 1.1 01/21/2022    CA 9.1  01/21/2022    PROT 7.0 01/20/2022    ALB 4.2 01/20/2022    GLOB 2.8 01/20/2022    AGRATIO 1.5 01/20/2022    BILITOTAL 0.5 01/20/2022    AST 19 01/20/2022    ALT 24 01/20/2022    ALKPHOS 65 01/20/2022    EGFR >60.0 01/21/2022     Lab Results   Component Value Date    HGBA1C 5.0 10/05/2021         Diagnosis/Assessment/Plan     1. Coronary artery disease of native artery of native heart with stable angina pectoris  Scheduled to have bypass surgery. Continue medical management.     2. Preoperative cardiovascular examination  All the preop testings are done and reviewed     3. Current smoker  Discussed smoking cessation.     4. Alcohol use disorder  Discussed abstain from alcohol     5. Financial difficulties  The pt will need to be referred to [facility] [employer] for primary care referral and community resources.       This is a very pleasant 46 year old gentleman with 3VCAD with stable angina. Current heavy smoking and alcohol use (denies withdrawal symptoms). Last alcohol use was last night. 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      Pt met Dr. Thedore [name] while inpatient.  Patient is in agreement to scheduling surgery at this time.        Plan for CABG with Dr Erik Mins MD on 02/05/22.    Pre Op testing done:   Carotid Doppler  Vein Mapping  Radial Artery Mapping left    2.  Preop clearance needed  None    Stop NSAIDS (Ibuprofen, Motrin, Advil, Celebrex, etc) 7 days prior to surgery  Stop Vitamins and Herbal supplements 7 days prior to surgery  Prescribed Bactroban Ointment (Mupirocin) start 5 days before the surgery per preop protocol    Amiodarone is not prescribed secondary to interaction with zoloft   Full preoperative teaching completed at today's visit.  Pre Op Educational Folder provided to patient  Details of the surgery were discussed, including potential risks, benefits and potential  complications, including death, MI, stroke, bleeding/transfusion and infection.  Continue medical management with PCP and cardiology   Contact our office with any questions and/or concerns as they may arise     Cardiac Surgery Patient Complexity: High Complexity Patient         Erik Ducking, [name]

## 2022-01-24 NOTE — Telephone Encounter (Signed)
*  confirmed appt/vr-01/24/22

## 2022-01-28 ENCOUNTER — Ambulatory Visit (INDEPENDENT_AMBULATORY_CARE_PROVIDER_SITE_OTHER): Payer: Commercial Managed Care - POS | Admitting: Nurse Practitioner

## 2022-01-28 ENCOUNTER — Encounter (INDEPENDENT_AMBULATORY_CARE_PROVIDER_SITE_OTHER): Payer: Self-pay | Admitting: Nurse Practitioner

## 2022-01-28 ENCOUNTER — Ambulatory Visit (FREE_STANDING_LABORATORY_FACILITY): Payer: Commercial Managed Care - POS

## 2022-01-28 VITALS — BP 136/86 | HR 84 | Temp 98.6°F | Resp 18 | Ht 74.0 in | Wt 183.0 lb

## 2022-01-28 DIAGNOSIS — F1090 Alcohol use, unspecified, uncomplicated: Secondary | ICD-10-CM

## 2022-01-28 DIAGNOSIS — I25118 Atherosclerotic heart disease of native coronary artery with other forms of angina pectoris: Secondary | ICD-10-CM

## 2022-01-28 DIAGNOSIS — Z01810 Encounter for preprocedural cardiovascular examination: Secondary | ICD-10-CM

## 2022-01-28 DIAGNOSIS — R9439 Abnormal result of other cardiovascular function study: Secondary | ICD-10-CM

## 2022-01-28 DIAGNOSIS — I2510 Atherosclerotic heart disease of native coronary artery without angina pectoris: Secondary | ICD-10-CM

## 2022-01-28 DIAGNOSIS — Z599 Problem related to housing and economic circumstances, unspecified: Secondary | ICD-10-CM

## 2022-01-28 DIAGNOSIS — Z01818 Encounter for other preprocedural examination: Secondary | ICD-10-CM

## 2022-01-28 DIAGNOSIS — F17200 Nicotine dependence, unspecified, uncomplicated: Secondary | ICD-10-CM

## 2022-01-28 LAB — PT/INR
PT INR: 0.9 (ref 0.9–1.1)
PT: 10.9 s (ref 10.1–12.9)

## 2022-01-28 LAB — HEMOGLOBIN A1C
Average Estimated Glucose: 99.7 mg/dL
Hemoglobin A1C: 5.1 % (ref 4.6–5.9)

## 2022-01-28 LAB — CBC AND DIFFERENTIAL
Absolute NRBC: 0 10*3/uL (ref 0.00–0.00)
Basophils Absolute Automated: 0.06 10*3/uL (ref 0.00–0.08)
Basophils Automated: 0.9 %
Eosinophils Absolute Automated: 0.06 10*3/uL (ref 0.00–0.44)
Eosinophils Automated: 0.9 %
Hematocrit: 43.7 % (ref 37.6–49.6)
Hgb: 15.3 g/dL (ref 12.5–17.1)
Immature Granulocytes Absolute: 0.03 10*3/uL (ref 0.00–0.07)
Immature Granulocytes: 0.4 %
Instrument Absolute Neutrophil Count: 4.33 10*3/uL (ref 1.10–6.33)
Lymphocytes Absolute Automated: 1.89 10*3/uL (ref 0.42–3.22)
Lymphocytes Automated: 27.6 %
MCH: 32.9 pg (ref 25.1–33.5)
MCHC: 35 g/dL (ref 31.5–35.8)
MCV: 94 fL (ref 78.0–96.0)
MPV: 8.8 fL — ABNORMAL LOW (ref 8.9–12.5)
Monocytes Absolute Automated: 0.49 10*3/uL (ref 0.21–0.85)
Monocytes: 7.1 %
Neutrophils Absolute: 4.33 10*3/uL (ref 1.10–6.33)
Neutrophils: 63.1 %
Nucleated RBC: 0 /100 WBC (ref 0.0–0.0)
Platelets: 226 10*3/uL (ref 142–346)
RBC: 4.65 10*6/uL (ref 4.20–5.90)
RDW: 13 % (ref 11–15)
WBC: 6.86 10*3/uL (ref 3.10–9.50)

## 2022-01-28 LAB — GFR: EGFR: >60

## 2022-01-28 LAB — URINALYSIS WITH MICROSCOPIC
Bilirubin, UA: NEGATIVE
Blood, UA: NEGATIVE
Ketones UA: NEGATIVE
Leukocyte Esterase, UA: NEGATIVE
Nitrite, UA: NEGATIVE
Protein, UR: NEGATIVE
Specific Gravity UA: 1.007 (ref 1.001–1.035)
Urine pH: 6.5 (ref 5.0–8.0)
Urobilinogen, UA: NORMAL mg/dL

## 2022-01-28 LAB — COMPREHENSIVE METABOLIC PANEL
ALT: 22 U/L (ref 0–55)
AST (SGOT): 18 U/L (ref 5–41)
Albumin/Globulin Ratio: 1.4 (ref 0.9–2.2)
Albumin: 4.4 g/dL (ref 3.5–5.0)
Alkaline Phosphatase: 70 U/L (ref 37–117)
Anion Gap: 7 (ref 5.0–15.0)
BUN: 10 mg/dL (ref 9.0–28.0)
Bilirubin, Total: 0.4 mg/dL (ref 0.2–1.2)
CO2: 27 mEq/L (ref 17–29)
Calcium: 9.8 mg/dL (ref 8.5–10.5)
Chloride: 95 mEq/L — ABNORMAL LOW (ref 99–111)
Creatinine: 0.9 mg/dL (ref 0.5–1.5)
Globulin: 3.1 g/dL (ref 2.0–3.6)
Glucose: 66 mg/dL — ABNORMAL LOW (ref 70–100)
Potassium: 5.2 mEq/L (ref 3.5–5.3)
Protein, Total: 7.5 g/dL (ref 6.0–8.3)
Sodium: 129 mEq/L — ABNORMAL LOW (ref 135–145)

## 2022-01-28 LAB — ECG 12-LEAD
P Axis: 66 degrees
QRS Duration: 116 ms

## 2022-01-28 LAB — TYPE AND SCREEN
AB Screen Gel: NEGATIVE
ABO Rh: A POS

## 2022-01-28 LAB — HEMOLYSIS INDEX: Hemolysis Index: 6 Index (ref 0–24)

## 2022-01-28 LAB — APTT: PTT: 30 s (ref 27–39)

## 2022-01-28 MED ORDER — MUPIROCIN 2 % EX OINT
TOPICAL_OINTMENT | CUTANEOUS | 0 refills | Status: DC
Start: 2022-01-31 — End: 2022-02-10

## 2022-01-28 NOTE — Progress Notes (Signed)
EKG performed, reviewed by Dr. Paul Dilorenzo.

## 2022-01-29 ENCOUNTER — Ambulatory Visit (INDEPENDENT_AMBULATORY_CARE_PROVIDER_SITE_OTHER): Payer: Commercial Managed Care - POS

## 2022-01-29 LAB — PRESURGICAL SURVEILLANCE, MSSA+MRSA

## 2022-01-29 LAB — COLD AGGLUTININ SCREEN: Cold Screen: NEGATIVE

## 2022-01-30 ENCOUNTER — Encounter (INDEPENDENT_AMBULATORY_CARE_PROVIDER_SITE_OTHER): Payer: Self-pay | Admitting: Cardiovascular Disease

## 2022-01-30 ENCOUNTER — Ambulatory Visit (INDEPENDENT_AMBULATORY_CARE_PROVIDER_SITE_OTHER): Payer: Commercial Managed Care - POS | Admitting: Cardiovascular Disease

## 2022-01-30 VITALS — BP 124/80 | HR 76 | Wt 179.0 lb

## 2022-01-30 DIAGNOSIS — E785 Hyperlipidemia, unspecified: Secondary | ICD-10-CM

## 2022-01-30 DIAGNOSIS — R079 Chest pain, unspecified: Secondary | ICD-10-CM

## 2022-01-30 MED ORDER — ISOSORBIDE MONONITRATE ER 30 MG PO TB24
90.0000 mg | ORAL_TABLET | Freq: Every day | ORAL | 3 refills | Status: DC
Start: 2022-01-30 — End: 2022-02-10

## 2022-01-30 NOTE — Progress Notes (Signed)
[facility] CARDIOLOGY OFFICE PROGRESS NOTE    HRT [facility] OFFICE      [place] [facility] OFFICE -CARDIOLOGY  [address]  [place] [address]  Dept: [phone]  Dept Fax: [phone]       Patient Name: Erik [name]    Date of Visit:  January 30, 2022  Date of Birth: 1976/06/23  AGE: 46 y.o.  Medical Record #: [id number]  Requesting Physician: No primary care provider on file.      CHIEF COMPLAINT: Coronary Artery Disease      HISTORY OF PRESENT ILLNESS:    He is a pleasant 46 y.o. male with a history of newly diagnosed 3V CAD (LAD dFR positive, large OM1 70% and dominant RCA CTO) who is scheduled for CABG 02/05/2022. Patient initially presented to the ED in 09/2021 with chest pain and workup with exercise SPECT showing inferior and inferolateral wall ischemia led to a coronary angiogram in early March 2023 revealing 3VD. Patient was optimized on medications including a BB, ranexa and imdur, with persistent CCS 3 angina and dyspnea. Numerous presentations to the hospital for chest pain not resolved with SL nitro and has been evaluated by surgery and plan is for CABG for his multivessel CAD with Dr. Thedore Mins. This whole diagnosis of CAD has been crippling for him and he has been struggling with anxiety, recently lost his job and has been started on sertraline.     Today patient reports he is continuing to cut down on smoking and drinking. Chest pain is intermittent and he is looking forward to surgery on the 26th. He feels as if the sertraline is helping. No other acute complaints.     PAST MEDICAL HISTORY: He has a past medical history of Chest pain (12/10/2021), COVID (09/2021), Hearing loss in Left ear, History of echocardiogram (10/05/2021), History of nuclear stress test: exercise SPECT abn (11/22/2021), Hyperlipidemia, and TBI (traumatic brain injury) (1999). He has a past surgical history that includes Dental surgery; LHC w/ Coronary Angios and LV (Left, 12/12/2021); and LHC w/  Coronary Angios and LV (Left, 01/08/2022).    ALLERGIES: No Known Allergies    MEDICATIONS:   Patient's current medications were reviewed. ONLY Cardiac medications were updated unless others were addressed in assessment and plan.    Current Outpatient Medications:     aspirin 81 MG chewable tablet, Chew 1 tablet (81 mg) by mouth daily (Patient taking differently: Chew 81 mg by mouth every morning), Disp: 90 tablet, Rfl: 3    atorvastatin (LIPITOR) 40 MG tablet, Take 1 tablet (40 mg) by mouth nightly, Disp: 90 tablet, Rfl: 3    famotidine (PEPCID) 20 MG tablet, Take 1 tablet (20 mg) by mouth every 12 (twelve) hours, Disp: 30 tablet, Rfl: 0    metoprolol succinate XL (TOPROL-XL) 25 MG 24 hr tablet, Take 1 tablet (25 mg) by mouth daily (Patient taking differently: Take 25 mg by mouth every morning), Disp: 90 tablet, Rfl: 3    [START ON 01/31/2022] mupirocin (BACTROBAN) 2 % ointment, Apply to both nostril twice a day as directed for 5 days prior to surgery., Disp: 22 g, Rfl: 0    nitroglycerin (NITROSTAT) 0.4 MG SL tablet, Place 1 tablet (0.4 mg) under the tongue every 5 (five) minutes as needed for Chest pain May repeat up to 3x.  CALL 911 FOR UNRELIEVED CHEST PAIN, Disp: 25 tablet, Rfl: 3    ranolazine (RANEXA) 500 MG 12 hr tablet, Take 2 tablets (1,000 mg) by mouth  2 (two) times daily, Disp: 180 tablet, Rfl: 3    sertraline (ZOLOFT) 50 MG tablet, Take 1 tablet (50 mg) by mouth daily, Disp: 30 tablet, Rfl: 0    isosorbide mononitrate (IMDUR) 30 MG 24 hr tablet, Take 3 tablets (90 mg) by mouth daily, Disp: 270 tablet, Rfl: 3     FAMILY HISTORY: family history includes COPD in his brother; Diabetes in his brother; Heart attack in his father and maternal grandfather; Hyperlipidemia in his sister; Hypertension in his sister; Hypothyroidism in his mother.    SOCIAL HISTORY: He reports that he has been smoking cigarettes. He has a 20.00 pack-year smoking history. He has never used smokeless tobacco. He reports current alcohol  use. He reports that he does not use drugs.    PHYSICAL EXAMINATION    Visit Vitals  BP 124/80 (BP Site: Right arm, Patient Position: Sitting, Cuff Size: Medium)   Pulse 76   Wt 81.2 kg (179 lb)   BMI 22.98 kg/m       Constitutional: Cooperative, alert, no acute distress.  Neck: No carotid bruits, JVP normal.  Cardiac: Regular rate and rhythm, normal S1 and S2; no S3 or S4, no murmurs, no rubs, no gallops.  Pulmonary: Clear to auscultation bilaterally, no wheezing, no rhonchi, no rales.  Extremities: no edema.  Vascular: +2 pulses in radial artery bilaterally, 2+ pedal pulses bilaterally.    LABS:   Lab Results   Component Value Date    WBC 6.86 01/28/2022    HGB 15.3 01/28/2022    HCT 43.7 01/28/2022    PLT 226 01/28/2022     Lab Results   Component Value Date    GLU 66 (L) 01/28/2022    BUN 10.0 01/28/2022    CREAT 0.9 01/28/2022    NA 129 (L) 01/28/2022    K 5.2 01/28/2022    CL 95 (L) 01/28/2022    CO2 27 01/28/2022    AST 18 01/28/2022    ALT 22 01/28/2022     Lab Results   Component Value Date    MG 2.1 01/09/2022    TSH 0.80 10/05/2021    HGBA1C 5.1 01/28/2022    BNP 96 01/17/2022     Lab Results   Component Value Date    CHOL 130 10/05/2021    TRIG 46 10/05/2021    HDL 49 10/05/2021    LDL 72 10/05/2021     Most recent echo, nuclear study and cath reviewed.    IMPRESSION:   [name] is a 46 y.o. male with the following problems:    Chest pain with both typical (elephant on chest) and atypical (reproducible on exam with palpation of chest) features, with serial hs-troponins negative despite many hours of pain (8 hs continuous prior to ED presentation)  CAD on the basis of coronary calcifications seen incidentally on CT chest 12/23 that ruled out dissection, central PE   Family history of CAD father's fatal MI at age 20  Patient remains hemodynamically and electrically stable  EKG does not meet STEMI criteria   HS trop negative  D-Dimer slightly elevated  Discharged home on aspirin, statin, Imdur and BB    Exercise SPECT 11/2021 showing inferior and inferolateral wall ischemia.  Coronary angiogram with 3VD (LAD dFR positive, OM1 70%, RCA CTO)  Recurrent hospitalizations for chest pain   TTE 10/05/21 EF 54%, normal diastolic fxn, no sig valve dz    HTN  HLD  Active smoker   Alcohol use  RECOMMENDATIONS:    CAD with multivessel CAD  Continue aspirin 81 mg daily  Continue metoprolol, Imdur and Ranexa  Continue statin. Goal LDL <70.  Plan is for CABG with Dr. Thedore Mins on 02/05/2022  HTN  Continue current regimen. Well controlled.   HLD  Continue current regimen.   Will repeat lipid panel prior to next visit in 3 months.   Active smoking and EtOH use  Encouraged smoking cessation to continue to cut down of EtOH use. Patient in agreement with plan.     RTC in 3 months, post op to assess how patient is doing. RTC sooner if needed.                                               Orders Placed This Encounter   Procedures    Lipid panel    Office Visit ([facility] [place])       Orders Placed This Encounter   Medications    isosorbide mononitrate (IMDUR) 30 MG 24 hr tablet     Sig: Take 3 tablets (90 mg) by mouth daily     Dispense:  270 tablet     Refill:  3       SIGNED:    Art Buff, MD          This note was generated by the Dragon speech recognition and may contain errors or omissions not intended by the user. Grammatical errors, random word insertions, deletions, pronoun errors, and incomplete sentences are occasional consequences of this technology due to software limitations. Not all errors are caught or corrected. If there are questions or concerns about the content of this note or information contained within the body of this dictation, they should be addressed directly with the author for clarification.

## 2022-02-04 ENCOUNTER — Ambulatory Visit (INDEPENDENT_AMBULATORY_CARE_PROVIDER_SITE_OTHER): Payer: Commercial Managed Care - POS

## 2022-02-04 ENCOUNTER — Encounter (INDEPENDENT_AMBULATORY_CARE_PROVIDER_SITE_OTHER): Payer: Self-pay

## 2022-02-04 DIAGNOSIS — I2510 Atherosclerotic heart disease of native coronary artery without angina pectoris: Secondary | ICD-10-CM

## 2022-02-04 HISTORY — DX: Atherosclerotic heart disease of native coronary artery without angina pectoris: I25.10

## 2022-02-04 NOTE — PSS Phone Screening (Signed)
Pre-Anesthesia Evaluation    Pre-op phone visit requested by:   Reason for pre-op phone visit: Patient anticipating CORONARY ARTERY BYPASS - POSS BIMA; POSS LIMA, ENDOSCOPIC,VEIN HARVEST, ENDOSCOPY, HARVEST RADIAL ARTERY procedure.    No orders of the defined types were placed in this encounter.      History of Present Illness/Summary:        Problem List:  Medical Problems       Hospital Problem List  Date Reviewed: 01/30/2022   None        Non-Hospital Problem List  Date Reviewed: 01/30/2022            ICD-10-CM Priority Class Noted    Chest pain R07.9   10/04/2021    History of heart attack I25.2   11/12/2021    Abnormal exercise myocardial perfusion study R94.39   12/12/2021    Chronic total occlusion of coronary artery I25.82   12/18/2021    Overview Signed 12/18/2021  1:52 PM by Art Buff, MD     Added automatically from request for surgery [phone]           Chest pain, unspecified type R07.9   01/07/2022    Coronary artery disease of native artery with stable angina pectoris I25.118   01/07/2022    Overview Signed 01/08/2022 10:06 AM by Art Buff, MD     Added automatically from request for surgery [phone]           Preoperative cardiovascular examination Z01.810   01/28/2022    Current smoker F17.200   01/28/2022    Alcohol use disorder F10.90   01/28/2022    Financial difficulties Z59.9   01/28/2022        Medical History   Diagnosis Date    Chest pain 12/10/2021    Coronary artery disease involving native coronary artery of native heart without angina pectoris 02/04/2022    pre op dx    COVID 09/2021    11/2021: asymptomatic    Hearing loss in Left ear     History of echocardiogram 10/05/2021    EF 54    History of nuclear stress test: exercise SPECT abn 11/22/2021    Hyperlipidemia     on Rx    TBI (traumatic brain injury) 1999     Past Surgical History:   Procedure Laterality Date    DENTAL SURGERY      LHC W/ CORONARY ANGIOS AND LV Left 12/12/2021    Procedure: LHC w/ Coronary Angios and LV;  Surgeon: Cilia,  Early Chars, MD;  Location: [facility];  Service: Cardiovascular;  Laterality: Left;    LHC W/ CORONARY ANGIOS AND LV Left 01/08/2022    Procedure: LHC w/ Coronary Angios and LV;  Surgeon: Tawanna Sat, MD;  Location: [facility];  Service: Cardiovascular;  Laterality: Left;  dFR LAD        Medication List            Accurate as of February 04, 2022  8:16 AM. Always use your most recent med list.                aspirin 81 MG chewable tablet  Chew 1 tablet (81 mg) by mouth daily  Notes to patient: PER MD     atorvastatin 40 MG tablet  Take 1 tablet (40 mg) by mouth nightly  Commonly known as: LIPITOR  Notes to patient: PER MD     famotidine 20 MG tablet  Take 1  tablet (20 mg) by mouth every 12 (twelve) hours  Commonly known as: PEPCID  Notes to patient: PER MD     isosorbide mononitrate 30 MG 24 hr tablet  Take 3 tablets (90 mg) by mouth daily  Commonly known as: IMDUR  Notes to patient: PER MD     metoprolol succinate XL 25 MG 24 hr tablet  Take 1 tablet (25 mg) by mouth daily  Commonly known as: TOPROL-XL  Notes to patient: PER MD     mupirocin 2 % ointment  Apply to both nostril twice a day as directed for 5 days prior to surgery.  Commonly known as: BACTROBAN  Notes to patient: PER MD     nitroglycerin 0.4 MG SL tablet  Place 1 tablet (0.4 mg) under the tongue every 5 (five) minutes as needed for Chest pain May repeat up to 3x.  CALL 911 FOR UNRELIEVED CHEST PAIN  Commonly known as: NITROSTAT  Notes to patient: PER MD     ranolazine 500 MG 12 hr tablet  Take 2 tablets (1,000 mg) by mouth 2 (two) times daily  Commonly known as: RANEXA  Notes to patient: PER MD     sertraline 50 MG tablet  Take 1 tablet (50 mg) by mouth daily  Commonly known as: ZOLOFT  Notes to patient: PER MD            No Known Allergies  Family History   Problem Relation Age of Onset    Hypothyroidism Mother     Heart attack Father     Hyperlipidemia Sister     Hypertension Sister     Diabetes Brother     COPD Brother     Heart attack  Maternal Grandfather      Social History     Occupational History    Not on file   Tobacco Use    Smoking status: Every Day     Packs/day: 0.50     Years: 40.00     Pack years: 20.00     Types: Cigarettes    Smokeless tobacco: Never   Vaping Use    Vaping status: Never Used   Substance and Sexual Activity    Alcohol use: Yes     Comment: 24-48 drinks/ week    Drug use: Never    Sexual activity: Not on file             Exam Scores:   SDB score Risk Category: No Risk    PONV score Nausea Risk: LOW RISK    MST score MST Score: 0    Allergy score Risk Category: Low Risk    Frailty score         Visit Vitals  Ht 1.88 m (6\' 2" )   Wt 79.4 kg (175 lb)   BMI 22.47 kg/m       Recent Labs   CBC (last 180 days) 01/20/22  0841 01/28/22  0855   WBC 8.62 6.86   RBC 4.42 4.65   Hgb 14.2 15.3   Hematocrit 39.0 43.7   MCV 88.2 94.0   MCH 32.1 32.9   MCHC 36.4* 35.0   RDW 12 13   Platelets 223 226   MPV 8.2* 8.8*   Nucleated RBC 0.0 0.0   Absolute NRBC 0.00 0.00     Recent Labs   BMP (last 180 days) 01/21/22  0342 01/28/22  0855   Glucose 92 66*   BUN 10.0 10.0   Creatinine 1.1 0.9  Sodium 130* 129*   Potassium 4.5 5.2   Chloride 100 95*   CO2 23 27   Calcium 9.1 9.8   Anion Gap 7.0 7.0   EGFR >60.0 >60.0     Recent Labs   Coag Panel (last 180 days) 01/17/22  1530 01/28/22  0855   PTT 28 30   PT 10.6 10.9   PT INR 0.9 0.9     Recent Labs   Other (last 180 days) 10/05/21  0407 01/07/22  2051 01/07/22  2129 01/17/22  1530 01/20/22  0841 01/28/22  0855   TSH 0.80  --   --   --   --   --    ALT  --   --    < >  --  24 22   AST (SGOT)  --   --    < >  --  19 18   Protein, Total  --   --    < >  --  7.0 7.5   Hemoglobin A1C 5.0  --   --   --   --  5.1   NT-proBNP  --  119  --  96  --   --     < > = values in this interval not displayed.     Recent Labs   Type & Screen (last 34 days) 01/28/22  0856   ABO Rh A POS   AB Screen Gel NEG

## 2022-02-05 ENCOUNTER — Inpatient Hospital Stay
Admission: RE | Admit: 2022-02-05 | Discharge: 2022-02-10 | DRG: 235 | Disposition: A | Discharge status: Home / Self Care | Payer: Commercial Managed Care - POS | Source: Ambulatory Visit | Attending: Thoracic Surgery (Cardiothoracic Vascular Surgery) | Admitting: Thoracic Surgery (Cardiothoracic Vascular Surgery)

## 2022-02-05 ENCOUNTER — Inpatient Hospital Stay: Payer: Commercial Managed Care - POS

## 2022-02-05 ENCOUNTER — Encounter
Admission: RE | Disposition: A | Discharge status: Home / Self Care | Payer: Self-pay | Source: Ambulatory Visit | Attending: Thoracic Surgery (Cardiothoracic Vascular Surgery)

## 2022-02-05 ENCOUNTER — Encounter: Payer: Self-pay | Admitting: Thoracic Surgery (Cardiothoracic Vascular Surgery)

## 2022-02-05 DIAGNOSIS — Z951 Presence of aortocoronary bypass graft: Secondary | ICD-10-CM

## 2022-02-05 DIAGNOSIS — I25118 Atherosclerotic heart disease of native coronary artery with other forms of angina pectoris: Secondary | ICD-10-CM

## 2022-02-05 DIAGNOSIS — J9601 Acute respiratory failure with hypoxia: Secondary | ICD-10-CM | POA: Diagnosis not present

## 2022-02-05 DIAGNOSIS — D696 Thrombocytopenia, unspecified: Secondary | ICD-10-CM | POA: Diagnosis present

## 2022-02-05 DIAGNOSIS — Z87820 Personal history of traumatic brain injury: Secondary | ICD-10-CM

## 2022-02-05 DIAGNOSIS — R570 Cardiogenic shock: Secondary | ICD-10-CM | POA: Diagnosis not present

## 2022-02-05 DIAGNOSIS — R079 Chest pain, unspecified: Secondary | ICD-10-CM

## 2022-02-05 DIAGNOSIS — Q2112 Patent foramen ovale: Secondary | ICD-10-CM

## 2022-02-05 DIAGNOSIS — F17210 Nicotine dependence, cigarettes, uncomplicated: Secondary | ICD-10-CM | POA: Diagnosis present

## 2022-02-05 DIAGNOSIS — F419 Anxiety disorder, unspecified: Secondary | ICD-10-CM | POA: Diagnosis present

## 2022-02-05 DIAGNOSIS — E8770 Fluid overload, unspecified: Secondary | ICD-10-CM | POA: Diagnosis present

## 2022-02-05 DIAGNOSIS — E871 Hypo-osmolality and hyponatremia: Secondary | ICD-10-CM | POA: Diagnosis present

## 2022-02-05 DIAGNOSIS — D62 Acute posthemorrhagic anemia: Secondary | ICD-10-CM | POA: Diagnosis not present

## 2022-02-05 DIAGNOSIS — R9439 Abnormal result of other cardiovascular function study: Secondary | ICD-10-CM

## 2022-02-05 DIAGNOSIS — I25110 Atherosclerotic heart disease of native coronary artery with unstable angina pectoris: Principal | ICD-10-CM | POA: Diagnosis present

## 2022-02-05 DIAGNOSIS — I253 Aneurysm of heart: Secondary | ICD-10-CM

## 2022-02-05 DIAGNOSIS — E785 Hyperlipidemia, unspecified: Secondary | ICD-10-CM | POA: Diagnosis present

## 2022-02-05 DIAGNOSIS — Z8616 Personal history of COVID-19: Secondary | ICD-10-CM

## 2022-02-05 DIAGNOSIS — Z01810 Encounter for preprocedural cardiovascular examination: Secondary | ICD-10-CM

## 2022-02-05 DIAGNOSIS — I2510 Atherosclerotic heart disease of native coronary artery without angina pectoris: Secondary | ICD-10-CM

## 2022-02-05 DIAGNOSIS — F32A Depression, unspecified: Secondary | ICD-10-CM | POA: Diagnosis present

## 2022-02-05 HISTORY — PX: ENDOSCOPIC,VEIN HARVEST: SHX3864

## 2022-02-05 HISTORY — PX: ENDOSCOPIC, RADIAL ARTERY HARVEST: SHX3863

## 2022-02-05 HISTORY — PX: ECHOCARDIOGRAM, TRANSESOPHAGEAL: SHX3783

## 2022-02-05 HISTORY — PX: CORONARY ARTERY BYPASS: SHX3487

## 2022-02-05 HISTORY — PX: CLOSURE, PATENT FORAMEN OVALIS: SHX3448

## 2022-02-05 LAB — BLOOD GAS, VENOUS
Base Excess, Ven: -2.5 mEq/L
Base Excess, Ven: -3.5 mEq/L
Base Excess, Ven: 0.1 mEq/L
HCO3, Ven: 22.7 mEq/L
HCO3, Ven: 23 mEq/L
HCO3, Ven: 25.9 mEq/L
O2 Sat, Venous: 95.5 %
O2 Sat, Venous: 77 %
O2 Sat, Venous: 59.6 %
Temperature: 36.5
Temperature: 37
Temperature: 37
Venous Total CO2: 24 mEq/L
Venous Total CO2: 24.6 mEq/L
Venous Total CO2: 27.5 mEq/L
pCO2, Venous: 41.9 mmhg
pCO2, Venous: 52.1 mmhg
pCO2, Venous: 51.1 mmhg
pH, Ven: 7.349
pH, Ven: 7.268
pH, Ven: 7.325
pO2, Venous: 79.6 mmhg
pO2, Venous: 48.1 mmhg
pO2, Venous: 34.4 mmhg

## 2022-02-05 LAB — ABG WITH NA/K/CA IONIZED
Arterial Total CO2: 22.5 mEq/L — ABNORMAL LOW (ref 24.0–30.0)
Arterial Total CO2: 23.7 mEq/L — ABNORMAL LOW (ref 24.0–30.0)
Arterial Total CO2: 23.8 mEq/L — ABNORMAL LOW (ref 24.0–30.0)
Arterial Total CO2: 22.4 mEq/L — ABNORMAL LOW (ref 24.0–30.0)
Arterial Total CO2: 23.7 mEq/L — ABNORMAL LOW (ref 24.0–30.0)
Arterial Total CO2: 24 mEq/L (ref 24.0–30.0)
Base Excess, Arterial: -4.1 mEq/L — ABNORMAL LOW (ref <=2.0)
Base Excess, Arterial: -4.3 mEq/L — ABNORMAL LOW (ref <=2.0)
Base Excess, Arterial: -1.9 mEq/L (ref <=2.0)
Base Excess, Arterial: -2.9 mEq/L — ABNORMAL LOW (ref <=2.0)
Base Excess, Arterial: -3.9 mEq/L — ABNORMAL LOW (ref <=2.0)
Base Excess, Arterial: -1.4 mEq/L (ref <=2.0)
Calcium, Ionized: 2.14 mEq/L — ABNORMAL LOW (ref 2.30–2.58)
Calcium, Ionized: 2.22 mEq/L — ABNORMAL LOW (ref 2.30–2.58)
Calcium, Ionized: 2.09 mEq/L — ABNORMAL LOW (ref 2.30–2.58)
Calcium, Ionized: 2 mEq/L — ABNORMAL LOW (ref 2.30–2.58)
Calcium, Ionized: 2.38 mEq/L (ref 2.30–2.58)
Calcium, Ionized: 2.29 mEq/L — ABNORMAL LOW (ref 2.30–2.58)
FIO2: 40 %
HCO3, Arterial: 21.2 mEq/L — ABNORMAL LOW (ref 23.0–29.0)
HCO3, Arterial: 22.2 mEq/L — ABNORMAL LOW (ref 23.0–29.0)
HCO3, Arterial: 22.6 mEq/L — ABNORMAL LOW (ref 23.0–29.0)
HCO3, Arterial: 21.1 mEq/L — ABNORMAL LOW (ref 23.0–29.0)
HCO3, Arterial: 22.3 mEq/L — ABNORMAL LOW (ref 23.0–29.0)
HCO3, Arterial: 22.9 mEq/L — ABNORMAL LOW (ref 23.0–29.0)
O2 Sat, Arterial: 97.5 % (ref 95.0–100.0)
O2 Sat, Arterial: 99.6 % (ref 95.0–100.0)
O2 Sat, Arterial: 99.2 % (ref 95.0–100.0)
O2 Sat, Arterial: 99.5 % (ref 95.0–100.0)
O2 Sat, Arterial: 99.5 % (ref 95.0–100.0)
O2 Sat, Arterial: 99 % (ref 95.0–100.0)
PEEP: 10
Rate: 14 {beats}/min
Temperature: 37
Temperature: 37
Temperature: 37
Temperature: 36.3
Temperature: 37
Temperature: 37
Tidal vol.: 660
Whole Blood Potassium: 4.9 mEq/L (ref 3.5–5.3)
Whole Blood Potassium: 6.2 mEq/L (ref 3.5–5.3)
Whole Blood Potassium: 7.6 mEq/L (ref 3.5–5.3)
Whole Blood Potassium: 5.1 mEq/L (ref 3.5–5.3)
Whole Blood Potassium: 5.7 mEq/L — ABNORMAL HIGH (ref 3.5–5.3)
Whole Blood Potassium: 4.8 mEq/L (ref 3.5–5.3)
Whole Blood Sodium: 120 mEq/L — ABNORMAL LOW (ref 136–146)
Whole Blood Sodium: 122 mEq/L — ABNORMAL LOW (ref 136–146)
Whole Blood Sodium: 121 mEq/L — ABNORMAL LOW (ref 136–146)
Whole Blood Sodium: 126 mEq/L — ABNORMAL LOW (ref 136–146)
Whole Blood Sodium: 126 mEq/L — ABNORMAL LOW (ref 136–146)
Whole Blood Sodium: 130 mEq/L — ABNORMAL LOW (ref 136–146)
pCO2, Arterial: 43.5 mmhg (ref 35.0–45.0)
pCO2, Arterial: 48.7 mmhg — ABNORMAL HIGH (ref 35.0–45.0)
pCO2, Arterial: 40 mmhg (ref 35.0–45.0)
pCO2, Arterial: 39.6 mmhg (ref 35.0–45.0)
pCO2, Arterial: 43.6 mmhg (ref 35.0–45.0)
pCO2, Arterial: 38.7 mmhg (ref 35.0–45.0)
pH, Arterial: 7.281 — ABNORMAL LOW (ref 7.350–7.450)
pH, Arterial: 7.308 — ABNORMAL LOW (ref 7.350–7.450)
pH, Arterial: 7.371 (ref 7.350–7.450)
pH, Arterial: 7.33 — ABNORMAL LOW (ref 7.350–7.450)
pH, Arterial: 7.343 — ABNORMAL LOW (ref 7.350–7.450)
pH, Arterial: 7.389 (ref 7.350–7.450)
pO2, Arterial: 316 mmhg — ABNORMAL HIGH (ref 80.0–90.0)
pO2, Arterial: 457 mmhg — ABNORMAL HIGH (ref 80.0–90.0)
pO2, Arterial: 229 mmhg — ABNORMAL HIGH (ref 80.0–90.0)
pO2, Arterial: 329 mmhg — ABNORMAL HIGH (ref 80.0–90.0)
pO2, Arterial: 423 mmhg — ABNORMAL HIGH (ref 80.0–90.0)
pO2, Arterial: 145 mmhg — ABNORMAL HIGH (ref 80.0–90.0)

## 2022-02-05 LAB — MAGNESIUM
Magnesium: 2.6 mg/dL (ref 1.6–2.6)
Magnesium: 2.1 mg/dL (ref 1.6–2.6)

## 2022-02-05 LAB — PREPARE CRYOPRECIPITATE
Expiration Date: 202304262029
Expiration Date: 202304270005
ISBT CODE: 6200
ISBT CODE: 6200
Status: TRANSFUSED
Status: TRANSFUSED
UTYPE: A POS
UTYPE: A POS

## 2022-02-05 LAB — COOXIMETRY PROFILE
Carboxyhemoglobin: 1.6 % (ref 0.0–3.0)
Carboxyhemoglobin: 1.8 % (ref 0.0–3.0)
Carboxyhemoglobin: 1.5 % (ref 0.0–3.0)
Hematocrit Total Calculated: 40.7 % (ref 40.0–54.0)
Hematocrit Total Calculated: 30.6 % — ABNORMAL LOW (ref 40.0–54.0)
Hematocrit Total Calculated: 30.1 % — ABNORMAL LOW (ref 40.0–54.0)
Hemoglobin Total: 13.3 g/dL (ref 13.0–17.0)
Hemoglobin Total: 9.9 g/dL — ABNORMAL LOW (ref 13.0–17.0)
Hemoglobin Total: 9.7 g/dL — ABNORMAL LOW (ref 13.0–17.0)
Methemoglobin: 1.1 % (ref 0.0–3.0)
Methemoglobin: 0.8 % (ref 0.0–3.0)
Methemoglobin: 1.4 % (ref 0.0–3.0)
O2 Content: 17.4 Vol %
O2 Content: 10.5 Vol %
O2 Content: 7.9 Vol %
Oxygenated Hemoglobin: 92.9 % (ref 85.0–98.0)
Oxygenated Hemoglobin: 75 % — ABNORMAL LOW (ref 85.0–98.0)
Oxygenated Hemoglobin: 57.9 % — ABNORMAL LOW (ref 85.0–98.0)

## 2022-02-05 LAB — THROMBOELASTOGRAPH CLOTTING PROFILE
TEG Angle: 46.2 Degree — ABNORMAL LOW (ref 55.2–78.4)
TEG CI: -1.3 (ref <=3.0)
TEG K Time: 2 Minutes (ref 0.8–2.8)
TEG MA: 50.7 mm (ref 50.6–69.4)
TEG R Time: 3.8 Minutes (ref 2.5–7.5)

## 2022-02-05 LAB — CBC
Absolute NRBC: 0 10*3/uL (ref 0.00–0.00)
Absolute NRBC: 0 10*3/uL (ref 0.00–0.00)
Hematocrit: 28 % — ABNORMAL LOW (ref 37.6–49.6)
Hematocrit: 26.2 % — ABNORMAL LOW (ref 37.6–49.6)
Hgb: 10.3 g/dL — ABNORMAL LOW (ref 12.5–17.1)
Hgb: 9.8 g/dL — ABNORMAL LOW (ref 12.5–17.1)
MCH: 32.8 pg (ref 25.1–33.5)
MCH: 33.6 pg — ABNORMAL HIGH (ref 25.1–33.5)
MCHC: 36.8 g/dL — ABNORMAL HIGH (ref 31.5–35.8)
MCHC: 37.4 g/dL — ABNORMAL HIGH (ref 31.5–35.8)
MCV: 89.2 fL (ref 78.0–96.0)
MCV: 89.7 fL (ref 78.0–96.0)
MPV: 8.5 fL — ABNORMAL LOW (ref 8.9–12.5)
MPV: 9.3 fL (ref 8.9–12.5)
Nucleated RBC: 0 /100 WBC (ref 0.0–0.0)
Nucleated RBC: 0 /100 WBC (ref 0.0–0.0)
Platelets: 88 10*3/uL — ABNORMAL LOW (ref 142–346)
Platelets: 116 10*3/uL — ABNORMAL LOW (ref 142–346)
RBC: 3.14 10*6/uL — ABNORMAL LOW (ref 4.20–5.90)
RBC: 2.92 10*6/uL — ABNORMAL LOW (ref 4.20–5.90)
RDW: 13 % (ref 11–15)
RDW: 13 % (ref 11–15)
WBC: 8.54 10*3/uL (ref 3.10–9.50)
WBC: 8.66 10*3/uL (ref 3.10–9.50)

## 2022-02-05 LAB — ACTIVATED CLOTTING TIME
ACT POCT: 119 sec. (ref 85–120)
ACT POCT: 446 sec. — ABNORMAL HIGH (ref 85–120)
ACT POCT: 643 sec. — ABNORMAL HIGH (ref 85–120)
ACT POCT: 451 sec. — ABNORMAL HIGH (ref 85–120)
ACT POCT: 613 sec. — ABNORMAL HIGH (ref 85–120)
ACT POCT: 114 sec. (ref 85–120)
ACT POCT: 583 sec. — ABNORMAL HIGH (ref 85–120)

## 2022-02-05 LAB — BLOOD GAS, ARTERIAL
Arterial Total CO2: 24.5 mEq/L (ref 24.0–30.0)
Arterial Total CO2: 24.3 mEq/L (ref 24.0–30.0)
Base Excess, Arterial: -1.4 mEq/L (ref <=2.0)
Base Excess, Arterial: -1.1 mEq/L (ref <=2.0)
FIO2: 100 %
FIO2: 40 %
HCO3, Arterial: 23.3 mEq/L (ref 23.0–29.0)
HCO3, Arterial: 23.1 mEq/L (ref 23.0–29.0)
O2 Sat, Arterial: 99.6 % (ref 95.0–100.0)
O2 Sat, Arterial: 99 % (ref 95.0–100.0)
Rate: 12 {beats}/min
Temperature: 36.4
Temperature: 37
pCO2, Arterial: 40.3 mmhg (ref 35.0–45.0)
pCO2, Arterial: 39 mmhg (ref 35.0–45.0)
pH, Arterial: 7.375 (ref 7.350–7.450)
pH, Arterial: 7.39 (ref 7.350–7.450)
pO2, Arterial: 366 mmhg — ABNORMAL HIGH (ref 80.0–90.0)
pO2, Arterial: 143 mmhg — ABNORMAL HIGH (ref 80.0–90.0)

## 2022-02-05 LAB — BASIC METABOLIC PANEL
Anion Gap: 4 — ABNORMAL LOW (ref 5.0–15.0)
Anion Gap: 7 (ref 5.0–15.0)
BUN: 8 mg/dL — ABNORMAL LOW (ref 9.0–28.0)
BUN: 9 mg/dL (ref 9.0–28.0)
CO2: 24 mEq/L (ref 17–29)
CO2: 23 mEq/L (ref 17–29)
Calcium: 7.5 mg/dL — ABNORMAL LOW (ref 8.5–10.5)
Calcium: 8.2 mg/dL — ABNORMAL LOW (ref 8.5–10.5)
Chloride: 100 mEq/L (ref 99–111)
Chloride: 99 mEq/L (ref 99–111)
Creatinine: 0.8 mg/dL (ref 0.5–1.5)
Creatinine: 0.8 mg/dL (ref 0.5–1.5)
Glucose: 83 mg/dL (ref 70–100)
Glucose: 129 mg/dL — ABNORMAL HIGH (ref 70–100)
Potassium: 4.8 mEq/L (ref 3.5–5.3)
Potassium: 4.8 mEq/L (ref 3.5–5.3)
Sodium: 128 mEq/L — ABNORMAL LOW (ref 135–145)
Sodium: 129 mEq/L — ABNORMAL LOW (ref 135–145)

## 2022-02-05 LAB — RED BLOOD CELLS OR HOLD
Expiration Date: 202305202359
Expiration Date: 202305212359
ISBT CODE: 6200
ISBT CODE: 6200
UTYPE: A POS
UTYPE: A POS

## 2022-02-05 LAB — GLUCOSE WHOLE BLOOD - POCT
Whole Blood Glucose POCT: 93 mg/dL (ref 70–100)
Whole Blood Glucose POCT: 88 mg/dL (ref 70–100)
Whole Blood Glucose POCT: 92 mg/dL (ref 70–100)
Whole Blood Glucose POCT: 107 mg/dL — ABNORMAL HIGH (ref 70–100)
Whole Blood Glucose POCT: 84 mg/dL (ref 70–100)
Whole Blood Glucose POCT: 106 mg/dL — ABNORMAL HIGH (ref 70–100)
Whole Blood Glucose POCT: 125 mg/dL — ABNORMAL HIGH (ref 70–100)
Whole Blood Glucose POCT: 119 mg/dL — ABNORMAL HIGH (ref 70–100)
Whole Blood Glucose POCT: 130 mg/dL — ABNORMAL HIGH (ref 70–100)
Whole Blood Glucose POCT: 136 mg/dL — ABNORMAL HIGH (ref 70–100)
Whole Blood Glucose POCT: 140 mg/dL — ABNORMAL HIGH (ref 70–100)
Whole Blood Glucose POCT: 140 mg/dL — ABNORMAL HIGH (ref 70–100)
Whole Blood Glucose POCT: 142 mg/dL — ABNORMAL HIGH (ref 70–100)

## 2022-02-05 LAB — GFR
EGFR: >60
EGFR: >60

## 2022-02-05 LAB — PREPARE PLATELETS
Expiration Date: 202304272359
Expiration Date: 202304272359
ISBT CODE: 6200
ISBT CODE: 5100
Status: TRANSFUSED
Status: TRANSFUSED
UTYPE: A POS
UTYPE: O POS

## 2022-02-05 LAB — FIBRINOGEN
Fibrinogen: 140 mg/dL — ABNORMAL LOW (ref 181–413)
Fibrinogen: 205 mg/dL (ref 181–413)
Fibrinogen: 286 mg/dL (ref 181–413)

## 2022-02-05 LAB — PT/INR
PT INR: 1.1 (ref 0.9–1.1)
PT INR: 1 (ref 0.9–1.1)
PT: 12.8 s (ref 10.1–12.9)
PT: 11.7 s (ref 10.1–12.9)

## 2022-02-05 LAB — TOTAL HEMOGLOBIN GROUP
Hematocrit Total Calculated: 30.4 % — ABNORMAL LOW (ref 40.0–54.0)
Hematocrit Total Calculated: 33.7 % — ABNORMAL LOW (ref 40.0–54.0)
Hematocrit Total Calculated: 30.3 % — ABNORMAL LOW (ref 40.0–54.0)
Hematocrit Total Calculated: 28.7 % — ABNORMAL LOW (ref 40.0–54.0)
Hematocrit Total Calculated: 28.8 % — ABNORMAL LOW (ref 40.0–54.0)
Hematocrit Total Calculated: 29.7 % — ABNORMAL LOW (ref 40.0–54.0)
Hemoglobin Total: 10.9 g/dL — ABNORMAL LOW (ref 13.0–17.0)
Hemoglobin Total: 9.8 g/dL — ABNORMAL LOW (ref 13.0–17.0)
Hemoglobin Total: 9.8 g/dL — ABNORMAL LOW (ref 13.0–17.0)
Hemoglobin Total: 9.3 g/dL — ABNORMAL LOW (ref 13.0–17.0)
Hemoglobin Total: 9.3 g/dL — ABNORMAL LOW (ref 13.0–17.0)
Hemoglobin Total: 9.6 g/dL — ABNORMAL LOW (ref 13.0–17.0)

## 2022-02-05 LAB — HEPARIN ASSAY
Heparin Assay Test Concentration: 1.7 mg/kg
Heparin Assay Test Concentration: 2 mg/kg
Heparin Assay Test Concentration: 2.5 mg/kg
Heparin Assay Test Concentration: 2 mg/kg
Heparin Assay Test Concentration: 2 mg/kg
Heparin Assay Test Concentration: 0 mg/kg
Heparin Assay Test Concentration: 2.5 mg/kg
Total Protamine Dose: 0 mg
Total Protamine Dose: 0 mg
Total Protamine Dose: 0 mg
Total Protamine Dose: 0 mg
Total Protamine Dose: 0 mg
Total Protamine Dose: 0 mg
Total Protamine Dose: 0 mg
Total Units of Heparin Required: 20000 Units
Total Units of Heparin Required: 0 Units
Total Units of Heparin Required: 10000 Units
Total Units of Heparin Required: 0 Units
Total Units of Heparin Required: 10000 Units
Total Units of Heparin Required: 0 Units
Total Units of Heparin Required: 0 Units

## 2022-02-05 LAB — CBC AND DIFFERENTIAL
Absolute NRBC: 0 10*3/uL (ref 0.00–0.00)
Basophils Absolute Automated: 0.02 10*3/uL (ref 0.00–0.08)
Basophils Automated: 0.3 %
Eosinophils Absolute Automated: 0 10*3/uL (ref 0.00–0.44)
Eosinophils Automated: 0 %
Hematocrit: 25.2 % — ABNORMAL LOW (ref 37.6–49.6)
Hgb: 9.2 g/dL — ABNORMAL LOW (ref 12.5–17.1)
Immature Granulocytes Absolute: 0.02 10*3/uL (ref 0.00–0.07)
Immature Granulocytes: 0.3 %
Instrument Absolute Neutrophil Count: 6.32 10*3/uL (ref 1.10–6.33)
Lymphocytes Absolute Automated: 0.49 10*3/uL (ref 0.42–3.22)
Lymphocytes Automated: 6.8 %
MCH: 33 pg (ref 25.1–33.5)
MCHC: 36.5 g/dL — ABNORMAL HIGH (ref 31.5–35.8)
MCV: 90.3 fL (ref 78.0–96.0)
MPV: 9.3 fL (ref 8.9–12.5)
Monocytes Absolute Automated: 0.34 10*3/uL (ref 0.21–0.85)
Monocytes: 4.7 %
Neutrophils Absolute: 6.32 10*3/uL (ref 1.10–6.33)
Neutrophils: 87.9 %
Nucleated RBC: 0 /100 WBC (ref 0.0–0.0)
Platelets: 134 10*3/uL — ABNORMAL LOW (ref 142–346)
RBC: 2.79 10*6/uL — ABNORMAL LOW (ref 4.20–5.90)
RDW: 13 % (ref 11–15)
WBC: 7.19 10*3/uL (ref 3.10–9.50)

## 2022-02-05 LAB — PT AND APTT
PT INR: 1.2 — ABNORMAL HIGH (ref 0.9–1.1)
PT INR: 1 (ref 0.9–1.1)
PT: 13.8 s — ABNORMAL HIGH (ref 10.1–12.9)
PT: 12.1 s (ref 10.1–12.9)
PTT: 28 s (ref 27–39)
PTT: 33 s (ref 27–39)

## 2022-02-05 LAB — LACTIC ACID, PLASMA
Lactic Acid: 0.9 mmol/L (ref 0.2–2.0)
Lactic Acid: 0.6 mmol/L (ref 0.2–2.0)
Lactic Acid: 0.6 mmol/L (ref 0.2–2.0)
Lactic Acid: 1.1 mmol/L (ref 0.2–2.0)
Lactic Acid: 1.2 mmol/L (ref 0.2–2.0)
Lactic Acid: 1.6 mmol/L (ref 0.2–2.0)
Lactic Acid: 0.8 mmol/L (ref 0.2–2.0)

## 2022-02-05 LAB — APTT
PTT: 32 s (ref 27–39)
PTT: 25 s — ABNORMAL LOW (ref 27–39)

## 2022-02-05 LAB — PLATELET AGGREGATION CV OR
Functional Platelet Count: 51 10*3
Functional Platelet Count: 82 10*3
Total Platelet Count CVOR: 95 10*3
Total Platelet Count CVOR: 140 10*3

## 2022-02-05 LAB — GLUCOSE WHOLE BLOOD
Whole Blood Glucose: 114 mg/dL — ABNORMAL HIGH (ref 70–100)
Whole Blood Glucose: 124 mg/dL — ABNORMAL HIGH (ref 70–100)
Whole Blood Glucose: 195 mg/dL — ABNORMAL HIGH (ref 70–100)
Whole Blood Glucose: 155 mg/dL — ABNORMAL HIGH (ref 70–100)
Whole Blood Glucose: 187 mg/dL — ABNORMAL HIGH (ref 70–100)
Whole Blood Glucose: 133 mg/dL — ABNORMAL HIGH (ref 70–100)

## 2022-02-05 LAB — BLOOD TYPE CONFIRMATION: Blood Type Confirmation: A POS

## 2022-02-05 SURGERY — CORONARY ARTERY BYPASS
Anesthesia: Anesthesia General | Site: Wrist | Wound class: Clean

## 2022-02-05 MED ORDER — PHENYLEPHRINE 100 MCG/ML IV SOSY (WRAP)
PREFILLED_SYRINGE | INTRAVENOUS | Status: AC
Start: 2022-02-05 — End: ?
  Filled 2022-02-05: qty 10

## 2022-02-05 MED ORDER — GABAPENTIN 300 MG PO CAPS
300.0000 mg | ORAL_CAPSULE | ORAL | Status: AC
Start: 2022-02-05 — End: 2022-02-05

## 2022-02-05 MED ORDER — ACETAMINOPHEN 500 MG PO TABS
ORAL_TABLET | ORAL | Status: AC
Start: 2022-02-05 — End: 2022-02-05
  Administered 2022-02-05: 1000 mg via ORAL
  Filled 2022-02-05: qty 2

## 2022-02-05 MED ORDER — VEIN SOLUTION (OR ONLY) (FX)
Status: DC | PRN
Start: 2022-02-05 — End: 2022-02-05
  Administered 2022-02-05: 2

## 2022-02-05 MED ORDER — METOPROLOL TARTRATE 25 MG PO TABS
12.5000 mg | ORAL_TABLET | Freq: Two times a day (BID) | ORAL | Status: DC
Start: 2022-02-06 — End: 2022-02-06
  Administered 2022-02-06: 12.5 mg via ORAL
  Filled 2022-02-05: qty 1

## 2022-02-05 MED ORDER — NICARDIPINE HCL 2.5 MG/ML IV SOLN
INTRAVENOUS | Status: AC
Start: 2022-02-05 — End: ?
  Filled 2022-02-05: qty 10

## 2022-02-05 MED ORDER — POTASSIUM CHLORIDE 20 MEQ PO PACK
0.0000 meq | PACK | ORAL | Status: DC | PRN
Start: 2022-02-05 — End: 2022-02-07

## 2022-02-05 MED ORDER — MANNITOL 20 % IV SOLN
INTRAVENOUS | Status: AC
Start: 2022-02-05 — End: ?
  Filled 2022-02-05: qty 250

## 2022-02-05 MED ORDER — PHENYLEPHRINE 100 MCG/ML IV BOLUS (ANESTHESIA)
PREFILLED_SYRINGE | INTRAVENOUS | Status: DC | PRN
Start: 2022-02-05 — End: 2022-02-05
  Administered 2022-02-05 (×3): 100 ug via INTRAVENOUS
  Administered 2022-02-05 (×2): 50 ug via INTRAVENOUS
  Administered 2022-02-05: 100 ug via INTRAVENOUS
  Administered 2022-02-05 (×7): 50 ug via INTRAVENOUS

## 2022-02-05 MED ORDER — AMIODARONE HCL 200 MG PO TABS
200.0000 mg | ORAL_TABLET | Freq: Two times a day (BID) | ORAL | Status: DC
Start: 2022-02-05 — End: 2022-02-10
  Administered 2022-02-05 – 2022-02-10 (×10): 200 mg via ORAL
  Filled 2022-02-05 (×10): qty 1

## 2022-02-05 MED ORDER — EPINEPHRINE-DEXTROSE 5-5 MG/250ML-% IV SOLN (WRAP)
INTRAVENOUS | Status: AC
Start: 2022-02-05 — End: ?
  Filled 2022-02-05: qty 250

## 2022-02-05 MED ORDER — ALBUMIN HUMAN/BIOSIMILIAR 5% IV SOLN (WRAP)
INTRAVENOUS | Status: AC
Start: 2022-02-05 — End: ?
  Filled 2022-02-05: qty 750

## 2022-02-05 MED ORDER — CALCIUM GLUCONATE-NACL 1-0.675 GM/50ML-% IV SOLN
1.0000 g | INTRAVENOUS | Status: AC
Start: 2022-02-05 — End: 2022-02-05
  Administered 2022-02-05 (×2): 1 g via INTRAVENOUS
  Filled 2022-02-05 (×2): qty 50

## 2022-02-05 MED ORDER — HYDRALAZINE HCL 20 MG/ML IJ SOLN
10.0000 mg | INTRAMUSCULAR | Status: DC | PRN
Start: 2022-02-05 — End: 2022-02-07

## 2022-02-05 MED ORDER — CHLORHEXIDINE GLUCONATE 0.12 % MT SOLN
OROMUCOSAL | Status: AC
Start: 2022-02-05 — End: 2022-02-05
  Administered 2022-02-05: 15 mL via OROMUCOSAL
  Filled 2022-02-05: qty 15

## 2022-02-05 MED ORDER — SENNOSIDES-DOCUSATE SODIUM 8.6-50 MG PO TABS
2.0000 | ORAL_TABLET | Freq: Two times a day (BID) | ORAL | Status: DC
Start: 2022-02-05 — End: 2022-02-07
  Administered 2022-02-05 – 2022-02-07 (×3): 2 via ORAL
  Filled 2022-02-05 (×4): qty 2

## 2022-02-05 MED ORDER — SODIUM CHLORIDE 0.9 % IV SOLN
INTRAVENOUS | Status: DC | PRN
Start: 2022-02-05 — End: 2022-02-05
  Administered 2022-02-05: 2 [IU]/h via INTRAVENOUS
  Administered 2022-02-05: 10 [IU] via INTRAVENOUS

## 2022-02-05 MED ORDER — VANCOMYCIN HCL IN NACL 1.5-0.9 GM/500ML-% IV SOLN
INTRAVENOUS | Status: AC
Start: 2022-02-05 — End: ?
  Filled 2022-02-05: qty 500

## 2022-02-05 MED ORDER — MIDAZOLAM HCL 1 MG/ML IJ SOLN (WRAP)
INTRAMUSCULAR | Status: DC | PRN
Start: 2022-02-05 — End: 2022-02-05
  Administered 2022-02-05 (×2): 1 mg via INTRAVENOUS
  Administered 2022-02-05: 2 mg via INTRAVENOUS

## 2022-02-05 MED ORDER — POLYETHYLENE GLYCOL 3350 17 G PO PACK
17.0000 g | PACK | Freq: Every day | ORAL | Status: DC
Start: 2022-02-05 — End: 2022-02-07
  Filled 2022-02-05 (×2): qty 1

## 2022-02-05 MED ORDER — GLYCOPYRROLATE 0.2 MG/ML IJ SOLN (WRAP)
INTRAMUSCULAR | Status: AC
Start: 2022-02-05 — End: ?
  Filled 2022-02-05: qty 1

## 2022-02-05 MED ORDER — MAGNESIUM SULFATE 50 % IJ SOLN
INTRAMUSCULAR | Status: AC
Start: 2022-02-05 — End: ?
  Filled 2022-02-05: qty 10

## 2022-02-05 MED ORDER — EPHEDRINE SULFATE 50 MG/ML IJ/IV SOLN (WRAP)
Status: AC
Start: 2022-02-05 — End: ?
  Filled 2022-02-05: qty 1

## 2022-02-05 MED ORDER — ALBUMIN HUMAN/BIOSIMILIAR 5% IV SOLN (WRAP)
INTRAVENOUS | Status: DC | PRN
Start: 2022-02-05 — End: 2022-02-05

## 2022-02-05 MED ORDER — LIDOCAINE HCL 2 % IJ SOLN
INTRAMUSCULAR | Status: DC | PRN
Start: 2022-02-05 — End: 2022-02-05
  Administered 2022-02-05: 100 mg

## 2022-02-05 MED ORDER — DEXTROSE 50 % IV SOLN
12.5000 g | INTRAVENOUS | Status: DC | PRN
Start: 2022-02-05 — End: 2022-02-06

## 2022-02-05 MED ORDER — CHLORHEXIDINE GLUCONATE 0.12 % MT SOLN
15.0000 mL | Freq: Once | OROMUCOSAL | Status: AC
Start: 2022-02-05 — End: 2022-02-05

## 2022-02-05 MED ORDER — PROPOFOL 10 MG/ML IV EMUL (WRAP)
INTRAVENOUS | Status: AC
Start: 2022-02-05 — End: ?
  Filled 2022-02-05: qty 20

## 2022-02-05 MED ORDER — DEXMEDETOMIDINE HCL IN NACL 200 MCG/50ML IV SOLN
INTRAVENOUS | Status: AC
Start: 2022-02-05 — End: ?
  Filled 2022-02-05: qty 50

## 2022-02-05 MED ORDER — CEFUROXIME SODIUM 1.5 G IJ/IV SOLR (WRAP)
Status: AC
Start: 2022-02-05 — End: ?
  Filled 2022-02-05: qty 1500

## 2022-02-05 MED ORDER — DEXMEDETOMIDINE HCL IN NACL 400 MCG/100ML IV SOLN
0.1000 ug/kg/h | INTRAVENOUS | Status: AC
Start: 2022-02-05 — End: 2022-02-06
  Administered 2022-02-05: 0.3 ug/kg/h via INTRAVENOUS
  Filled 2022-02-05 (×3): qty 100

## 2022-02-05 MED ORDER — CHLORHEXIDINE GLUCONATE 0.12 % MT SOLN
15.0000 mL | Freq: Two times a day (BID) | OROMUCOSAL | Status: DC
Start: 2022-02-05 — End: 2022-02-09
  Administered 2022-02-05 – 2022-02-07 (×5): 15 mL via OROMUCOSAL
  Filled 2022-02-05 (×5): qty 15

## 2022-02-05 MED ORDER — ACETAMINOPHEN 500 MG PO TABS
1000.0000 mg | ORAL_TABLET | ORAL | Status: AC
Start: 2022-02-05 — End: 2022-02-05

## 2022-02-05 MED ORDER — DEXTROSE 10 % IV BOLUS
12.5000 g | INTRAVENOUS | Status: DC | PRN
Start: 2022-02-05 — End: 2022-02-06

## 2022-02-05 MED ORDER — NOREPINEPHRINE BITARTRATE 1 MG/ML IV SOLN
INTRAVENOUS | Status: DC | PRN
Start: 2022-02-05 — End: 2022-02-05
  Administered 2022-02-05: 1 ug/min via INTRAVENOUS
  Administered 2022-02-05: 2 ug/min via INTRAVENOUS

## 2022-02-05 MED ORDER — FENTANYL CITRATE (PF) 50 MCG/ML IJ SOLN (WRAP)
INTRAMUSCULAR | Status: AC
Start: 2022-02-05 — End: ?
  Filled 2022-02-05: qty 2

## 2022-02-05 MED ORDER — LACTATED RINGERS IV SOLN
INTRAVENOUS | Status: DC
Start: 2022-02-05 — End: 2022-02-05

## 2022-02-05 MED ORDER — SODIUM CHLORIDE 0.9 % IV SOLN
INTRAVENOUS | Status: DC | PRN
Start: 2022-02-05 — End: 2022-02-05

## 2022-02-05 MED ORDER — PROTAMINE SULFATE 10 MG/ML IV SOLN
INTRAVENOUS | Status: DC | PRN
Start: 2022-02-05 — End: 2022-02-05
  Administered 2022-02-05: 250 mg via INTRAVENOUS

## 2022-02-05 MED ORDER — SODIUM CHLORIDE 0.9 % IV SOLN
INTRAVENOUS | Status: DC | PRN
Start: 2022-02-05 — End: 2022-02-06

## 2022-02-05 MED ORDER — FAMOTIDINE 20 MG PO TABS
20.0000 mg | ORAL_TABLET | Freq: Two times a day (BID) | ORAL | Status: DC
Start: 2022-02-05 — End: 2022-02-07
  Administered 2022-02-05 – 2022-02-07 (×5): 20 mg via ORAL
  Filled 2022-02-05 (×5): qty 1

## 2022-02-05 MED ORDER — POTASSIUM CHLORIDE 2 MEQ/ML IV SOLN
INTRAVENOUS | Status: AC
Start: 2022-02-05 — End: ?
  Filled 2022-02-05: qty 40

## 2022-02-05 MED ORDER — MILRINONE LACTATE IN DEXTROSE 20-5 MG/100ML-% IV SOLN
INTRAVENOUS | Status: AC
Start: 2022-02-05 — End: ?
  Filled 2022-02-05: qty 100

## 2022-02-05 MED ORDER — NITROGLYCERIN IN D5W 200-5 MCG/ML-% IV SOLN
10.0000 ug/min | INTRAVENOUS | Status: DC | PRN
Start: 2022-02-05 — End: 2022-02-07

## 2022-02-05 MED ORDER — SODIUM CHLORIDE 0.9 % IV MBP
INTRAVENOUS | Status: DC | PRN
Start: 2022-02-05 — End: 2022-02-05
  Administered 2022-02-05 (×2): 1.5 g via INTRAVENOUS

## 2022-02-05 MED ORDER — ACETAMINOPHEN 500 MG PO TABS
1000.0000 mg | ORAL_TABLET | Freq: Four times a day (QID) | ORAL | Status: DC
Start: 2022-02-05 — End: 2022-02-07
  Administered 2022-02-05 – 2022-02-07 (×5): 1000 mg via ORAL
  Filled 2022-02-05 (×6): qty 2

## 2022-02-05 MED ORDER — CALCIUM CHLORIDE 10 % IV SOLN
INTRAVENOUS | Status: DC | PRN
Start: 2022-02-05 — End: 2022-02-05
  Administered 2022-02-05: 100 mg via INTRAVENOUS
  Administered 2022-02-05: 200 mg via INTRAVENOUS

## 2022-02-05 MED ORDER — SODIUM CHLORIDE 0.9 % IV MBP
1.5000 g | Freq: Three times a day (TID) | INTRAVENOUS | Status: AC
Start: 2022-02-05 — End: 2022-02-06
  Administered 2022-02-05 – 2022-02-06 (×3): 1.5 g via INTRAVENOUS
  Filled 2022-02-05 (×3): qty 1500

## 2022-02-05 MED ORDER — LIDOCAINE 4 % EX CREA
TOPICAL_CREAM | Freq: Once | CUTANEOUS | Status: DC | PRN
Start: 2022-02-05 — End: 2022-02-07

## 2022-02-05 MED ORDER — MAGNESIUM OXIDE 400 MG TABS (WRAP)
400.0000 mg | ORAL_TABLET | ORAL | Status: DC | PRN
Start: 2022-02-05 — End: 2022-02-07
  Administered 2022-02-07: 400 mg via ORAL
  Filled 2022-02-05: qty 1

## 2022-02-05 MED ORDER — MIDAZOLAM HCL 1 MG/ML IJ SOLN (WRAP)
INTRAMUSCULAR | Status: AC
Start: 2022-02-05 — End: ?
  Filled 2022-02-05: qty 2

## 2022-02-05 MED ORDER — HEPARIN SODIUM (PORCINE) 1000 UNIT/ML IJ SOLN
INTRAMUSCULAR | Status: DC | PRN
Start: 2022-02-05 — End: 2022-02-05
  Administered 2022-02-05: 20000 [IU] via INTRAVENOUS
  Administered 2022-02-05: 10000 [IU] via INTRAVENOUS

## 2022-02-05 MED ORDER — MUPIROCIN 2 % NASAL OINT
TOPICAL_OINTMENT | Freq: Two times a day (BID) | NASAL | Status: DC
Start: 2022-02-05 — End: 2022-02-07
  Administered 2022-02-05 – 2022-02-07 (×5): 0.9 via NASAL
  Filled 2022-02-05 (×5): qty 0.9

## 2022-02-05 MED ORDER — HYDROMORPHONE HCL 0.5 MG/0.5 ML IJ SOLN
0.2500 mg | INTRAMUSCULAR | Status: AC | PRN
Start: 2022-02-05 — End: 2022-02-05
  Administered 2022-02-05: 0.25 mg via INTRAVENOUS
  Filled 2022-02-05: qty 1

## 2022-02-05 MED ORDER — TRANEXAMIC ACID-NACL 1000-0.7 MG/100ML-% IV SOLN (NARRATOR USE ONLY)
INTRAVENOUS | Status: DC | PRN
Start: 2022-02-05 — End: 2022-02-05
  Administered 2022-02-05: 866 mg via INTRAVENOUS
  Administered 2022-02-05: 2 mg/kg/h via INTRAVENOUS

## 2022-02-05 MED ORDER — NOREPINEPHRINE BITARTRATE 1 MG/ML IV SOLN
INTRAVENOUS | Status: AC
Start: 2022-02-05 — End: ?
  Filled 2022-02-05: qty 4

## 2022-02-05 MED ORDER — SODIUM PHOSPHATES 3 MMOLE/ML IV SOLN (WRAP)
15.0000 mmol | INTRAVENOUS | Status: DC | PRN
Start: 2022-02-05 — End: 2022-02-07

## 2022-02-05 MED ORDER — ACETAMINOPHEN 10 MG/ML IV SOLN
1000.0000 mg | Freq: Once | INTRAVENOUS | Status: AC
Start: 2022-02-05 — End: 2022-02-05
  Administered 2022-02-05: 1000 mg via INTRAVENOUS
  Filled 2022-02-05: qty 100

## 2022-02-05 MED ORDER — NICARDIPINE IV BOLUS SYRINGE (ANESTHESIA)
INTRAVENOUS | Status: DC | PRN
Start: 2022-02-05 — End: 2022-02-05
  Administered 2022-02-05: .4 mg via INTRAVENOUS

## 2022-02-05 MED ORDER — GLYCOPYRROLATE 0.2 MG/ML IJ SOLN (WRAP)
INTRAMUSCULAR | Status: DC | PRN
Start: 2022-02-05 — End: 2022-02-05
  Administered 2022-02-05 (×2): .1 mg via INTRAVENOUS
  Administered 2022-02-05: .8 mg via INTRAVENOUS

## 2022-02-05 MED ORDER — ROCURONIUM BROMIDE 10 MG/ML IV SOLN (WRAP)
INTRAVENOUS | Status: DC | PRN
Start: 2022-02-05 — End: 2022-02-05
  Administered 2022-02-05: 10 mg via INTRAVENOUS
  Administered 2022-02-05: 100 mg via INTRAVENOUS
  Administered 2022-02-05: 50 mg via INTRAVENOUS
  Administered 2022-02-05: 30 mg via INTRAVENOUS

## 2022-02-05 MED ORDER — NOREPINEPHRINE-DEXTROSE 8-5 MG/250ML-% IV SOLN
INTRAVENOUS | Status: AC
Start: 2022-02-05 — End: ?
  Filled 2022-02-05: qty 250

## 2022-02-05 MED ORDER — FENTANYL CITRATE (PF) 50 MCG/ML IJ SOLN (WRAP)
INTRAMUSCULAR | Status: DC | PRN
Start: 2022-02-05 — End: 2022-02-05
  Administered 2022-02-05 (×2): 150 ug via INTRAVENOUS
  Administered 2022-02-05: 100 ug via INTRAVENOUS
  Administered 2022-02-05 (×2): 50 ug via INTRAVENOUS

## 2022-02-05 MED ORDER — BISACODYL 10 MG RE SUPP
10.0000 mg | Freq: Every day | RECTAL | Status: DC | PRN
Start: 2022-02-05 — End: 2022-02-07

## 2022-02-05 MED ORDER — DEXTROSE 10 % IV BOLUS
INTRAVENOUS | Status: DC | PRN
Start: 2022-02-05 — End: 2022-02-05
  Administered 2022-02-05: 125 mL via INTRAVENOUS

## 2022-02-05 MED ORDER — POTASSIUM CHLORIDE CRYS ER 20 MEQ PO TBCR
0.0000 meq | EXTENDED_RELEASE_TABLET | ORAL | Status: DC | PRN
Start: 2022-02-05 — End: 2022-02-07

## 2022-02-05 MED ORDER — SODIUM CHLORIDE 0.9 % IV SOLN
INTRAVENOUS | Status: DC
Start: 2022-02-05 — End: 2022-02-06

## 2022-02-05 MED ORDER — GABAPENTIN 300 MG PO CAPS
ORAL_CAPSULE | ORAL | Status: AC
Start: 2022-02-05 — End: 2022-02-05
  Administered 2022-02-05: 300 mg via ORAL
  Filled 2022-02-05: qty 1

## 2022-02-05 MED ORDER — ROCURONIUM BROMIDE 50 MG/5ML IV SOLN
INTRAVENOUS | Status: AC
Start: 2022-02-05 — End: ?
  Filled 2022-02-05: qty 5

## 2022-02-05 MED ORDER — DEXMEDETOMIDINE HCL IN NACL 200 MCG/50ML IV SOLN
INTRAVENOUS | Status: DC | PRN
Start: 2022-02-05 — End: 2022-02-05
  Administered 2022-02-05: .7 ug/kg/h via INTRAVENOUS

## 2022-02-05 MED ORDER — GABAPENTIN 100 MG PO CAPS
200.0000 mg | ORAL_CAPSULE | Freq: Three times a day (TID) | ORAL | Status: DC
Start: 2022-02-05 — End: 2022-02-06
  Administered 2022-02-05 – 2022-02-06 (×2): 200 mg via ORAL
  Filled 2022-02-05 (×2): qty 2

## 2022-02-05 MED ORDER — VANCOMYCIN HCL IN NACL 1.5-0.9 GM/500ML-% IV SOLN
1500.0000 mg | INTRAVENOUS | Status: AC
Start: 2022-02-05 — End: 2022-02-05
  Administered 2022-02-05: 1500 mg via INTRAVENOUS

## 2022-02-05 MED ORDER — PROPOFOL INFUSION 10 MG/ML
INTRAVENOUS | Status: DC | PRN
Start: 2022-02-05 — End: 2022-02-05
  Administered 2022-02-05: 30 ug/kg/min via INTRAVENOUS

## 2022-02-05 MED ORDER — LIDOCAINE HCL 1 % IJ SOLN
1.0000 mL | Freq: Once | INTRAMUSCULAR | Status: DC | PRN
Start: 2022-02-05 — End: 2022-02-07

## 2022-02-05 MED ORDER — PROPOFOL 10 MG/ML IV EMUL (WRAP)
INTRAVENOUS | Status: DC | PRN
Start: 2022-02-05 — End: 2022-02-05
  Administered 2022-02-05 (×3): 50 mg via INTRAVENOUS
  Administered 2022-02-05: 150 mg via INTRAVENOUS

## 2022-02-05 MED ORDER — FUROSEMIDE 10 MG/ML IJ SOLN
20.0000 mg | Freq: Once | INTRAMUSCULAR | Status: AC
Start: 2022-02-05 — End: 2022-02-05
  Administered 2022-02-05: 20 mg via INTRAVENOUS
  Filled 2022-02-05: qty 4

## 2022-02-05 MED ORDER — PHENYLEPHRINE HCL 10 MG/ML IV SOLN (WRAP)
Status: AC
Start: 2022-02-05 — End: ?
  Filled 2022-02-05: qty 1

## 2022-02-05 MED ORDER — TRAMADOL HCL 50 MG PO TABS
50.0000 mg | ORAL_TABLET | ORAL | Status: DC | PRN
Start: 2022-02-05 — End: 2022-02-07
  Administered 2022-02-06 – 2022-02-07 (×6): 50 mg via ORAL
  Filled 2022-02-05 (×6): qty 1

## 2022-02-05 MED ORDER — EPHEDRINE SULFATE 50 MG/ML IJ/IV SOLN (WRAP)
Status: DC | PRN
Start: 2022-02-05 — End: 2022-02-05
  Administered 2022-02-05 (×4): 2.5 mg via INTRAVENOUS
  Administered 2022-02-05 (×2): 10 mg via INTRAVENOUS
  Administered 2022-02-05: 2.5 mg via INTRAVENOUS
  Administered 2022-02-05 (×3): 5 mg via INTRAVENOUS
  Administered 2022-02-05: 2.5 mg via INTRAVENOUS

## 2022-02-05 MED ORDER — TRANEXAMIC ACID-NACL 1000-0.7 MG/100ML-% IV SOLN
INTRAVENOUS | Status: AC
Start: 2022-02-05 — End: ?
  Filled 2022-02-05: qty 200

## 2022-02-05 MED ORDER — LIDOCAINE HCL 1 % IJ SOLN
INTRAMUSCULAR | Status: AC
Start: 2022-02-05 — End: ?
  Filled 2022-02-05: qty 20

## 2022-02-05 MED ORDER — SODIUM PHOSPHATES 3 MMOLE/ML IV SOLN (WRAP)
35.0000 mmol | INTRAVENOUS | Status: DC | PRN
Start: 2022-02-05 — End: 2022-02-07

## 2022-02-05 MED ORDER — SODIUM BICARBONATE 8.4 % IV SOLN
INTRAVENOUS | Status: AC
Start: 2022-02-05 — End: ?
  Filled 2022-02-05: qty 150

## 2022-02-05 MED ORDER — LACTATED RINGERS IV BOLUS
250.0000 mL | INTRAVENOUS | Status: DC | PRN
Start: 2022-02-05 — End: 2022-02-07

## 2022-02-05 MED ORDER — ATORVASTATIN CALCIUM 10 MG PO TABS
40.0000 mg | ORAL_TABLET | Freq: Every evening | ORAL | Status: DC
Start: 2022-02-05 — End: 2022-02-05

## 2022-02-05 MED ORDER — SODIUM CHLORIDE BACTERIOSTATIC 0.9 % IJ SOLN
INTRAMUSCULAR | Status: AC
Start: 2022-02-05 — End: ?
  Filled 2022-02-05: qty 60

## 2022-02-05 MED ORDER — POTASSIUM CHLORIDE 20 MEQ/50ML IV SOLN
20.0000 meq | INTRAVENOUS | Status: DC | PRN
Start: 2022-02-05 — End: 2022-02-07

## 2022-02-05 MED ORDER — NEOSTIGMINE METHYLSULFATE 1 MG/ML IJ/IV SOLN (WRAP)
Status: DC | PRN
Start: 2022-02-05 — End: 2022-02-05
  Administered 2022-02-05: 5 mg via INTRAVENOUS

## 2022-02-05 MED ORDER — MAGNESIUM SULFATE IN D5W 1-5 GM/100ML-% IV SOLN
1.0000 g | INTRAVENOUS | Status: DC | PRN
Start: 2022-02-05 — End: 2022-02-07

## 2022-02-05 MED ORDER — ASPIRIN 81 MG PO TBEC
81.0000 mg | DELAYED_RELEASE_TABLET | Freq: Every day | ORAL | Status: DC
Start: 2022-02-06 — End: 2022-02-07
  Administered 2022-02-06 – 2022-02-07 (×2): 81 mg via ORAL
  Filled 2022-02-05 (×2): qty 1

## 2022-02-05 MED ORDER — PROTAMINE SULFATE 10 MG/ML IV SOLN
INTRAVENOUS | Status: AC
Start: 2022-02-05 — End: ?
  Filled 2022-02-05: qty 25

## 2022-02-05 MED ORDER — INSULIN REGULAR 100 UNITS IN 100 ML NS (PREMIX)
0.0000 [IU]/h | INTRAVENOUS | Status: DC
Start: 2022-02-05 — End: 2022-02-06

## 2022-02-05 MED ORDER — NEOSTIGMINE METHYLSULFATE 1 MG/ML IJ/IV SOLN (WRAP)
Status: AC
Start: 2022-02-05 — End: ?
  Filled 2022-02-05: qty 5

## 2022-02-05 MED ORDER — NOREPINEPHRINE-DEXTROSE 8-5 MG/250ML-% IV SOLN (IHS)
1.0000 ug/min | INTRAVENOUS | Status: DC | PRN
Start: 2022-02-05 — End: 2022-02-07

## 2022-02-05 MED ORDER — NITROGLYCERIN IN D5W 200-5 MCG/ML-% IV SOLN
INTRAVENOUS | Status: AC
Start: 2022-02-05 — End: ?
  Filled 2022-02-05: qty 250

## 2022-02-05 MED ORDER — ACETAMINOPHEN 325 MG PO TABS
650.0000 mg | ORAL_TABLET | ORAL | Status: DC | PRN
Start: 2022-02-10 — End: 2022-02-07

## 2022-02-05 MED ORDER — SODIUM PHOSPHATES 3 MMOLE/ML IV SOLN (WRAP)
25.0000 mmol | INTRAVENOUS | Status: DC | PRN
Start: 2022-02-05 — End: 2022-02-07

## 2022-02-05 MED ORDER — ONDANSETRON HCL 4 MG/2ML IJ SOLN
4.0000 mg | Freq: Three times a day (TID) | INTRAMUSCULAR | Status: DC | PRN
Start: 2022-02-05 — End: 2022-02-07

## 2022-02-05 MED ORDER — PROPOFOL INFUSION 10 MG/ML
10.0000 ug/kg/min | INTRAVENOUS | Status: DC
Start: 2022-02-05 — End: 2022-02-06
  Administered 2022-02-05: 20 ug/kg/min via INTRAVENOUS
  Filled 2022-02-05: qty 100

## 2022-02-05 MED ORDER — LIDOCAINE HCL (PF) 2 % IJ SOLN
INTRAMUSCULAR | Status: AC
Start: 2022-02-05 — End: ?
  Filled 2022-02-05: qty 5

## 2022-02-05 MED ORDER — SODIUM CHLORIDE 0.9 % IV MBP
0.0000 mg/h | INTRAVENOUS | Status: DC | PRN
Start: 2022-02-05 — End: 2022-02-07

## 2022-02-05 MED ORDER — LUBRIFRESH P.M. OP OINT
TOPICAL_OINTMENT | OPHTHALMIC | Status: AC
Start: 2022-02-05 — End: ?
  Filled 2022-02-05: qty 3.5

## 2022-02-05 MED ORDER — ONDANSETRON 4 MG PO TBDP
4.0000 mg | ORAL_TABLET | Freq: Three times a day (TID) | ORAL | Status: DC | PRN
Start: 2022-02-05 — End: 2022-02-07

## 2022-02-05 MED ORDER — ATORVASTATIN CALCIUM 40 MG PO TABS
40.0000 mg | ORAL_TABLET | Freq: Every evening | ORAL | Status: DC
Start: 2022-02-06 — End: 2022-02-07
  Administered 2022-02-06: 40 mg via ORAL
  Filled 2022-02-05: qty 4

## 2022-02-05 MED ORDER — LACTATED RINGERS IV SOLN
INTRAVENOUS | Status: DC | PRN
Start: 2022-02-05 — End: 2022-02-05

## 2022-02-05 MED ORDER — HEPARIN SODIUM (PORCINE) 1000 UNIT/ML IJ SOLN
INTRAMUSCULAR | Status: AC
Start: 2022-02-05 — End: ?
  Filled 2022-02-05: qty 70

## 2022-02-05 MED ORDER — CALCIUM CHLORIDE 10 % IV SOLN
INTRAVENOUS | Status: AC
Start: 2022-02-05 — End: ?
  Filled 2022-02-05: qty 10

## 2022-02-05 MED ORDER — INSULIN REGULAR 100 UNITS IN 100 ML NS (PREMIX)
INTRAVENOUS | Status: AC
Start: 2022-02-05 — End: ?
  Filled 2022-02-05: qty 100

## 2022-02-05 SURGICAL SUPPLY — 154 items
ADHESIVE BOND DRMBND 42CM LF MESH TPE EZ (Skin Closure) ×1
ADHESIVE SKIN CLOSURE DERMABOND MINI .36 (Suture) ×5
ADHESIVE SKIN CLOSURE DERMABOND MINI .36 ML LIQUID APPLICATOR (Suture) ×5 IMPLANT
ADHESIVE SKIN CLOSURE DERMABOND PRINEO (Skin Closure) ×10
ADHESIVE SKIN CLOSURE DERMABOND PRINEO LIQUID 2-OCTYL CYANOACRYLATE (Skin Closure) ×10 IMPLANT
ADHESIVE SKIN CLOSURE DERMABOND PRINEO MESH TAPE EASY REMOVE L42 CM (Skin Closure) ×5 IMPLANT
ADHESIVE SKNCLS 2 OCTYL CYNCRLT .36ML MN (Suture) ×1
ADHESIVE SKNCLS 2-OCTYL CYNCRLT DRMBND (Skin Closure) ×2
APPLICATOR CHLORAPREP 26 ML 70% ISOPROPYL ALCOHOL 2% CHLORHEXIDINE (Applicator) ×55 IMPLANT
APPLICATOR PRP 70% ISPRP 2% CHG 26ML (Applicator) ×66
APPLIER IN CLP SM LGCLP 9.3IN LF STRL 20 (Clips) ×2
APPLIER INTERNAL CLIP SMALL L9.3 IN 20 (Clips) ×10
APPLIER INTERNAL CLIP SMALL L9.3 IN 20 MULTIPLE OPEN LIGATE LIGACLIP (Clips) ×10 IMPLANT
BANDAGE CMPR CFLX NL 5YDX4IN LF STRL (Procedure Accessories) ×12
BANDAGE COFLEX NL COMPRESSION L5 YD X W4 IN COHESIVE SOFT FOAM TAN (Procedure Accessories) ×10 IMPLANT
BANDAGE GZE CTTN MED KRLX 3.6YDX6.4IN LF (Dressing) ×1
BANDAGE KERLIX MEDIUM GAUZE L3.6 YD X (Dressing) ×5 IMPLANT
BLADE OPHTHALMIC BLUE MINI-BLADE STRGHT GRINDLESS SHARP ALL ARND 2 BVL (Blade) ×5 IMPLANT
BLADE OPTH STRG MN BLD GRINDLESS SHRP (Blade) ×1
BLADE OPTH STRG MN BLD GRNDLSS SHRP BLUE (Blade) ×5
BLADE SRGCLPR W 37.2MM LF NS GP EXIST (Blade) ×1
BLADE SURGICAL CLIPPER WIDE W37.2 MM (Blade) ×5
BLADE SURGICAL CLIPPER WIDE W37.2 MM GENERAL PURPOSE EXIST HANDLE DROP (Blade) ×5 IMPLANT
CABLE CUTTING (Cable) ×6
CABLE SYSTEM NEEDLE CUTTING EDGE STERNAL SONGER 1.1MM  400689 (Cable) ×5 IMPLANT
CANNULA PERFUSION AORTIC OD20 FR L33.02 (Perfusion Supplies) ×5
CANNULA PERFUSION AORTIC OD20 FR L33.02 CM EOPA 3D DIFFUSION TIP (Perfusion Supplies) ×5 IMPLANT
CANNULA PERFUSION CORONARY SINUS OD15 FR L12.5 IN DLP SILICONE ADULT (Cannula) ×5 IMPLANT
CANNULA PRFSN EOPA 3D 20FR 33.02CM DFSN (Perfusion Supplies) ×1
CANNULA PRFSN SIL DLP 15FR 12.5IN RTRGD (Cannula) ×6
CATHETER GD NYL LCB CRV LNCHR 8FR 100CM (Catheter) ×1
CATHETER LCB CURVE OD8 FR LARGE LUMEN (Catheter) ×5
CATHETER OD8 FR L100 CM LCB CURVE LAUNCHER GUIDING LARGE LUMEN (Catheter) ×5 IMPLANT
CATHETER URETHRAL OD18 FR 2 LARGE OPPOSE (Catheter Urine) ×15
CATHETER URETHRAL OD18 FR 2 LARGE OPPOSE EYE FUNNEL END SMOOTH TIP (Catheter Urine) ×15 IMPLANT
CATHETER URETHRAL OD18 FR 2 STAGGER (Catheter Urine) ×5
CATHETER URETHRAL OD18 FR 2 STAGGER DRAINAGE EYE ROUND CLOSED TIP (Catheter Urine) ×5 IMPLANT
CATHETER URTH PVC DOV RBNL 18FR 16IN LF (Catheter Urine) ×1
CATHETER URTH RBR 18FR 16IN LTX STRL 2 (Catheter Urine) ×3
CLIP INTERNAL MEDIUM CHEVRON 24 (Clips) ×5 IMPLANT
CLIP INTNL TI MED CHEVRON WECK HRZN 24 (Clips) ×1
CLIP SRG 6MM ATRM OCL .75 FRC SPRG SFT (Clips) ×1
CLIP SURGICAL L6 MM ATRAUMATIC OCCLUSION (Clips) ×5
CLIP SURGICAL L6 MM ATRAUMATIC OCCLUSION 3/4 FORCE SPRING SOFT (Clips) ×5 IMPLANT
CONNECTOR PERFUSION 3/8IN Y TMP STERILE (Connector) ×5 IMPLANT
CONNECTOR PERFUSION REDUCING .25IN 3/8IN STRAIGHT TMP STERILE (Connector) ×5 IMPLANT
CONNECTOR PRFSN STRG TMP .25IN 3/8IN (Connector) ×6
CONNECTOR PRFSN Y TMP 3/8IN STRL (Connector) ×6
COVER PROBE SOFT FLEX L96 IN X W6 IN GEL (Procedure Accessories) ×5
COVER PROBE SOFT FLEX L96 IN X W6 IN GEL ULTRASOUND POLYURETHANE (Procedure Accessories) ×5 IMPLANT
CUFF TOURNIQUET CYLINDRICAL L18 IN X W4 IN 2 PORT BLADDER QUICK (Procedure Accessories) ×5 IMPLANT
CUFF TRNQT CYL CLR CUF 18X4IN LF STRL 2 (Procedure Accessories) ×6
DERMABOND PRINEO SKIN CLOSURE SYSTEM (Skin Closure) ×5
DRAIN INCS SIL RND 24FR 5/16IN LF STRL (Drain) ×1
DRAIN OD24 FR RADIOPAQUE 4 FREE FLOW (Drain) ×5
DRAIN OD24 FR RADIOPAQUE 4 FREE FLOW CHANNEL FULL FLUTE BLAKE L5/16 IN (Drain) ×5 IMPLANT
DRAPE ORTHOPEDIC U SHAPE ABSORBENT ADHESIVE STRIP 15X26IN 89731 (Drape) ×5 IMPLANT
DRAPE PROBE SOFT 6X96 (Procedure Accessories) ×1
DRESSING SCR CHG TGDRM 4.5X3.5IN LF STRL (Dressing) ×1
DRESSING SECUREMENT TEGADERM L4 1/2 IN X (Dressing) ×5
DRESSING TRANSPARENT L4 1/2 IN X W3 1/2 IN CHLORHEXIDINE GLUCONATE (Dressing) ×5 IMPLANT
DRESSING WND ACRL PLSTR PRMPR 4INX3 1/8 (Dressing) ×24 IMPLANT
DRESSING WND ACRL PLSTR PRMPR 8X4IN LF (Dressing) ×12 IMPLANT
DRESSING WND GZE KRLX 4.125YDX4.5IN LF (Dressing) ×1
DRESSING WOUND KERLIX L4.125 YD X W4.5 (Dressing) ×5
DRESSING WOUND KERLIX L4.125 YD X W4.5 IN 6 PLY ANTIMICROBIAL (Dressing) ×5 IMPLANT
ELECTRODE ELECTROSURGICAL BLADE L6.5 IN (Cautery) ×5
ELECTRODE ELECTROSURGICAL BLADE L6.5 IN EDGE (Cautery) ×5 IMPLANT
ELECTRODE ELECTROSURGICAL BLADE PENCIL L10 FT OD3/8 IN PLUMEPEN ELITE (Other) ×5 IMPLANT
ELECTRODE ESURG BLDE EDG 6.5IN LF (Cautery) ×1
ELECTRODE ESURG BLDE PNCL PLUMEPEN ELT (Other) ×6
GLOVE SRG PLISPRN 8 BGL PI ULTRATOUCH (Glove) ×2
GLOVE SURGICAL 8 BIOGEL PI ULTRATOUCH (Glove) ×10
GLOVE SURGICAL 8 BIOGEL PI ULTRATOUCH POWDER FREE ROUGH BEAD CUFF (Glove) ×10 IMPLANT
GOWN SRG XL SMARTGOWN LF STRL LVL 4 (Gown) ×2
GOWN SURGICAL XL SMARTGOWN LEVEL 4 (Gown) ×10
GOWN SURGICAL XL SMARTGOWN LEVEL 4 BREATHABLE (Gown) ×10 IMPLANT
KIT ROOM TURNOVER (Kits) ×6
KIT ROOM TURNOVER ~~LOC~~ INVA02 (Kits) ×5 IMPLANT
KIT SRGBSN LF STRL CVOR DISP (Other) ×1
KIT SURGICAL BASIN CVOR (Other) ×5
KIT SURGICAL BASIN CVOR MEDLINE INDUSTRIES, INC. (Other) ×5 IMPLANT
KIT TISS CLSR 10ML DPJT TSSL LF STRL (Hemostat) ×6
KIT TISSUE CLOSURE FREEZE DRIED NONPYROGENIC DUPLOJECT TISSEEL 10 ML (Hemostat) ×5 IMPLANT
LINE MONITOR PRSRE M/F 8FT CLR (IV Supply) ×1
PACK SRG LF STRL RADL ART DISP (Pack) ×6
PUNCH AOR HLW LOPRFL 4.5MM LF STRL 2 CUT (Procedure Accessories) ×1
PUNCH AORTIC OD4.5 MM 2 CUT ROTATE (Procedure Accessories) ×5
PUNCH AORTIC OD4.5 MM 2 CUT ROTATE SYRINGE STYLE HOLLOW LOW PROFILE (Procedure Accessories) ×5 IMPLANT
SET 3 LEAD ADAPTER Y ADAPTER LINE (Adapter) ×5
SET 3 LEAD ADAPTER Y ADAPTER LINE INTRODUCER CATHETER CARDIOPLEGIA (Adapter) ×5 IMPLANT
SET CRDPLG 3 LD ADPR Y ADPR LN INTRO (Adapter) ×1
SOLUTION IRR 0.9% NACL 1000ML LF STRL (Irrigation Solutions) ×4
SOLUTION IRRIGATION 0.9% SODIUM CHLORIDE (Irrigation Solutions) ×20
SOLUTION IRRIGATION 0.9% SODIUM CHLORIDE 1000 ML PLASTIC POUR BOTTLE (Irrigation Solutions) ×20 IMPLANT
SPNG ABSORBABLE GELATIN (Hemostat) ×2
SPONGE ABSORBABLE L12.5 CM X W8 CM (Hemostat) ×10
SPONGE ABSORBABLE L12.5 CM X W8 CM PORCINE GELATIN SURGIFOAM THK10 MM (Hemostat) ×10 IMPLANT
SPONGE LAP CTTN 18X18IN LF STRL 4 PLY (Sponge) ×2
SPONGE LAPAROTOMY L18 IN X W18 IN 4 PLY (Sponge) ×10
SPONGE LAPAROTOMY L18 IN X W18 IN 4 PLY RADIOPAQUE PYRONEMA FREE HIGH (Sponge) ×10 IMPLANT
STRAP STRCHR SONTARA PLSTR 60X3IN LF NS (Procedure Accessories) ×6
STRAP STRETCHER L60 IN X W3 IN OR TABLE HOOK SONTARA POLYESTER WHITE (Procedure Accessories) ×5 IMPLANT
SUTURE NABSB 4-0 SH PRLN 36IN 2 ARM MFL (Suture) ×11
SUTURE NABSB 5-0 RB1 PRLN 36IN 2 ARM MFL (Suture) ×2
SUTURE NABSB 6-0 BV-1 PRLN 18IN 2 ARM (Suture)
SUTURE NABSB 6-0 RB-2 PRLN 30IN 2 ARM (Suture) ×4
SUTURE NABSB 7-0 BV-1 PRLN 24IN 2 ARM (Suture) ×3
SUTURE NABSB 7-0 BV175-6 PRLN MTPS 24IN (Suture) ×1
SUTURE NABSB SLK 2-0 CT1 PRMHND 18IN CR (Suture) ×1
SUTURE NABSB SLK 2-0 FSL PRMHND 30IN BRD (Suture) ×4
SUTURE PROLENE BLUE 4-0 SH L36 IN 2 ARM (Suture) ×55
SUTURE PROLENE BLUE 4-0 SH L36 IN 2 ARM MONOFILAMENT NONABSORBABLE (Suture) ×55 IMPLANT
SUTURE PROLENE BLUE 5-0 RB-1 L36 IN 2 (Suture) ×10
SUTURE PROLENE BLUE 5-0 RB-1 L36 IN 2 ARM MONOFILAMENT NONABSORBABLE (Suture) ×10 IMPLANT
SUTURE PROLENE BLUE 6-0 BV-1 L18 IN 2 (Suture)
SUTURE PROLENE BLUE 6-0 BV-1 L18 IN 2 ARM MONOFILAMENT NONABSORBABLE (Suture) IMPLANT
SUTURE PROLENE BLUE 6-0 RB-2 L30 IN 2 (Suture) ×20
SUTURE PROLENE BLUE 6-0 RB-2 L30 IN 2 ARM MONOFILAMENT NONABSORBABLE (Suture) ×20 IMPLANT
SUTURE PROLENE BLUE 7-0 BV-1 L24 IN 2 (Suture) ×15
SUTURE PROLENE BLUE 7-0 BV-1 L24 IN 2 ARM MONOFILAMENT NONABSORBABLE (Suture) ×15 IMPLANT
SUTURE PROLENE BLUE 7-0 BV175-6 L24 IN 2 (Suture) ×5
SUTURE PROLENE BLUE 7-0 BV175-6 L24 IN 2 ARM MONOFILAMENT (Suture) ×5 IMPLANT
SUTURE SILK PERMA HAND BLACK 2-0 CT-1 (Suture) ×5
SUTURE SILK PERMA HAND BLACK 2-0 FSL L30 (Suture) ×20
SUTURE SILK PERMA HAND BLACK 2-0 FSL L30 IN BRAID NONABSORBABLE (Suture) ×20 IMPLANT
SUTURE SILK PERMA HAND BLCK 2-0 CT-1 L18IN CNTRL BRD 8 STRAND NNBSRBBL (Suture) ×5 IMPLANT
SYSTEM CONE ENDOSCOPE HANDPIECE CANNULA (Kits) ×10
SYSTEM CONE ENDOSCOPE HANDPIECE CANNULA SEAL VASOVIEW HEMOPRO 2 RADIUS (Kits) ×10 IMPLANT
SYSTEM VSL HRVT CONE VSVW HEMOPRO 2 ESCP (Kits) ×2
SYSTEM WND IRR 0.05% CHG IRRISEPT LF (Solution) ×1
SYSTEM WOUND IRRIGATION DEBRIDEMENT (Solution) ×5
SYSTEM WOUND IRRIGATION DEBRIDEMENT CLEANSING IRRISEPT 0.05% (Solution) ×5 IMPLANT
TAPE UMB PLSTR HMSH PL FNS 30X3/16IN (Procedure Accessories) ×1
TAPE UMBILICAL L30 IN X W3/16 IN TIGHT (Procedure Accessories) ×5
TAPE UMBILICAL L30 IN X W3/16 IN TIGHT BRAID HEMASHIELD PLATINUM (Procedure Accessories) ×5 IMPLANT
TOWEL L27 IN X W17 IN COTTON PREWASH (Other) ×5
TOWEL L27 IN X W17 IN COTTON PREWASH DELINT HIGH ABSORBENT BLUE (Other) ×5 IMPLANT
TOWEL N-ABSORB UTIL STRL 17X26 (Drape) ×6
TOWEL SRG CTTN 27X17IN LF STRL PREWASH (Other) ×1
TRAY CATH PVP SIL 10ML 2.5L 14FR LTX 1 (Tray) ×1
TRAY CATHETER 1 LAYER FOLEY URINE METER CLOSED SYSTEM PVP SILICONE (Tray) ×5 IMPLANT
TRAY CATHETER MEDLINE 1 LAYER FOLEY (Tray) ×5
TRAY SRG CABG (Pack) ×6
TRAY SURGICAL CABG (Pack) ×5 IMPLANT
TRAY SURGICAL RADIAL ARTERY (Pack) ×5 IMPLANT
TUBING PRESSURE MONITOR L96 IN LINE FIX (IV Supply) ×5
TUBING PRESSURE MONITOR L96 IN LINE FIX MALE TO FEMALE LUER CONNECTOR (IV Supply) ×5 IMPLANT
TUBING PRSS MNTR PVC 6IN LF MNTR LN 4W (Other) ×6 IMPLANT
WATER STERILE PLASTIC POUR BOTTLE 1000 (Irrigation Solutions) ×10
WATER STERILE PLASTIC POUR BOTTLE 1000 ML (Irrigation Solutions) ×10 IMPLANT
WATER STRL 1000ML LF PLS PR BTL (Irrigation Solutions) ×2
WIPE PERSONAL L7.9 IN X W7.9 IN POST INSERTION FOLEY SURESTEP PULP (Patient Supply) ×10 IMPLANT
WIPE PRSNL PULP PP SRSTP 7.9X7.9IN LF NS (Patient Supply) ×12

## 2022-02-05 NOTE — Plan of Care (Addendum)
Patient: Erik [name], Erik [name]   Date: 02/05/2022  LOS: 0  Attending: Austin Miles, MD  APP: Dionicio Stall care of patient at: 02/05/22 @1155am     Review of Systems    Neuro/Activity: able to follow commands, moves all extremities; agitated at times; Precedex dose increased throughout shift and Propofol added for agitation;    Reportedly drinks upwards of 24beers/day - last drink unknown;     CV: pacer AAI rate increased from 80 to 90 @ 1700;  underlying - NSR 69; on and off Levo several times this shift;        Respiratory: remains intubated;  Peep increased to 10 @ 1700;  lungs clear/diminished throughout; chest tubes draining sanguinous d/c up to 140/hr;  given platelets and cryo x2 this shift;      GI: abd soft, bs absent;  OGT @ 50;  placed to low intermittent suction per Courtney at end of shift for gastric bubble;     GU: foley draining clear amber urine;     Skin: see flow sheets    ID: [email or web address]    Dispo/Plan: continue ICU, monitor for bleeding;    Problem: Moderate/High Fall Risk Score >5  Goal: Patient will remain free of falls  Outcome: Progressing  Flowsheets (Taken 02/05/2022 1200)  High (Greater than 13):   HIGH-Visual cue at entrance to patient's room   HIGH-Bed alarm on at all times while patient in bed   HIGH-Apply yellow "Fall Risk" arm band     Problem: Post-op Phase - Cardiac Surgery  Goal: Effective breathing pattern is maintained  Outcome: Progressing  Flowsheets (Taken 02/05/2022 1951)  Effective breathing pattern is maintained:   Position patient for maximum ventilatory efficiency with HOB to a minimum of 30 degrees when hemodynamically stable   Maintain SpO2 level per LIP order   Reinforce use of ordered respiratory interventions (i.e. CPAP, BiPAP, Incentive Spirometer, Acapella)   Monitor for sleep apnea   Monitor for medication-induced respiratory depression  Goal: Patient will remain free from post-op complications  Outcome: Progressing  Flowsheets (Taken 02/05/2022 1951)  Patient will remain free  from post-op complications:   Maintain sternal precautions as described in patient education   Maintain pericare/Foley care per protocol   Discontinue Foley within 24 hours unless otherwise ordered [facility])   Monitor/assess blood glucose per LIP orders   VTE prevention: Administer anticoagulants and/or apply anti-embolism stockings/devices as ordered   Assess dressing and reinforce or change per LIP order   Assess surgical incision/wound site and treat per LIP order

## 2022-02-05 NOTE — Progress Notes (Signed)
Patient: Erik [name], Erik [name]  Date: 02/05/2022  Attending: Austin Miles, MD      Is Patient Candidate for Fast Track: No      If Candidate for Fast Track (enter N/A if not a fast track candidate)  Extubated within 6 hours? Not Applicable      If patient was NOT extubated within 6 hours, please explain why: N/A

## 2022-02-05 NOTE — OR Nursing (Signed)
REPORT CALLED TO TRACEY, RN IN CVICU.

## 2022-02-05 NOTE — Brief Op Note (Signed)
BRIEF OP NOTE    Date Time: 02/05/22 11:48 AM    Patient Name:   Erik [name], Erik [name] (MRN: [id number])    Date of Operation:   02/05/2022     Providers Performing:   Surgeon(s) and Role:  Panel 1:     Austin Miles, MD - Primary  Panel 2:     * Barbara Cower, MD - Primary    Surgical First Assistant(s):   First Assistant: Raymond Gurney, RN    Operative Procedure:   CABG x 3 (LIMA-distLAD, SVG-PDA, Rad-OM1)  PFO closure  LUE ERH  LLE EVH    Preoperative Diagnosis:   CAD  COPD    Postoperative Diagnosis:   same    Findings:   LAD, PDA - 1.16mm  OM1 - 1.32mm thick  Conduits - good  No residual PFO or septal aneurysm post-op    Anesthesia:   General    Estimated Blood Loss:    50ml    Implants:   * No implants in log *        Drains:   Drains: 3 x 24Fr Blakes. Lt in Lt chest and other 2 mediastinal        Intentionally Retained Foreign Objects/Wound Packing:   Intentionally Retained Foreign Objects     Active IRFO     None              Specimens:   * No specimens in log *       Complications:   None     Signed by: Austin Miles, MD                                                                           [facility] HEART OR

## 2022-02-05 NOTE — Progress Notes (Signed)
Cardiac Surgery Progress Note  Date/Time:  02/06/22 3:56 AM   Patient Name:  Erik [name] Age: 46 y.o.   Room:  FI201/FI201-01     Operative Profile:   POD#: 1    02/05/22:  Elective, CABG x 3 (LIMA-LAD, SVG-PDA, L Radial-OM1), PFO closure             Surgeon: Austin Miles, MD             From OR: Gtts: Levo @ 1 ; Products: NONE ; Rhythm: Sinus Rhythm 60s    LVEF: 50%%, RVEF NORMAL  Cardiologist: Trellis Moment, MD, [facility]    Synopsis   Indication for Surgery: Progressive angina, multivessel CAD    Preop Course: 46 y/o male w/ PmHx of HTN, HLD, CAD, hyponatremia, smoker, Heavy ETOH use, depression, TBI, who was recently evaluated in Dec 22' w/ c/o of worsening exertional angina/CP, LHC 01/08/22 revealed 3 vessel disease CAD, w/ 100% prox CTO of RCA. On 02/05/22 he was brought to the OR for elective CABG.    PMH:  has a past medical history of Chest pain (12/10/2021), Coronary artery disease involving native coronary artery of native heart without angina pectoris (02/04/2022), COVID (09/2021), Hearing loss in Left ear, History of echocardiogram (10/05/2021), History of nuclear stress test: exercise SPECT abn (11/22/2021), Hyperlipidemia, Hyponatremia, and TBI (traumatic brain injury) (1999).    Daily Summary:  4/26. SDA--> OR, chest tubes oozing, required 2 PLT, 2 Cryoprecipitate  4/27    Plan:   Remains in the ICU due to for hemodynamic monitoring   -  Monitor chest tubes and H/H    Standard Events:     -    Extubated  POD #0   -    Transferred to SDU POD # ___   -    Removed CTs POD # ___   -    Removed PW POD # ___ by *ing             System Assessment of Active Issues     Neurological?:  Pain management: Pain is well controlled. on standard regimen   Chronic Depression   Hx of TBI       Cardiovascular:  CAD/Multivessel, s/p CABG  HTN  HLD    Pulmonary:  No issues     Gastrointestinal:  No acute issues     Renal/GU:  Volume overload   Moderate hyponatremia      Infectious  Disease:  Clinically, no acute issue      Hematological:  Acute Blood Loss Anemia, and Coagulopathy as expected from surgery   Thrombocytopenia       Endocrinological:  Non DM, Stress Hyperglycemia controlled on insulin gtt per protocol  HgbA1C: 5.1   Lab Results   Component Value Date    HGBA1C 5.1 01/28/2022            Subjective:   No complaints     Physical Exam:   BP 101/72   Pulse 89   Temp 98.5 F (36.9 C) (Oral)   Resp 21   Ht 1.88 m (6\' 2" )   Wt 86.6 kg (191 lb)   SpO2 100%   BMI 24.52 kg/m     Physical Exam:  Neuro: Intact, MAE's, follows commands  Cardiac: normal S1/S2, no murmur  Lungs: diminished   Abdomen: soft, NT/ND, active BS  Extremities: warm and well perfused, palpable pulses  Wounds: C/D/I        Specific Labs:  CBC:  Recent Labs     02/06/22  0023   WBC 6.07   Hgb 9.1*   Hematocrit 24.8*   Platelets 156     BMP:  Recent Labs     02/06/22  0023   Sodium 128*   Potassium 4.6   Chloride 99   CO2 24   BUN 9.0   Creatinine 0.8   Glucose 130*   Magnesium 2.0   Calcium 8.6         Meds:      sodium chloride 85 mL/hr at 02/06/22 0300    dexmedeTOMIDine Stopped (02/05/22 2245)    insulin regular      niCARdipine      nitroglycerin      norepinephrine Stopped (02/05/22 1520)    propofol Stopped (02/05/22 2050)        Current Facility-Administered Medications   Medication Dose Route Frequency    acetaminophen  1,000 mg Oral 4 times per day    amiodarone  200 mg Oral Q12H [facility]    aspirin EC  81 mg Oral Daily    atorvastatin  40 mg Oral QHS    cefuroxime  1.5 g Intravenous Q8H    chlorhexidine  15 mL Mouth/Throat Q12H [facility]    famotidine  20 mg Oral Q12H [facility]    gabapentin  200 mg Oral Q8H [facility]    metoprolol tartrate  12.5 mg Oral Q12H [facility]    mupirocin   Nasal Q12H [facility]    polyethylene glycol  17 g Oral Daily    senna-docusate  2 tablet Oral Q12H [facility]           Signed by: Loni Muse, PA                      Cardiac Surgery

## 2022-02-05 NOTE — Transfer of Care (Signed)
Anesthesia Transfer of Care Note    Patient: Erik [name]    Procedures performed: Procedure(s):  CORONARY ARTERY BYPASS X 3 ; LIMA  ENDOSCOPIC,VEIN HARVEST  ENDOSCOPY, HARVEST RADIAL ARTERY  ECHOCARDIOGRAM, TRANSESOPHAGEAL  CLOSURE, PATENT FORAMEN OVALE (OPEN)    Anesthesia type: General ETT    Patient location:ICU    Last vitals:   Vitals:    02/05/22 1159   BP: 101/54   Pulse: 79   Resp: 12   Temp:    SpO2: 100%       Post pain: Patient not complaining of pain, continue current therapy      Mental Status:sedated    Respiratory Function: intubated    Cardiovascular: stable    Nausea/Vomiting: patient not complaining of nausea or vomiting    Hydration Status: adequate    Post assessment: no apparent anesthetic complications, no reportable events and no evidence of recall    Signed by: Rita Ohara, CRNA  02/05/22 11:59 AM

## 2022-02-05 NOTE — Anesthesia Procedure Notes (Addendum)
FX TEE        Performed by: Barbara Cower, MD    Authorized by: Barbara Cower, MD      Procedure Info    Indication:  Congenital Heart Disease Repair, Valve Function and Other(comment)  Surgery Class:  CABG and Other (PFO repair)  Examiner:  Barbara Cower, MD  TEE CPT Codes:  [phone] Adult TEE with interpretation, 93320 Doppler ECHO, 93325 Doppler ECHO (Color Flow) and [phone] interpretation congenital anomalies  History of Esophageal Stricture?: No    History of Radiation Therapy?: No    Hemoptysis?: No    Probe Placement:  Bite Block used and Atraumatic  TEE Attestation:  DIAGNOSTIC This TEE is for diagnostic purposes.  Pelase refer to written report for details regarding the exam    Aorta    Asc Aortic Disease:  Difuse  Asc Aortic Atheroma:  Grade 1  Desc Aortic Disease:  Diffuse  Desc Aortic Atheroma:  Grade 1  Aortic Dissection:  None  Coronary Dissection:  None    Pericardium    Pericardial Effusion:  None  Pericardial Thickening:  None    Left Ventricle    LViD Exceeds 9cm: No    LVH-Wall exceeds 1cm: No    Ejection Fraction:  45-55%  LV Thrombus:  None  LV Septum:  Normal  Pre-surgery RWM Anterior wall:  Mild HK  Pre-surgery RWM Lateral Wall:  Mild HK  Pre-surgery RWM Septum:  Mild HK  Pre-surgery RWM Inferior Wall:  Mild HK  Pre-surgery RWM Apex:  Mild HK  Post-surgery RWM Anterior wall:  Mild HK  Post-surgery RWM Lateral Wall:  Mild HK  Post-surgery RWM Septum:  Mild HK  Post-surgery RWM Inferior Wall:  Mild HK  Post-surgery RWM Apex:  Mild HK    Left Atrium    Left Atrium Enlargement:  None  Pulmonary Vein Flow:  None  ASD:  PFO (aneurysm of atrial septum, post PFO closure no flow noted across the atrial septum)  LA Appendage:  Normal    Right Ventricle    RV Size:  Normal  RV Function:  Normal  RV Thrombus:  Normal    Right Atrium    Coronary Sinus Catheter:  Seen in CS    Aortic Valve    Native AV Type:  Bicuspid  AV Annulus (mm):  29  AV Asc Aorta (mm):  34  AS Grade:  None  AR Grade:   None    Mitral Valve    Native Mitral Valve Type:  Normal  Native Mitral Valve:  Central Regurg    MS Grade:  None  MR Grade:  1+    Tricuspid Valve    Native Tricuspid Valve Type:  Normal  Native Tricuspid Valve:  Central Regurg  TR Grade:  1+    Intracardiac Masses

## 2022-02-05 NOTE — Discharge Summary (Signed)
CARDIAC SURGERY DISCHARGE SUMMARY  Patient Name:  Erik [name] Age: 46 y.o.   Room:  FI264/FI264-01     Date of Admission:   02/05/2022    Date of Discharge:   02/10/2022    Operative Profile:   02/05/22:  Elective, CABG x 3 (LIMA-LAD, SVG-PDA, L Radial-OM1), PFO closure             Surgeon: Austin Miles, MD             From OR: Gtts: Levo @ 1 ; Products: NONE ; Rhythm: Sinus Rhythm 60s     LVEF: 50%%, RVEF NORMAL  Cardiologist: Trellis Moment, MD, [facility]    Synopsis   Indication for Surgery: Progressive angina, multivessel CAD     Preop Course: 46 y/o male w/ PmHx of HTN, HLD, CAD, hyponatremia, smoker, Heavy ETOH use, depression, TBI, who was recently evaluated in Dec 22' w/ c/o of worsening exertional angina/CP, LHC 01/08/22 revealed 3 vessel disease, w/ 100% prox CTO of RCA. On 02/05/22 he was brought to the OR for elective CABG.     PMH:  has a past medical history of Chest pain (12/10/2021), Coronary artery disease involving native coronary artery of native heart without angina pectoris (02/04/2022), COVID (09/2021), Hearing loss in Left ear, History of echocardiogram (10/05/2021), History of nuclear stress test: exercise SPECT abn (11/22/2021), Hyperlipidemia, Hyponatremia, and TBI (traumatic brain injury) (1999).     Daily Summary:  4/26. SDA--> OR, chest tubes oozing, required 4 PLT, 4 Cryoprecipitate, extubated    4/27: Orthostatic, d/c BB, started on CIWA, zoloft  4/28: Transferred to SDU. Start Diuresis w/ lasix . Foley replaced for retention, on Flomax  4/29: No further orthostasis, give lasix 20 IV x1. Hyponatremic, Na 129->122->126. 500 cc FWR. PW grounded  4/30: CT and PW removed w/o incident, Rankin CIWA protocol  5/1: Na up to 134, remains in Sinus Rhythm 70-80s, BP 118/60, breathing comfortably on RA. CXR 2vw post chest tube removal shows no obvious pneumothorax.  Cleared for discharge home    Standard Events:     -    Extubated  POD #0    -    Transferred to SDU POD # 2   -    Removed CTs POD # 4   -    Removed PW (2A, 2V) POD # 4 by pulling          Postop Hospital Course and Diagnoses     Post-op Day Diagnosis and Treatment during this Hospital Course   POD 0-5 CAD/Multivessel, s/p CABG, stable/improving, continue ASA, Statin, Plavix  HTN, controlled no medications, resume low dose BB   Orthostatic Hypotension- Resolved   HLD, Continue statin  Mild COPD/Current Smoker/20 pack year smoker- stable  Volume overload-   Moderate hyponatremia, acute on chronic, improved     The patient tolerated the procedure and was transferred to the CVICU intubated and sedated.     Once sedation was weaned off, the patient was neurologically Intact and then was extubated to RA. Inotropic and vasoactive medications were weaned off as cardiac index and hemodynamics tolerated, which did not include a prolonged wean. The CVICU postoperative course was not complicated. Please see details above in the daily summary. The patient was transferred out of the CVICU to the step down unit .    While on the stepdown unit, the patient remained hemodynamically stable without episodes of Atrial Fibrillation and was discharged in sinus rhythm, average heart rate 75bpm prior to discharge.  Prophylactic Amiodarone was tolerated per protocol. Antihypertensive medications were resumed and increased as the heart rate and blood pressure allowed, which included a betablocker. Chest tubes and pacing wires were removed without issue. With pulmonary hygiene, including deep breathing, coughing, and incentive spirometry, the patient was weaned off supplemental O2 to RA. Diet was slowly advanced and tolerated. The patient had multiple bowel movements prior to discharge. Given postsurgical volume overload, the patient was aggressively diuresed with close monitoring of electrolytes and kidney function. Potassium and magnesium supplementation was provided as needed. SQ Lovenox was utilized for DVT  prophylaxis along with sequential compression devices. Blood sugars were closely monitored, treated as needed, and stable prior to discharge.     Prior to discharge, the patient was ambulating without difficulty. Physical and Occupational therapy were consulted and assessed and treated throughout the hospital course. Recommendations for discharge were made to home. These recommendations were followed and the patient was agreeable for discharge on POD 5.     The family was updated throughout the hospitalization and understood the discharge plan. All questions were answered and the patient verbalized understanding of the discharge plan. They were instructed to call the surgeon's office with any questions or concerns including but not limited to fever, chills, shortness of breath, change in chest pain,  change with their incision.    Physical Exam:   Neuro: Intact, MAE's, follows commands  Cardiac: normal S1/S2, no murmur  Lungs: diminished   Abdomen: soft, NT/ND, active BS  Extremities: warm and well perfused, palpable pulses. Trace lower extremity edema  Wounds: Sternum stable, incision C/D/I     Consultations:   N/A    Discharge Profile:     Vitals:    02/10/22 0829   BP: (P) 126/73   Pulse: (P) 81   Resp: (P) 18   Temp: (P) 98 F (36.7 C)   SpO2: (P) 98%     Results       Procedure Component Value Units Date/Time    CBC and differential [[id number]]  (Abnormal) Collected: 02/10/22 0344    Specimen: Blood Updated: 02/10/22 0549     WBC 5.36 x10 3/uL      Hgb 7.7 g/dL      Hematocrit 24.4 %      Platelets 198 x10 3/uL      RBC 2.38 x10 6/uL      MCV 91.2 fL      MCH 32.4 pg      MCHC 35.5 g/dL      RDW 13 %      MPV 9.2 fL      Instrument Absolute Neutrophil Count 3.31 x10 3/uL      Neutrophils 61.7 %      Lymphocytes Automated 28.4 %      Monocytes 8.4 %      Eosinophils Automated 1.1 %      Basophils Automated 0.2 %      Immature Granulocytes 0.2 %      Nucleated RBC 0.0 /100 WBC      Neutrophils Absolute 3.31 x10  3/uL      Lymphocytes Absolute Automated 1.52 x10 3/uL      Monocytes Absolute Automated 0.45 x10 3/uL      Eosinophils Absolute Automated 0.06 x10 3/uL      Basophils Absolute Automated 0.01 x10 3/uL      Immature Granulocytes Absolute 0.01 x10 3/uL      Absolute NRBC 0.00 x10 3/uL     Magnesium [[id number]] Collected: 02/10/22  0344    Specimen: Blood Updated: 02/10/22 0511     Magnesium 2.0 mg/dL     GFR [[id number]] Collected: 02/10/22 0344     Updated: 02/10/22 0511     EGFR >60.0       Basic Metabolic Panel [[id number]]  (Abnormal) Collected: 02/10/22 0344    Specimen: Blood Updated: 02/10/22 0511     Glucose 96 mg/dL      BUN 7.0 mg/dL      Creatinine 0.9 mg/dL      Calcium 8.8 mg/dL      Sodium 782 mEq/L      Potassium 4.5 mEq/L      Chloride 100 mEq/L      CO2 27 mEq/L      Anion Gap 7.0    Glucose Whole Blood - POCT [[id number]] Collected: 02/09/22 1127     Updated: 02/09/22 1153     Whole Blood Glucose POCT 85 mg/dL             Discharge Medications:        Medication List        START taking these medications      acetaminophen 325 MG tablet  Commonly known as: TYLENOL  Take 2 tablets (650 mg) by mouth every 6 (six) hours as needed for Pain     amiodarone 200 MG tablet  Commonly known as: PACERONE  Take 1 tablet (200 mg) by mouth daily for 10 days This is a maintenance dose.     aspirin 81 MG EC tablet  Take 1 tablet (81 mg) by mouth daily  Replaces: aspirin 81 MG chewable tablet     clopidogrel 75 mg tablet  Commonly known as: PLAVIX  Take 1 tablet (75 mg) by mouth daily     furosemide 20 MG tablet  Commonly known as: LASIX  Take 1 tablet (20 mg) by mouth daily for 5 days     gabapentin 100 MG capsule  Commonly known as: NEURONTIN  Take 2 capsules (200 mg) by mouth every 8 (eight) hours for 5 days     nicotine 14 MG/24HR  Commonly known as: NICODERM CQ  Place 1 patch onto the skin daily for 14 days     senna-docusate 8.6-50 MG per tablet  Commonly known as: PERICOLACE  Take 2 tablets by mouth nightly as  needed for Constipation     tamsulosin 0.4 MG Caps  Commonly known as: FLOMAX  Take 1 capsule (0.4 mg) by mouth Daily after dinner     traMADol 50 MG tablet  Commonly known as: ULTRAM  Take 1 tablet (50 mg) by mouth every 6 (six) hours as needed for Pain            CHANGE how you take these medications      metoprolol succinate XL 25 MG 24 hr tablet  Commonly known as: TOPROL-XL  Take 1 tablet (25 mg) by mouth daily  What changed: when to take this            CONTINUE taking these medications      atorvastatin 40 MG tablet  Commonly known as: LIPITOR  Take 1 tablet (40 mg) by mouth nightly     famotidine 20 MG tablet  Commonly known as: PEPCID  Take 1 tablet (20 mg) by mouth every 12 (twelve) hours     sertraline 50 MG tablet  Commonly known as: ZOLOFT  Take 1 tablet (50 mg) by mouth daily  STOP taking these medications      aspirin 81 MG chewable tablet  Replaced by: aspirin 81 MG EC tablet     isosorbide mononitrate 30 MG 24 hr tablet  Commonly known as: IMDUR     mupirocin 2 % ointment  Commonly known as: BACTROBAN     nitroglycerin 0.4 MG SL tablet  Commonly known as: NITROSTAT     ranolazine 500 MG 12 hr tablet  Commonly known as: RANEXA               Where to Get Your Medications        These medications were sent to [id number] - [place], [place] - [address]  [address] [place] [address]      Phone: [phone]   acetaminophen 325 MG tablet  amiodarone 200 MG tablet  aspirin 81 MG EC tablet  clopidogrel 75 mg tablet  furosemide 20 MG tablet  gabapentin 100 MG capsule  nicotine 14 MG/24HR  senna-docusate 8.6-50 MG per tablet  tamsulosin 0.4 MG Caps  traMADol 50 MG tablet           AMI and CHF Heart Failure Core Measure Medication Checklist at Discharge:  Aspirin:    Prescribed  Statin:    Prescribed  Platelet Inhibitor: Prescribed:  Duration 3 months.  ACE-I:   Not Prescribed - EF > 40%   ARB:    Not Prescribed - EF > 40%  B-Blocker:   Prescribed      Discharge Instructions:       Follow-up with C-V surgeon in 1 week  If there is a Dermabond Prineo (mesh or tape), please remove at the 2 weeks post op date by RN, APP, home health, or the patient, unless it falls off prior to that.     Care Team Discharge Plan   Meds per AVS     Signed by: Rikki Spearing, NP

## 2022-02-05 NOTE — Progress Notes (Signed)
Time out pre-procedure pause performed with RN .  Physician/APP gave go ahead.  All necessary equipment was available.  Patient extubated to 6L nasasl cannula at 2255 on 02/05/2022.

## 2022-02-05 NOTE — Anesthesia Preprocedure Evaluation (Addendum)
Anesthesia Evaluation    AIRWAY    Mallampati: II    TM distance: >3 FB  Neck ROM: full  Mouth Opening:full   CARDIOVASCULAR    regular and normal       DENTAL    no notable dental hx                 PULMONARY    pulmonary exam normal     OTHER FINDINGS                                    Relevant Problems   CARDIO   (+) Chronic total occlusion of coronary artery   (+) Coronary artery disease of native artery with stable angina pectoris               Anesthesia Plan    ASA 4     general               (Name:     Erik [name]  Age:     46 years  DOB:     04-Dec-1975  Gender:     Male  MRN:     [id number]  Wt:     184 lb  BSA:     2.09 m2  Systolic BP:     90 mmHg  Diastolic BP:     50 mmHg  Technical Quality:     Technical difficult  Exam Date/Time:     10/05/2021 8:14 AM  Study Site:     [facility]  Exam Type:     ECHOCARDIOGRAM ADULT COMPLETE W CLR/ DOPP WAVEFORM  Technically difficult due to:     Poor acoustic windows    Study Info  Indications      chest pain -  Procedure    Complete two-dimensional, color flow and spectral Doppler transthoracic  echocardiogram is performed.    Reading Physician:     Charmayne Sheer MD    Staff  Sonographer:     Sarina Ill RCS  Ordering Physician:     Jules Schick    50 in      History/Risk Factors  Tobacco Use:     Yes      Summary    * Technically difficult echo due to poor acoustic windows.    * Normal left ventricular systolic function with an ejection fraction by 3D  of 56%, by Biplane Method of Discs of  54 %.    * Left ventricular diastolic filling parameters demonstrate normal diastolic  function.    * Normal right ventricular size and systolic function. TAPSE 2.8 cm. RV S'  16.7 cm/s.    * No significant valvular abnormalities.    * No prior study is available for comparison.      Findings  Left Ventricle    The left ventricle is normal in size. There is normal left ventricular  geometry. Normal left ventricular systolic function with an ejection fraction  by 3D of  56%, by Biplane Method of Discs of  54 %. Left ventricular segmental  wall motion is normal. Left ventricular diastolic filling parameters  demonstrate normal diastolic function.  Right Ventricle    The right ventricular cavity size is normal in size. Normal right  ventricular systolic function. TAPSE 2.8 cm. RV S' 16.7 cm/s.      Left Atrium    The left atrium is normal in size.  Right Atrium    The right atrium is normal in size.    Atrial Septum    No evidence of interatrial shunt by color Doppler.      Aortic Valve    The aortic valve is tricuspid. There is no aortic stenosis. There is no  aortic regurgitation.    Pulmonary Valve    The pulmonic valve is structurally normal. There is no pulmonic  regurgitation.    Mitral Valve    The mitral valve is structurally normal. There is no mitral regurgitation.    Tricuspid Valve    The tricuspid valve is structurally normal. There is trace tricuspid  regurgitation. No pulmonary hypertension with estimated right ventricular  systolic pressure of  23 mmHg.      Pericardium / Pleural Effusion    No pericardial effusion visualized.    Inferior Vena Cava    The IVC is normal in size with > 50% respiratory variance consistent with  normal RA pressure of 3 mmHg.    Aorta    The aortic root is normal in size. The ascending aorta is normal in size.      Measurements  2D Measurements  ----------------------------------------------------------------------  Name                                 Value        Normal  ----------------------------------------------------------------------    Parasternal 2D  ----------------------------------------------------------------------   IVS Diastolic Thickness  (2D)                               1.00 cm     0.60-1.00   LVID Diastole (2D)                 5.00 cm     4.20-5.80    LVIW Diastolic Thickness  (2D)                               1.00 cm     0.60-1.05   LVID Systole (2D)                  3.40 cm     2.50-4.00   LVOT  Diameter                      2.10 cm                 LA Dimension (2D)                  3.00 cm     3.00-4.10   Ao Root Diameter (2D)              3.65 cm     2.70-3.70     LV Ejection Fraction 2D  ----------------------------------------------------------------------  LV EF (BP MOD)                        54 %         52-72     Apical 2D Dimensions  ----------------------------------------------------------------------   RV Basal Diastolic  Dimension                          3.70 cm     2.50-4.10   LA Volume  Index (BP A-L)        19.93 ml/m2       <=34.00   RA Area (4C)                     16.30 cm2       <=18.00  M-mode Measurements  ----------------------------------------------------------------------  Name                                 Value        Normal  ----------------------------------------------------------------------    M-Mode  ----------------------------------------------------------------------  TAPSE                              2.83 cm        >=1.60  LVOT/Aortic Valve Doppler Measurements  ----------------------------------------------------------------------  Name                                 Value        Normal  ----------------------------------------------------------------------    LVOT Doppler  ----------------------------------------------------------------------  LVOT Peak Velocity                0.79 m/s                   AoV Doppler  ----------------------------------------------------------------------  AV Peak Velocity                  0.99 m/s                 AV Peak Gradient                    4 mmHg                 AV Mean Gradient                    2 mmHg           <20   AV Area (Cont Eq VTI)             3.12 cm2        >=3.00   AV V1/V2 Ratio                        0.80  Mitral Valve Measurements  ----------------------------------------------------------------------  Name                                 Value         Normal  ----------------------------------------------------------------------    MV Doppler  ----------------------------------------------------------------------  MV E Peak Velocity                0.54 m/s                 MV A Peak Velocity                0.63 m/s                 MV E/A                                0.86  MV Annular TDI  ----------------------------------------------------------------------  MV Septal e' Velocity             0.08 m/s        >=0.08   MV E/e' (Septal)                      6.72        <=8.00   MV Lateral e' Velocity            0.14 m/s        >=0.10   MV E/e' (Lateral)                     3.92        <=8.00  Tricuspid Valve Measurements  ----------------------------------------------------------------------  Name                                 Value        Normal  ----------------------------------------------------------------------    TV Regurgitation Doppler  ----------------------------------------------------------------------  TR Peak Velocity                  2.23 m/s                 RA Pressure                         3 mmHg           <=3   RV Systolic Pressure               23 mmHg           <36    Linked Documents    View Image    Imaging    Echocardiogram 2D complete (Order: [id number]) - 10/04/2021    Result History    Echocardiogram 2D complete (Order #[id number]) on 10/05/2021 - Order Result History Report    Linked Mudlogger signed by Charmayne Sheer, MD on 10/05/21 at [id number] EST    )      intravenous induction   Detailed anesthesia plan: general endotracheal  Monitors/Adjuncts: arterial line, CVP, PA cath, ANH and TEE          informed consent obtained    Plan discussed with CRNA.                   Signed by: Barbara Cower, MD 02/05/22 6:15 AM

## 2022-02-05 NOTE — Op Note (Signed)
Procedure Date: 02/05/2022    Patient Type: I    SURGEON: Austin Miles, MD    PREOPERATIVE DIAGNOSES:  1.  Coronary artery disease.  2.  Anxiety.  3.  Chronic obstructive pulmonary disease.    POSTOPERATIVE DIAGNOSES:  1.  Coronary artery disease.  2.  Anxiety.  3.  Chronic obstructive pulmonary disease.    TITLE OF PROCEDURES:  1.  Coronary artery bypass graft x3 (left internal mammary artery to distal  left anterior descending, saphenous vein graft to posterior descending   artery, radial to obtuse marginal 1).  2.  Patent foramen ovale closure.  3.  Left upper extremity endoscopic radial harvest.  4.  Left lower extremity endoscopic vein harvest.    CARDIOPULMONARY BYPASS TIME:  1 hour and 46 minutes.    CROSSCLAMP TIME:  1 hour and 30 minutes.    COMPLICATIONS:  Nil.    DISPOSITION:  CVICU.    CONDITION:  Critical, but stable.    ANESTHESIA:  General.    ESTIMATED BLOOD LOSS:  50 mL    DRAINS:  Three times 24-French Blake's, 1 in left chest, other 2 mediastinal.    SPECIMENS:  Nil.    FINDINGS:  1.  LAD and PDA 1.75 mm.  2.  OM1 1.25 mm and thick.  3.  Conduits good.  4.  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  No   residual PFO or septal aneurysm postoperatively.  5.  Thin aorta.  6.  Bicuspid aortic valve with well-functioning valve and no AI or AS.  No   aortic root or ascending aortic aneurysm.    All counts correct at the end of the case.  There were no immediate   postoperative complications and I was present throughout the entire   procedure.    INDICATIONS FOR PROCEDURE:  This 46 year old gentleman with known multivessel coronary artery disease   as well as anxiety was not deemed to be a suitable surgical candidate due   to the inadequate stenosis of the LAD.  He had an iFR and FFR that showed   borderline stenosis.  However, the patient kept having significant symptoms  and hence after a lengthy conversation, we decided to offer him bypass   surgery.  I had a lengthy  conversation with the patient and his mother   about alternatives, indications, risks, benefits of the operation.  They   had all their questions answered and agreed to proceed.    DESCRIPTION OF PROCEDURE:  After full informed consent was obtained, the patient was brought to the   operating room where he underwent general anesthesia with endotracheal tube  intubation.  Substandard lines were placed by the anesthesia team.  He was   then positioned, prepped and draped in the usual fashion.    TEE did find bicuspid aortic valve that was well-functioning with no AI or   AS. There was also no aortic root or ascending aortic aneurysm.  However,   the TEE did find a large PFO with a significant interatrial septal   aneurysm.  Hence, I decided to repair this during the procedure.    Left lower extremity endoscopic vein harvest was performed.  The leg was   wrapped at the end of the procedure.    Left upper extremity endoscopic radial harvest was performed.  The forearm   was then wrapped and tucked.    Midline sternotomy was performed.  LIMA was taken down as a pedicle from  the origin to the bifurcation.  The patient was fully heparinized, and LIMA  was divided distally where there was excellent pulsatile blood flow.    Pericardial well was made in a standard fashion.  Cannulas were then   placed, starting with the aortic cannula, SVC cannula, dual-stage venous   cannula via the right atrial appendage, retrograde coronary sinus cannula   and finally the antegrade/root vent cannula was placed.  When the ACT was   appropriate, the patient underwent full cardiopulmonary bypass.    Cross-clamp was placed and 1 liter of cold antegrade blood cardioplegia was  given.  There was good diastolic arrest with electrochemical silence.    Topical cooling was also used and this was followed by 1 liter of cold   retrograde blood cardioplegia.  Throughout the procedure at about 15-minute  intervals, cold retrograde blood cardioplegia  was given.    Dual stage venous cannula was advanced into the IVC.  Cable tapes were   snared.  Transverse right atriotomy was performed.  There was indeed a   large PFO with multiple fenestrations and a patulous-appearing interatrial   septum.  The PFO was closed with 2 layers of running 4-0 polypropylene   suture incorporating healthy tissue of the septum in essence, excluding the  aneurysm as well.  The right atriotomy was closed with 2 layers of running   4-0 polypropylene suture and the caval tapes removed.  The dual stage   venous cannula was brought back into its normal position and the SVC   cannula removed.    Inferior wall was inspected. PDA was identified as a suitable target.  An   arteriotomy was performed here and an end-to-side vein graft to coronary   artery anastomosis was performed which tested well with the hand flush and   sized to length.  I did the same for the OM1 with the radial artery.    Finally, the distal LAD was opened and an end-to-side LIMA to LAD   anastomosis was done with running 7-0 polypropylene suture in a   circumferential manner. When the bulldog clamp was removed off the LIMA   pedicle, the epicardial vessels filled instantly with blood.    Both proximal aortocoronary anastomoses were done consecutively in an   end-to-side fashion and marked with metal clips.    The patient was placed in steep Trendelenburg position.  Warm retrograde   followed by warm antegrade blood cardioplegia was given.  Lidocaine and   magnesium was given and the crossclamp removed.  The patient was in   spontaneous normal sinus rhythm.  A, V wires and subsequently drains were   placed.  The patient was gradually brought off cardiopulmonary bypass,   which was uneventful.  Protamine was administered, and he was decannulated   successfully.  When I was satisfied with hemostasis.  I proceeded with   closure, which was done with the Pioneer cable system in multiple layers.    All counts correct at the end  of the case.  There were no immediate   postoperative complications and I was present throughout the entire   procedure.    TEE confirmed well-functioning heart with no residual PFO or interatrial   septal aneurysm.      D: 02/05/2022 11:59 AM by Austin Miles, MD ([id number])  T: 02/05/2022 12:22 PM by MaFr [id number] (Conf: [id number]) (Doc ID:   [id number])

## 2022-02-05 NOTE — Op Note (Signed)
Procedure Date: 02/05/2022    Patient Type: I    SURGEON: Austin Milesamesh Bridgitt Raggio, MD    PREOPERATIVE DIAGNOSES:  1.  Coronary artery disease.  2.  Anxiety.  3.  Chronic obstructive pulmonary disease.    POSTOPERATIVE DIAGNOSES:  1.  Coronary artery disease.  2.  Anxiety.  3.  Chronic obstructive pulmonary disease.    TITLE OF PROCEDURES:  1.  Coronary artery bypass graft x3 (left internal mammary artery to distal  left anterior descending, saphenous vein graft to posterior descending   artery, radial to obtuse marginal 1).  2.  Patent foramen ovale closure.  3.  Left upper extremity endoscopic radial harvest.  4.  Left lower extremity endoscopic vein harvest.    CARDIOPULMONARY BYPASS TIME:  One hour and 46 minutes.    CROSSCLAMP TIME:  One hour and 30 minutes.    COMPLICATIONS:  Nil.    DISPOSITION:  CVICU.    CONDITION:  Critical, but stable.    ANESTHESIA:  General.    ESTIMATED BLOOD LOSS:  50 mL    DRAINS:  Three x24-French Blake, one from the left chest, other 2 mediastinal.    SPECIMENS:  Nil.    FINDINGS:  1.  LAD and PDA 1.75 mm.  2.  OM1 is 1.25 mm in thickness.  3.  Conduits good.  4.  Large PFO with fenestrations as well as atrial septal aneurysm.  There   was no residual PFO or septal aneurysm postoperatively.    NOTE:  All counts correct at the end of the case.  There were no immediate   postoperative complications and I was present throughout the entire   procedure.    INDICATIONS FOR PROCEDURE:  This 46 year old gentleman with known multivessel coronary artery disease,   had recurrent pain, but also with anxiety.  Previously he was deemed to be   not an appropriate surgical candidate due to inadequate LAD stenosis.  An   IFR was done, which showed borderline significant stenosis and due to the   patient continuing to have recurrent symptoms, it was felt that he would be  a suitable candidate for bypass surgery.  I had a lengthy conversation with  the patient and his mother about alternatives, indications,  risks, benefits  of the operation.  They had all their questions answered and agreed to   proceed.    DESCRIPTION OF PROCEDURE:  After full informed consent was obtained, the patient was brought to the   operating room where he underwent general anesthesia with endotracheal tube  intubation.  Standard lines were placed by the anesthesia team.  He was   then positioned, prepped and draped in the usual fashion.    Left lower extremity endoscopic vein harvest was performed.  The leg was   wrapped at the end of the procedure.    Left upper extremity endoscopic radial harvest was performed.  The forearm   was then wrapped and tucked.    Midline sternotomy was performed.  LIMA was taken down as a pedicle from   the origin to the bifurcation.  The patient was fully heparinized and the   LIMA was divided distally where there was excellent pulsatile blood flow.    Pericardial well was made in a standard fashion.  The TEE did find that the  patient had a bicuspid aortic valve, but with no aortic root or ascending   aortic aneurysm.  The valve was also functioning appropriately with no AI   or AS.  TEE also did identify a significant PFO with atrial septal   aneurysm.  Hence, I decided this should be repaired during this operation.    Cannulation was now done with the aortic cannula, SVC cannula, dual-stage   venous cannula via the right atrial appendage, retrograde coronary sinus   cannula and finally the antegrade/root vent cannula was placed.  When the   ACT was appropriate, the patient underwent full cardiopulmonary bypass.    Crossclamp was placed and 1 liter of cold antegrade blood cardioplegia was   given.  There was good diastolic arrest with electrochemical silence.    Topical cooling was also used and this was followed by 1 liter of cold   retrograde blood cardioplegia.  Throughout the procedure at about 15-minute  intervals, cold retrograde blood cardioplegia was given.    Caval tapes were snared with a dual stage  cannula pushed into the IVC.  A   transverse right atriotomy was performed.  The PFO indeed was quite large   and was fenestrated.  The atrial septum was also quite patulous and   aneurysmal.  The PFO was closed with 2 layers of running 4-0 polypropylene   suture including healthy tissue on either essentially performing an atrial   septoplasty, excluding the aneurysm in the process.  The right atriotomy   was closed with 2 layers of running 4-0 polypropylene suture and the caval   tapes were removed with a dual stage venous cannula brought back into its   normal position.  SVC cannula was now removed.    Inferior wall was inspected.  PDA was identified as a suitable target.  An   arteriotomy was performed here and an end-to-side vein graft to coronary   artery anastomosis was performed which tested well with the hand flush and   sized to length.  I did the same for the OM1 using a radial artery.  Next,   the distal LAD was opened and an end-to-side LIMA to LAD anastomosis was   done with running 7-0 polypropylene suture in a circumferential manner.    When the bulldog clamp was removed off the LIMA pedicle, the epicardial   vessels filled instantly with blood.    Both proximal aortocoronary anastomosis was done consecutively in an   end-to-side fashion and marked with metal clip.    The patient was placed in steep Trendelenburg position.  Warm retrograde   followed by warm antegrade blood cardioplegia was given.  Lidocaine and   magnesium was given and the crossclamp removed.  Patient was in spontaneous  normal sinus rhythm.  A, V wires and subsequently drains were placed.  The   patient was gradually brought off cardiopulmonary bypass, which was   uneventful.  Protamine was administered, and he was decannulated   successfully.  When I was satisfied with hemostasis, I proceeded with   closure, which was done with the Pioneer cable system in multiple layers.    All counts correct at the end of the case.  There were  no immediate   postoperative complications and I was present for the entire procedure.      D: 02/05/2022 11:55 AM by Austin Miles, MD ([id number])  T: 02/05/2022 12:07 PM by Charlynne Cousins [id number] (Conf: [id number]) (Doc ID:   [id number])

## 2022-02-06 ENCOUNTER — Inpatient Hospital Stay: Payer: Commercial Managed Care - POS

## 2022-02-06 ENCOUNTER — Encounter: Payer: Self-pay | Admitting: Thoracic Surgery (Cardiothoracic Vascular Surgery)

## 2022-02-06 DIAGNOSIS — Z01810 Encounter for preprocedural cardiovascular examination: Secondary | ICD-10-CM

## 2022-02-06 LAB — CBC
Absolute NRBC: 0 10*3/uL (ref 0.00–0.00)
Absolute NRBC: 0 10*3/uL (ref 0.00–0.00)
Hematocrit: 24.3 % — ABNORMAL LOW (ref 37.6–49.6)
Hematocrit: 24.8 % — ABNORMAL LOW (ref 37.6–49.6)
Hgb: 8.6 g/dL — ABNORMAL LOW (ref 12.5–17.1)
Hgb: 9.1 g/dL — ABNORMAL LOW (ref 12.5–17.1)
MCH: 32.3 pg (ref 25.1–33.5)
MCH: 33.5 pg (ref 25.1–33.5)
MCHC: 35.4 g/dL (ref 31.5–35.8)
MCHC: 36.7 g/dL — ABNORMAL HIGH (ref 31.5–35.8)
MCV: 91.2 fL (ref 78.0–96.0)
MCV: 91.4 fL (ref 78.0–96.0)
MPV: 9.2 fL (ref 8.9–12.5)
MPV: 9.4 fL (ref 8.9–12.5)
Nucleated RBC: 0 /100 WBC (ref 0.0–0.0)
Nucleated RBC: 0 /100 WBC (ref 0.0–0.0)
Platelets: 156 10*3/uL (ref 142–346)
Platelets: 159 10*3/uL (ref 142–346)
RBC: 2.66 10*6/uL — ABNORMAL LOW (ref 4.20–5.90)
RBC: 2.72 10*6/uL — ABNORMAL LOW (ref 4.20–5.90)
RDW: 13 % (ref 11–15)
RDW: 13 % (ref 11–15)
WBC: 6.07 10*3/uL (ref 3.10–9.50)
WBC: 7.02 10*3/uL (ref 3.10–9.50)

## 2022-02-06 LAB — HEPATIC FUNCTION PANEL
ALT: 25 U/L (ref 0–55)
AST (SGOT): 37 U/L (ref 5–41)
Albumin/Globulin Ratio: 1.5 (ref 0.9–2.2)
Albumin: 3.3 g/dL — ABNORMAL LOW (ref 3.5–5.0)
Alkaline Phosphatase: 46 U/L (ref 37–117)
Bilirubin Direct: 0.3 mg/dL (ref 0.0–0.5)
Bilirubin Indirect: 0.3 mg/dL (ref 0.2–1.0)
Bilirubin, Total: 0.6 mg/dL (ref 0.2–1.2)
Globulin: 2.2 g/dL (ref 2.0–3.6)
Protein, Total: 5.5 g/dL — ABNORMAL LOW (ref 6.0–8.3)

## 2022-02-06 LAB — GLUCOSE WHOLE BLOOD - POCT
Whole Blood Glucose POCT: 113 mg/dL — ABNORMAL HIGH (ref 70–100)
Whole Blood Glucose POCT: 115 mg/dL — ABNORMAL HIGH (ref 70–100)
Whole Blood Glucose POCT: 123 mg/dL — ABNORMAL HIGH (ref 70–100)
Whole Blood Glucose POCT: 126 mg/dL — ABNORMAL HIGH (ref 70–100)
Whole Blood Glucose POCT: 131 mg/dL — ABNORMAL HIGH (ref 70–100)
Whole Blood Glucose POCT: 135 mg/dL — ABNORMAL HIGH (ref 70–100)
Whole Blood Glucose POCT: 149 mg/dL — ABNORMAL HIGH (ref 70–100)
Whole Blood Glucose POCT: 156 mg/dL — ABNORMAL HIGH (ref 70–100)

## 2022-02-06 LAB — BASIC METABOLIC PANEL
Anion Gap: 5 (ref 5.0–15.0)
BUN: 9 mg/dL (ref 9.0–28.0)
CO2: 24 mEq/L (ref 17–29)
Calcium: 8.6 mg/dL (ref 8.5–10.5)
Chloride: 99 mEq/L (ref 99–111)
Creatinine: 0.8 mg/dL (ref 0.5–1.5)
Glucose: 130 mg/dL — ABNORMAL HIGH (ref 70–100)
Potassium: 4.6 mEq/L (ref 3.5–5.3)
Sodium: 128 mEq/L — ABNORMAL LOW (ref 135–145)

## 2022-02-06 LAB — PLATELET AGGREGATION CV OR
Functional Platelet Count: 73 10*3
Total Platelet Count CVOR: 154 10*3

## 2022-02-06 LAB — ECG 12-LEAD
Atrial Rate: 89 {beats}/min
P Axis: 54 degrees
P-R Interval: 244 ms
Q-T Interval: 382 ms
QRS Duration: 100 ms
QTC Calculation (Bezet): 464 ms
R Axis: 76 degrees
T Axis: -37 degrees
Ventricular Rate: 89 {beats}/min

## 2022-02-06 LAB — MAGNESIUM: Magnesium: 2 mg/dL (ref 1.6–2.6)

## 2022-02-06 LAB — HEMOGLOBIN AND HEMATOCRIT, BLOOD
Hematocrit: 24.6 % — ABNORMAL LOW (ref 37.6–49.6)
Hgb: 8.7 g/dL — ABNORMAL LOW (ref 12.5–17.1)

## 2022-02-06 LAB — GFR: EGFR: >60

## 2022-02-06 LAB — FIBRINOGEN: Fibrinogen: 323 mg/dL (ref 181–413)

## 2022-02-06 LAB — PT/INR
PT INR: 1 (ref 0.9–1.1)
PT: 11.9 s (ref 10.1–12.9)

## 2022-02-06 MED ORDER — INSULIN LISPRO 100 UNIT/ML SOLN (WRAP)
1.0000 [IU] | Freq: Every evening | Status: DC
Start: 2022-02-06 — End: 2022-02-09

## 2022-02-06 MED ORDER — GLUCAGON 1 MG IJ SOLR (WRAP)
1.0000 mg | INTRAMUSCULAR | Status: DC | PRN
Start: 2022-02-06 — End: 2022-02-10

## 2022-02-06 MED ORDER — FOLIC ACID 1 MG PO TABS
1.0000 mg | ORAL_TABLET | Freq: Every day | ORAL | Status: DC
Start: 2022-02-06 — End: 2022-02-10
  Administered 2022-02-06 – 2022-02-10 (×5): 1 mg via ORAL
  Filled 2022-02-06 (×5): qty 1

## 2022-02-06 MED ORDER — ALBUTEROL-IPRATROPIUM 2.5-0.5 (3) MG/3ML IN SOLN
3.0000 mL | Freq: Four times a day (QID) | RESPIRATORY_TRACT | Status: DC | PRN
Start: 2022-02-07 — End: 2022-02-06

## 2022-02-06 MED ORDER — NICOTINE 14 MG/24HR TD PT24
1.0000 | MEDICATED_PATCH | Freq: Every day | TRANSDERMAL | Status: DC
Start: 2022-02-06 — End: 2022-02-06
  Administered 2022-02-06: 1 via TRANSDERMAL
  Filled 2022-02-06 (×2): qty 1

## 2022-02-06 MED ORDER — METOPROLOL TARTRATE 25 MG PO TABS
12.5000 mg | ORAL_TABLET | Freq: Two times a day (BID) | ORAL | Status: DC
Start: 2022-02-07 — End: 2022-02-10
  Administered 2022-02-07 – 2022-02-10 (×6): 12.5 mg via ORAL
  Filled 2022-02-06 (×7): qty 1

## 2022-02-06 MED ORDER — DEXTROSE 10 % IV BOLUS
12.5000 g | INTRAVENOUS | Status: DC | PRN
Start: 2022-02-06 — End: 2022-02-10

## 2022-02-06 MED ORDER — SERTRALINE HCL 50 MG PO TABS
50.0000 mg | ORAL_TABLET | Freq: Every day | ORAL | Status: DC
Start: 2022-02-07 — End: 2022-02-07
  Administered 2022-02-07: 50 mg via ORAL
  Filled 2022-02-06: qty 1

## 2022-02-06 MED ORDER — TAB-A-VITE/BETA CAROTENE PO TABS
1.0000 | ORAL_TABLET | Freq: Every day | ORAL | Status: DC
Start: 2022-02-06 — End: 2022-02-10
  Administered 2022-02-06 – 2022-02-10 (×5): 1 via ORAL
  Filled 2022-02-06 (×5): qty 1

## 2022-02-06 MED ORDER — GABAPENTIN 300 MG PO CAPS
300.0000 mg | ORAL_CAPSULE | Freq: Three times a day (TID) | ORAL | Status: DC
Start: 2022-02-06 — End: 2022-02-09
  Administered 2022-02-06 – 2022-02-09 (×10): 300 mg via ORAL
  Filled 2022-02-06 (×10): qty 1

## 2022-02-06 MED ORDER — LORAZEPAM 2 MG/ML IJ SOLN
1.0000 mg | INTRAMUSCULAR | Status: DC | PRN
Start: 2022-02-06 — End: 2022-02-09

## 2022-02-06 MED ORDER — ALBUTEROL SULFATE (2.5 MG/3ML) 0.083% IN NEBU
2.5000 mg | INHALATION_SOLUTION | Freq: Four times a day (QID) | RESPIRATORY_TRACT | Status: DC | PRN
Start: 2022-02-06 — End: 2022-02-10

## 2022-02-06 MED ORDER — INSULIN LISPRO 100 UNIT/ML SOLN (WRAP)
1.0000 [IU] | Freq: Three times a day (TID) | Status: DC
Start: 2022-02-06 — End: 2022-02-09

## 2022-02-06 MED ORDER — DEXTROSE 50 % IV SOLN
12.5000 g | INTRAVENOUS | Status: DC | PRN
Start: 2022-02-06 — End: 2022-02-10

## 2022-02-06 MED ORDER — OXYCODONE HCL 5 MG PO TABS
5.0000 mg | ORAL_TABLET | Freq: Four times a day (QID) | ORAL | Status: DC | PRN
Start: 2022-02-06 — End: 2022-02-07
  Administered 2022-02-06 – 2022-02-07 (×5): 5 mg via ORAL
  Filled 2022-02-06 (×5): qty 1

## 2022-02-06 MED ORDER — SODIUM CHLORIDE 0.9 % IV SOLN
0.3000 ug/kg | Freq: Once | INTRAVENOUS | Status: AC
Start: 2022-02-06 — End: 2022-02-06
  Administered 2022-02-06: 26 ug via INTRAVENOUS
  Filled 2022-02-06: qty 6.5

## 2022-02-06 MED ORDER — THIAMINE (VITAMIN B1) 100 MG PO TABS (WRAP)
200.0000 mg | ORAL_TABLET | Freq: Every day | ORAL | Status: AC
Start: 2022-02-06 — End: 2022-02-10
  Administered 2022-02-06 – 2022-02-10 (×5): 200 mg via ORAL
  Filled 2022-02-06 (×5): qty 2

## 2022-02-06 MED ORDER — TIOTROPIUM BROMIDE MONOHYDRATE 2.5 MCG/ACT IN AERS
1.0000 | INHALATION_SPRAY | Freq: Every morning | RESPIRATORY_TRACT | Status: DC
Start: 2022-02-06 — End: 2022-02-10
  Administered 2022-02-06 – 2022-02-10 (×4): 1 via RESPIRATORY_TRACT
  Filled 2022-02-06: qty 1

## 2022-02-06 MED ORDER — GLUCOSE 40 % PO GEL (WRAP)
15.0000 g | ORAL | Status: DC | PRN
Start: 2022-02-06 — End: 2022-02-10

## 2022-02-06 MED ORDER — NICOTINE 7 MG/24HR TD PT24
1.0000 | MEDICATED_PATCH | Freq: Every day | TRANSDERMAL | Status: DC
Start: 2022-02-06 — End: 2022-02-06

## 2022-02-06 MED ORDER — ALBUTEROL-IPRATROPIUM 2.5-0.5 (3) MG/3ML IN SOLN
3.0000 mL | Freq: Four times a day (QID) | RESPIRATORY_TRACT | Status: DC
Start: 2022-02-06 — End: 2022-02-06

## 2022-02-06 NOTE — Progress Notes (Signed)
Cardiac Surgery Progress Note  Date/Time:  02/07/22 5:12 AM   Patient Name:  Erik [name] Age: 46 y.o.   Room:  FI201/FI201-01     Operative Profile:   POD#: 2    02/05/22:  Elective, CABG x 3 (LIMA-LAD, SVG-PDA, L Radial-OM1), PFO closure             Surgeon: Austin MilesSingh, Ramesh, MD             From OR: Gtts: Levo @ 1 ; Products: NONE ; Rhythm: Sinus Rhythm 60s    LVEF: 50%%, RVEF NORMAL  Cardiologist: Trellis MomentLindsey Cilia, MD, [facility]    Synopsis   Indication for Surgery: Progressive angina, multivessel CAD    Preop Course: 46 y/o male w/ PmHx of HTN, HLD, CAD, hyponatremia, smoker, Heavy ETOH use, depression, TBI, who was recently evaluated in Dec 22' w/ c/o of worsening exertional angina/CP, LHC 01/08/22 revealed 3 vessel disease CAD, w/ 100% prox CTO of RCA. On 02/05/22 he was brought to the OR for elective CABG.    PMH:  has a past medical history of Chest pain (12/10/2021), Coronary artery disease involving native coronary artery of native heart without angina pectoris (02/04/2022), COVID (09/2021), Hearing loss in Left ear, History of echocardiogram (10/05/2021), History of nuclear stress test: exercise SPECT abn (11/22/2021), Hyperlipidemia, Hyponatremia, and TBI (traumatic brain injury) (1999).    Daily Summary:  4/26. SDA--> OR, chest tubes oozing, required 4 PLT, 4 Cryoprecipitate, extubated    4/27: Orthostatic, d/c BB, started on CIWA, zoloft    Plan:   Remains in ICU, Tx CVSDU when able to dangle and OOB w/o symptoms, encourage PT/OT and pain management.   -  Monitor chest tubes  - Encourage PO intake, holding diuretics and BB given orthostasis  - Monitor for ETOH withdrawal, continue CIWA  - Likely COPD, Spirivia and PRN duonebs  - Will need DAPT, once PW removed    Standard Events:     -    Extubated  POD #0   -    Transferred to SDU POD # ___   -    Removed CTs POD # ___   -    Removed PW POD # ___ by *ing             System Assessment of Active Issues      Neurological?:  Pain management: Pain poorly controlled, increased gabapentin  Chronic Depression, restarted on zoloft   Heavy ETOH use, last drink 4/25, asymptomatic, on CIWA protocol  Nicotine dependence: no replacement due to risk of coronary graft spasm; smoking cessation reinforced  Hx of TBI, no baseline deficits      Cardiovascular:  CAD/Multivessel, s/p CABG, stable/improving, continue ASA, Statin, start plavix once PW removed  HTN, controlled no medications, resume low dose BB when orthostasis resolved  Orthostatic Hypotension, transient, holding BB and diuretics, mobilize, encourage PO intake  HLD, Continue statin    Pulmonary:  Mild COPD/Current Smoker/20 pack year smoker, mild DOE, Started spiriva 4/27, continue w/ PRN duonebs     Gastrointestinal:  No acute issues     Renal/GU:  Volume overload, improving autodiuresing  Moderate hyponatremia, acute on chronic, asymptomatic baseline upper 120's, avoid free water, trend w/ outpatient f/u w/ PCP, consider nephrology consult if worsens     Infectious Disease:  Clinically, no acute issue      Hematological:  Acute Blood Loss Anemia, and Coagulopathy as expected from surgery, H &H stable   Thrombocytopenia, tolerating low dose  ASA, trend closely during hospital course      Endocrinological:  Non DM, Stress Hyperglycemia controlled on insulin SSI ACHS  HgbA1C: 5.1   Lab Results   Component Value Date    HGBA1C 5.1 01/28/2022            Subjective:   No complaints     Physical Exam:   BP 115/63   Pulse 78   Temp 98.5 F (36.9 C) (Oral)   Resp 15   Ht 1.88 m (6\' 2" )   Wt 89.7 kg (197 lb 12 oz)   SpO2 99%   BMI 25.39 kg/m     Physical Exam:  Neuro: Intact, MAE's, follows commands  Cardiac: normal S1/S2, no murmur  Lungs: diminished   Abdomen: soft, NT/ND, active BS  Extremities: warm and well perfused, palpable pulses  Wounds: C/D/I        Specific Labs:     CBC:  Recent Labs     02/06/22  1036   WBC 7.02   Hgb 8.6*   Hematocrit 24.3*   Platelets 159      BMP:  Recent Labs     02/06/22  0023   Sodium 128*   Potassium 4.6   Chloride 99   CO2 24   BUN 9.0   Creatinine 0.8   Glucose 130*   Magnesium 2.0   Calcium 8.6         Meds:      niCARdipine      nitroglycerin      norepinephrine Stopped (02/05/22 1520)        Current Facility-Administered Medications   Medication Dose Route Frequency    acetaminophen  1,000 mg Oral 4 times per day    amiodarone  200 mg Oral Q12H [facility]    aspirin EC  81 mg Oral Daily    atorvastatin  40 mg Oral QHS    chlorhexidine  15 mL Mouth/Throat Q12H [facility]    famotidine  20 mg Oral Q12H [facility]    folic acid  1 mg Oral Daily    gabapentin  300 mg Oral Q8H [facility]    insulin lispro  1-3 Units Subcutaneous QHS    insulin lispro  1-5 Units Subcutaneous TID AC    metoprolol tartrate  12.5 mg Oral Q12H [facility]    multivitamin  1 tablet Oral Daily    mupirocin   Nasal Q12H [facility]    polyethylene glycol  17 g Oral Daily    senna-docusate  2 tablet Oral Q12H [facility]    sertraline  50 mg Oral Daily    thiamine  200 mg Oral Daily    tiotropium  1 puff Inhalation QAM           Signed by: [facility] [place]                      Cardiac Surgery

## 2022-02-06 NOTE — Plan of Care (Signed)
Problem: Moderate/High Fall Risk Score >5  Goal: Patient will remain free of falls  Outcome: Progressing     Problem: Post-op Phase - Cardiac Surgery  Goal: Effective breathing pattern is maintained  Outcome: Progressing  Goal: Patient will remain free from post-op complications  Outcome: Progressing  Goal: Mobility/activity is maintained at optimal level  Outcome: Progressing  Goal: Nutritional intake is adequate  Outcome: Progressing     Problem: Compromised Tissue integrity  Goal: Damaged tissue is healing and protected  Outcome: Progressing  Goal: Nutritional status is improving  Outcome: Progressing

## 2022-02-06 NOTE — PT Eval Note (Signed)
Physical Therapy Evaluation Erik [name]  FI201/FI201-01   POST ACUTE CARE THERAPY RECOMMENDATIONS:      Discharge Recommendations:  Home with supervision, Home with home health PT    D/C Milestones: increased ambulation distance and stair training; Anticipate achievement in 1-2 sessions    DME needs IF patient is discharging home: Front wheel walker    Therapy discharge recommendations may change with patient status.  Please refer to most recent note for up-to-date recommendations.     ASSESSMENT:     Significant Findings: none    Erik [name] is a 46 y.o. male admitted 02/05/2022.  Patient in bed upon arrival, amenable to physical therapy evaluation. Patient was educated on move-in-tube precautions, and was able to maintain them t/o evaluation. Patient performed all functional mobility with Min assist, standing and taking steps to bedside recliner. Patient impulsive with fair safety awareness. Patient reported feeling lightheaded with mobility - all vitals stable. Patient will continue to benefit from inpatient PT services to maximize functional mobility and independence.    Impairments: decreased activity tolerance, gait impairment, decreased strength, impaired balance, impaired bed mobility, decreased stamina, and pain    Therapy Diagnosis: impaired functional mobility    Rehabilitation Potential: good    Treatment Activities: Evaluation, therapeutic exercise    Educated the patient to role of physical therapy, plan of care, goals of therapy and safety with mobility and ADLs, HEP, move-in-tube precautions.     PLAN:   Treatment/Interventions: Exercise, Gait training, Stair training, Neuromuscular re-education, Functional transfer training, LE strengthening/ROM, Endurance training, Patient/family training, Bed mobility, Equipment eval/education     PT Frequency: 3-4x/wk   Risks/Benefits/POC Discussed with Pt/Family: With patient/family        Precautions and Contraindications:   Falls  Move-in-tube  precautions    Consult received for Erik [name] for PT Evaluation and Treatment.  Patient's medical condition is appropriate for Physical therapy intervention at this time.     HISTORY OF PRESENT ILLNESS:   Per H&P:  Erik [name] is a 46 y.o. male admitted on 02/05/2022 s/p CABG.    Procedure(s):  CORONARY ARTERY BYPASS X 3 ; LIMA  ENDOSCOPIC,VEIN HARVEST  ENDOSCOPY, HARVEST RADIAL ARTERY  ECHOCARDIOGRAM, TRANSESOPHAGEAL  CLOSURE, PATENT FORAMEN OVALE (OPEN)    1 Day Post-Op  -------------------     Medical Diagnosis: Coronary artery disease involving native coronary artery of native heart without angina pectoris [I25.10]  Pre-operative cardiovascular examination [Z01.810]    Past Medical/Surgical History:  Past Medical History:   Diagnosis Date    Chest pain 12/10/2021    Coronary artery disease involving native coronary artery of native heart without angina pectoris 02/04/2022    pre op dx    COVID 09/2021    11/2021: asymptomatic    Hearing loss in Left ear     History of echocardiogram 10/05/2021    EF 54    History of nuclear stress test: exercise SPECT abn 11/22/2021    Hyperlipidemia     on Rx    Hyponatremia     TBI (traumatic brain injury) 1999      SOCIAL HISTORY:   Prior Level of Function:  Prior level of function: Independent with all ADLs  Baseline activity level: community distances  DME Currently at Home: None    Home Living Arrangements:   Living Arrangements: patient lives alone  Home Layout: single level home  Stairs to Enter: none     SUBJECTIVE: "My mom will help me at home"  Patient is agreeable to participation in the therapy session. Nursing clears patient for therapy.     Patient goal: return to PLOF    Pain Assessment:  Numeric Scale (0-10): 10  Location: sternum  Intervention: positioned for comfort     OBJECTIVE:   Observation of Patient:   Patient with telemetry, peripheral IV, chest tube, external pacer in place.  PPE worn during session: gloves    Cognition/Neuro  Status:  Orientation Level: Person, Place , Time, and Situation  Safety Awareness: minimal verbal instruction    Musculoskeletal Examination:  Gross ROM  RUE: within normal limits  LUE: within normal limits  RLE: within normal limits  LLE: within normal limits    Gross Strength  RUE: 3+/5 (at least, with precautions)  LUE: 3+/5 (at least, with precautions)  RLE: 4+/5  LLE: 4+/5    Functional Mobility:  PMP - Progressive Mobility Protocol   PMP Activity: Step 6 - Walks in Room  Distance Walked (ft) (Step 6,7): 2 Feet    Bed Mobility:     Supine to Sit: Minimal assistance    Transfers:  Sit to Stand: Minimal assistance  Stand to Sit: Minimal assistance    Ambulation:   Distance: 2 Feet  Device: None (HHA)  Assist Level: Minimal assistance  Pattern: decreased pace, decreased cadence, forward flexed posture, shuffle    Therex (Seated):  Ankle pumps: 10 reps bilaterally  LAQ's: 10 reps bilaterally  Hip flexion: 10 reps bilaterally    Balance:  Static sitting: good   Dynamic sitting: good   Static standing: fair   Dynamic standing: fair     Sensation:  Right UE: intact   Left UE: intact   Right LE: intact   Left LE: intact     Participation and Activity Tolerance:  Participation Effort: good  Patient Endurance: fair    Patient left with call bell within reach, all needs met, and all questions answered    SCD's off (as found)  Floor mat in place  Chair alarm in place    Goals:   Goals  Goal Formulation: With patient/family  Time for Goal Acheivement: 5 visits  Goals: Select goal  Pt Will Go Supine To Sit: with supervision  Pt Will Perform Sit to Stand: with supervision  Pt Will Ambulate: > 200 feet, with supervision, with rolling walker  Pt Will Go Up / Down Stairs: 6-10 stairs, with supervision, With rail    Time of treatment:   PT Received On: 02/06/22  Start Time: 1430  Stop Time: 1510  Time Calculation (min): 40 min    Daleen Squibbimothy D Luane Rochon, PT, DPT

## 2022-02-06 NOTE — Consults (Signed)
[facility]- CVICU  Medical Critical Care Service [facility]) Consult Note        Date / Time: 02/06/2310:15 AM Room:   FI201/FI201-01  Patient:   Erik [name], Erik [name]   Admitted: 02/05/2022  Attending (consulting MD)   Austin Miles, MD   Reason for consult:     Status: Full Code     Critical Care PROGRESS Note --  Hospital Day /  LOS: 1 day        HPI:  Dimarco Minkin is a 46 y.o. 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 01/07/2022    Chronic total occlusion of coronary artery 12/18/2021    Abnormal exercise myocardial perfusion study 12/12/2021    History of heart attack 11/12/2021    Chest pain 10/04/2021     -Stress-induced hyperglycemia  -Acute blood loss anemia  Focused Plan Based on System:   Neuro: Risk for ICU delirium, acute pain control needs, Etoh abuse  -pain controlled with tylenol, gabapentin  - OOBTC with PT/OT consult as indicated  -cont home Zoloft  -initiate CIWA monitoring given heavy etoh abuse    CV: expected cardiogenic shock, vasoplegic shock in the postoperative setting, not due to surgical complication-both resolved  - MAP > 65 mmHg, vasopressors as needed to achieve, currently off  -well-perfused off inotropic support  -NSR with pacer back up; amiodarone ppx; beta blocker started this AM  -watch chest tube output-starting to taper, no evidence for hemothorax or tamponade  -asa/statin    Pulm:  Expected acute hypoxic respiratory failure in the postoperative setting, not due to surgical complication: resolved  - goal sao2 >92%; on 2L NC, breathing comfortably  -get OOB  -mild COPD on pre-op PFTs; start Spiriva with prn albuterol  -severe nicotine w/d, ok to start nicotine patch   -BPH    GU: hyponatremia, volume overload  - Renally dose all medications and avoid nephrotoxins.   - Electrolyte repletion per protocol.  -start diuresis     Endo: stress-induced hyperglycemia  - SSI for BG goal 120-180 mg/dL.    Heme: acute blood loss anemia  -chest tube output tapering as above; coagulopathy corrected, transfusing for Hg goal >7    ID: Risk for iatrogenic infections   - No evidence infection at the present time, no requirement for antibiotics.   - De-escalation of indwelling tubes, catheters, and vascular access lines.     Code Status: Full Code  Ppx: Pepcid, SCDs  Diet: advancing, bowel regimen   Access:remove CVL and A Line    Dispo: ok for SDU      Past Medical History:     Past Medical History:   Diagnosis Date    Chest pain 12/10/2021    Coronary artery disease involving native coronary artery of native heart without angina pectoris 02/04/2022    pre op dx    COVID 09/2021    11/2021: asymptomatic    Hearing loss in  Left ear     History of echocardiogram 10/05/2021    EF 54    History of nuclear stress test: exercise SPECT abn 11/22/2021    Hyperlipidemia     on Rx    Hyponatremia     TBI (traumatic brain injury) 31       Available old records reviewed, including:  EPIC    Past Surgical History:     Past Surgical History:   Procedure Laterality Date    DENTAL SURGERY      LHC W/ CORONARY ANGIOS AND LV Left 12/12/2021    Procedure: LHC w/ Coronary Angios and LV;  Surgeon: Cilia, Early Chars, MD;  Location: [facility] CARDIAC CATH;  Service: Cardiovascular;  Laterality: Left;    LHC W/ CORONARY ANGIOS AND LV Left 01/08/2022    Procedure: LHC w/ Coronary Angios and LV;  Surgeon: Tawanna Sat, MD;  Location: [facility]  CARDIAC CATH;  Service: Cardiovascular;  Laterality: Left;  dFR LAD       Family History:     Family History   Problem Relation Age of Onset    Hypothyroidism Mother     Heart attack Father     Hyperlipidemia Sister     Hypertension Sister     Diabetes Brother     COPD Brother     Heart attack Maternal Grandfather        Social History:     Social History     Socioeconomic History    Marital status: Widowed     Spouse name: Not on file    Number of children: Not on file    Years of education: Not on file    Highest education level: Not on file   Occupational History    Not on file   Tobacco Use    Smoking status: Every Day     Packs/day: 0.50     Years: 40.00     Pack years: 20.00     Types: Cigarettes    Smokeless tobacco: Never   Vaping Use    Vaping status: Never Used   Substance and Sexual Activity    Alcohol use: Yes     Comment: 24-48 drinks/ week    Drug use: Never    Sexual activity: Not on file   Other Topics Concern    Not on file   Social History Narrative    Widowed. Has one daughter lives out state. He is living in a hotel right now. Unemployed. Mother lives in [place]. Sister lives in [place].      Social Determinants of Health     Financial Resource Strain: Not on file   Food Insecurity: Not on file   Transportation Needs: Not on file   Physical Activity: Not on file   Stress: Not on file   Social Connections: Not on file   Intimate Partner Violence: Not on file   Housing Stability: Not on file       Allergies:   No Known Allergies        Medications:     Medications Prior to Admission   Medication Sig    aspirin 81 MG chewable tablet Chew 1 tablet (81 mg) by mouth daily (Patient taking differently: Chew 81 mg by mouth every morning)    atorvastatin (LIPITOR) 40 MG tablet Take 1 tablet (40 mg) by mouth nightly    famotidine (PEPCID) 20 MG tablet Take 1 tablet (20 mg) by mouth every 12 (twelve) hours  isosorbide mononitrate (IMDUR) 30 MG 24 hr tablet Take 3 tablets (90 mg) by mouth daily    metoprolol  succinate XL (TOPROL-XL) 25 MG 24 hr tablet Take 1 tablet (25 mg) by mouth daily (Patient taking differently: Take 25 mg by mouth every morning)    mupirocin (BACTROBAN) 2 % ointment Apply to both nostril twice a day as directed for 5 days prior to surgery.    nitroglycerin (NITROSTAT) 0.4 MG SL tablet Place 1 tablet (0.4 mg) under the tongue every 5 (five) minutes as needed for Chest pain May repeat up to 3x.  CALL 911 FOR UNRELIEVED CHEST PAIN    ranolazine (RANEXA) 500 MG 12 hr tablet Take 2 tablets (1,000 mg) by mouth 2 (two) times daily    sertraline (ZOLOFT) 50 MG tablet Take 1 tablet (50 mg) by mouth daily       Current Inpatient :  Current Facility-Administered Medications   Medication Dose Route Frequency    acetaminophen  1,000 mg Oral 4 times per day    amiodarone  200 mg Oral Q12H [facility]    aspirin EC  81 mg Oral Daily    atorvastatin  40 mg Oral QHS    chlorhexidine  15 mL Mouth/Throat Q12H [facility]    famotidine  20 mg Oral Q12H [facility]    folic acid  1 mg Oral Daily    gabapentin  300 mg Oral Q8H [facility]    insulin lispro  1-3 Units Subcutaneous QHS    insulin lispro  1-5 Units Subcutaneous TID AC    metoprolol tartrate  12.5 mg Oral Q12H [facility]    multivitamin  1 tablet Oral Daily    mupirocin   Nasal Q12H [facility]    nicotine  1 patch Transdermal Daily    polyethylene glycol  17 g Oral Daily    senna-docusate  2 tablet Oral Q12H [facility]    thiamine  200 mg Oral Daily    tiotropium  1 puff Inhalation QAM       Home Medications :     Prior to Admission medications    Medication Sig Start Date End Date Taking? Authorizing Provider   aspirin 81 MG chewable tablet Chew 1 tablet (81 mg) by mouth daily  Patient taking differently: Chew 81 mg by mouth every morning 11/12/21  Yes Cilia, Early Chars, MD   atorvastatin (LIPITOR) 40 MG tablet Take 1 tablet (40 mg) by mouth nightly 11/12/21  Yes Cilia, Early Chars, MD   famotidine (PEPCID) 20 MG tablet Take 1 tablet (20 mg) by mouth every 12 (twelve) hours 01/21/22  Yes Margot Chimes A, DO    isosorbide mononitrate (IMDUR) 30 MG 24 hr tablet Take 3 tablets (90 mg) by mouth daily 01/30/22  Yes Cilia, Early Chars, MD   metoprolol succinate XL (TOPROL-XL) 25 MG 24 hr tablet Take 1 tablet (25 mg) by mouth daily  Patient taking differently: Take 25 mg by mouth every morning 11/12/21  Yes Cilia, Early Chars, MD   mupirocin (BACTROBAN) 2 % ointment Apply to both nostril twice a day as directed for 5 days prior to surgery. 01/31/22  Yes Jacqulyn Ducking, NP   nitroglycerin (NITROSTAT) 0.4 MG SL tablet Place 1 tablet (0.4 mg) under the tongue every 5 (five) minutes as needed for Chest pain May repeat up to 3x.  CALL 911 FOR UNRELIEVED CHEST PAIN 01/07/22  Yes Rosalia Hammers, PA   ranolazine (RANEXA) 500 MG 12 hr tablet Take 2 tablets (1,000 mg)  by mouth 2 (two) times daily 01/06/22  Yes Cilia, Early Chars, MD   sertraline (ZOLOFT) 50 MG tablet Take 1 tablet (50 mg) by mouth daily 01/21/22  Yes Margot Chimes A, DO            Review of Systems:   Review of Systems   Constitutional: Negative.    HENT: Negative.     Eyes: Negative.    Respiratory: Negative.     Cardiovascular:  Positive for chest pain. Negative for palpitations, orthopnea and claudication.   Gastrointestinal: Negative.    Genitourinary: Negative.    Musculoskeletal: Negative.    Skin: Negative.    Neurological:  Negative for dizziness, tingling, tremors, sensory change, speech change, seizures, loss of consciousness, weakness and headaches.        Nicotine w/d   Endo/Heme/Allergies: Negative.    Psychiatric/Behavioral: Negative.           Physical Exam:     Vitals:    02/06/22 1100   BP: 115/64   Pulse: 85   Resp: (!) 23   Temp:    SpO2: 93%       Intake and Output Summary (Last 24 hours) at Date Time    Intake/Output Summary (Last 24 hours) at 02/06/2022 1115  Last data filed at 02/06/2022 1100  Gross per 24 hour   Intake 5324.87 ml   Output 5540 ml   Net -215.13 ml       GEN: NAD, well-appearing  HEENT: PERRLA, sclera anicteric  PULM: CTAB, no increased work  of breathing  CV: RRR, no r/m/g, no peripheral edema, DP pulses 2+ b/l  GI: soft, NT, ND, NABS  SKIN: no rashes/lesions  NEURO: alert, no focal deficits        Labs:     Labs (last 72 hours):  Recent Labs     02/06/22  1036 02/06/22  0536 02/06/22  0023 02/05/22  1943   WBC 7.02  --  6.07 7.19   Hgb 8.6* 8.7* 9.1* 9.2*   Hematocrit 24.3* 24.6* 24.8* 25.2*     Recent Labs     02/06/22  0023 02/05/22  1943 02/05/22  1547 02/05/22  1320   PT 11.9 11.7 12.1 12.8   PT INR 1.0 1.0 1.0 1.1   PTT  --  25* 33 32    Recent Labs     02/06/22  0023 02/05/22  1943 02/05/22  1212   Sodium 128* 129* 128*   Potassium 4.6 4.8 4.8   Chloride 99 99 100   CO2 24 23 24    BUN 9.0 9.0 8.0*   Creatinine 0.8 0.8 0.8   Glucose 130* 129* 83   Calcium 8.6 8.2* 7.5*   Magnesium 2.0 2.1 2.6                   Radiology / Imaging:     Imaging personally reviewed by me  including: CXR, mild congestion    X-ray chest AP portable (daily)   Final Result          1.Tubes and lines as above.   2.Residual left basilar atelectasis.   3.Mild vascular congestion.      Campbell Stall   02/06/2022 8:33 AM      XR Chest AP Portable   Final Result         1. Increased opacity along the left superior mediastinum, partially obscuring the aortic knob. Mediastinal hemorrhage not excluded. Chest CT recommended for further evaluation.  COMMENT: This urgent result was discussed with and acknowledged by Gerarda Fraction, NP at 1905 hours on 02/05/2022.      Kennyth Lose, MD   02/05/2022 7:10 PM      X-ray chest AP portable (once)   Final Result   Postoperative changes with mild edema      Genelle Bal   02/05/2022 1:21 PM      X-ray chest AP portable (daily)    (Results Pending)       I have spent 75 minutes in the care of this patient.    Marijean Niemann, MD  Critical Care, [facility]

## 2022-02-06 NOTE — OT Eval Note (Addendum)
Occupational Therapy Eval Erik [name]        Post Acute Care Therapy Recommendations:     Discharge Recommendations:  Home with supervision, Home with home health OT  DME needs IF patient is discharging home: Front wheel walker, Shower chair    Therapy discharge recommendations may change with patient status.  Please refer to most recent note for up-to-date recommendations.      Assessment:   Significant Findings: None    Erik [name] is a 46 y.o. male admitted 02/05/2022. POD#1 CABG X3. (LIMA-LAD, SVG-PDA, L Radial-OM1), PFO closure .   Pt educated in Move in the Tube and  cues to adhere to them.  Pt is supine to sit Min A, sit to stand Min A and takes a few steps to chair with Min A, unsteady and impulsive, chair alarm activated and family at side.   + dizzy in sitting and diaphoretic, VSS ( see below) Pt reporting "100"  of 10 for pain , RN at side an aware. Pt was independent at baseline and lives alone, he has worked in [employer] and will have his family to stay with him upon [place] home.  Pt presents with acute functional decline with ADLS and Mobility for self care with decreased strength, decreased activity tolerance, decreased balance and is at risk for falls. Pt will benefit from OT to maximize Independence with ADLS and mobility for self care.       Therapy Diagnosis: decrease ADL S     Rehabilitation Potential: Good    Treatment Activities: Eval,ADLS, therapeutic activity, therapeutic exercise, functional mobility for self care and skilled instruction     Educated the patient to role of occupational therapy, plan of care, goals of therapy and HEP, safety with mobility and ADLs, energy conservation techniques, MOVE IN THE TUBE sternal precautions, discharge instructions, home safety.    Plan:   OT Frequency Recommended: 4-5x/wk     Treatment/Interventions: ADLS, therapeutic activity, therapeutic exercise, functional mobility for self care and skilled instruction     Risks/benefits/POC discussed  yes      [facility]  Bed: FI201/FI201-01        Precautions and Contraindications:   Falls  Move in the Tube     HOH-Hearing loss L ear   Other : h/o TBI, also needs f/u pending-might be CIWA     Consult received for Erik [name] for OT Evaluation and Treatment.  Patient's medical condition is appropriate for Occupational Therapy intervention at this time.      History of Present Illness:    Erik [name] is a 46 y.o. male admitted on 02/05/2022 w/ PmHx of HTN, HLD, CAD, hyponatremia, smoker, Heavy ETOH use, depression, TBI, who was recently evaluated in Dec 22' w/ c/o of worsening exertional angina/CP, LHC 01/08/22 revealed 3 vessel disease CAD, w/ 100% prox CTO of RCA. On 02/05/22 he was brought to the OR for elective CABG.  POD#: 1     02/05/22:  Elective, CABG x 3 (LIMA-LAD, SVG-PDA, L Radial-OM1), PFO closure             Surgeon: Austin Miles, MD             From OR: Gtts: Levo @ 1 ; Products: NONE ; Rhythm: Sinus Rhythm 60s     LVEF: 50%%, RVEF NORMAL      Admitting Diagnosis: Coronary artery disease involving native coronary artery of native heart without angina pectoris [I25.10]  Pre-operative cardiovascular  examination [Z01.810]    Past Medical/Surgical History:  Past Medical History:   Diagnosis Date    Chest pain 12/10/2021    Coronary artery disease involving native coronary artery of native heart without angina pectoris 02/04/2022    pre op dx    COVID 09/2021    11/2021: asymptomatic    Hearing loss in Left ear     History of echocardiogram 10/05/2021    EF 54    History of nuclear stress test: exercise SPECT abn 11/22/2021    Hyperlipidemia     on Rx    Hyponatremia     TBI (traumatic brain injury) 1999        Imaging/Tests/Labs:  X-ray chest AP portable (daily)    Result Date: 02/06/2022   1.Tubes and lines as above. 2.Residual left basilar atelectasis. 3.Mild vascular congestion. Erik StallYibo Chen 02/06/2022 8:33 AM    XR Chest AP Portable    Result Date: 02/05/2022  1.  Increased opacity along the left superior mediastinum, partially obscuring the aortic knob. Mediastinal hemorrhage not excluded. Chest CT recommended for further evaluation. COMMENT: This urgent result was discussed with and acknowledged by Erik FractionMelissa Anderson, NP at 1905 hours on 02/05/2022. 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 Family and/or guardian are agreeable to patient's participation in the therapy session. Nursing clears patient for therapy.     Patient Goal: walk  Pain:   Scale: "100" of 10 at start  and then "10 " of 10 at end of session    Location: chest and shoulder   Intervention: repositioned       Objective:   Patient is in bed with peripheral IV, chest tube, external pacing wires, monitors and lines in place.  Pt wore mask during therapy session:No      Vitals :  Supine                      117/69  HR 84  SpO2 99%  Sitting                       115/74    HR 102   Sitting   after stand   133/65  HR 92     Cognitive Status and Neuro Exam:  Alert and O,  Impulsive       Musculoskeletal Examination  RUE ROM: WFL   LUE ROM: WFL   RLE ROM: WFL   LLE ROM: WFL     RUE Strength: WFL   LUE Strength: WFL   RLE Strength: WFL   LLE  Strength: WFL        Sensory/Oculomotor Examination  Auditory: HOH , L ear hearing loss  Tactile: SILT r/u/m   Vision: intact scans L and  R      Activities of Daily Living  Eating: I hand to mouth  Grooming: S sitting  Bathing: NT  UE Dressing: NT  LE Dressing: Max  A  Toileting: NT    Functional Mobility:  Supine to Sit: Min A  Sit to Stand: Min A  Transfers: Min A    PMP Activity: Step 7 - Walks out of Room      Balance  Static Sitting: Good  Dynamic Sitting: Good  Static Standing: Fair +  Dynamic Standing: Fair + unsteady HHA X2      Participation and Activity Tolerance  Participation Effort: Good  Endurance: Good , VSS     Patient left with call bell within reach, all needs met, SCDs off, fall mat in place, bed alarm na, chair alarm ON , RN at side and all questions answered. RN notified of session outcome and patient response.       Goals:  Time For Goal Achievement: 5 visits  ADL Goals  Patient will groom self: Supervision, at sinkside, 5 visits  Patient will dress upper body: Supervision, 5 visits  Patient will dress lower body: Supervision, 5 visits  Patient will toilet: Supervision, 5 visits  Mobility and Transfer Goals  Pt will perform functional transfers: Supervision, 5 visits        Executive Fucntion Goals  Pt will follow sternal precautions: with supervision, 5 visits (Move in the Tube)                  PPE worn during session: procedural mask and gloves    Tech present: No  PPE worn by tech: N/A        Thana Farr, OTR/L       Time of treatment:   OT Received On: 02/06/22  Start Time: 1510  Stop Time: 1535  Time Calculation (min): 25 min

## 2022-02-06 NOTE — OT Progress Note (Signed)
Occupational Therapy Cancellation Note    Patient: Erik [name]  UJW:[id number]    Unit: FI201/FI201-01    Patient not seen for occupational therapy secondary to Pt is on bedrest , spoke with RN. OT will continue to follow and see later today as schedule permits.            Thana Farr, OTR/L

## 2022-02-06 NOTE — Plan of Care (Signed)
Patient: Erik [name], Erik [name]   Date: 02/06/2022  LOS: 1  Attending: Austin Miles, MD  APP: Dionicio Stall care of patient at: 02/06/22 @7am     Review of Systems    Neuro/Activity: A&Ox4; moves all extremities;  c/o constant pain worse with movement, needs encouragement to increase activity;  able to dangle, sit, stand with SBA x2;  easily frustrated;      States last drink was Tues night prior to surg Wed AM, drinks upto 24beers/day; CIWA performed;      CV: no orthostatic changes when OOB;  pacer set to AAI backup 60;  native rhythm NSR 80's to low 100's with activity    Respiratory: weaned to room air;  Chest tubes x3,  140 sanguinous d/c when dangled/stood;  provider aware;  I/S only to 250, needs freq encouragement to use;  nicotine patch applied - pt states he smokes 2 pk cigarettes day    GI: hypoactive BS, passing gas; tolerating regular cardiac diet    GU: amber urine, foley d/c 11am;  bladder scan 1800 showed 305 urine;  pt encouraged to increase po intake, denies need to urinate, will reassess;      Skin: see flow sheets    ID: [id number], encourage I/S use;    Dispo/Plan: stepdown when room available  Problem: Moderate/High Fall Risk Score >5  Goal: Patient will remain free of falls  Outcome: Progressing  Flowsheets (Taken 02/06/2022 0800)  Moderate Risk (6-13):   MOD-Apply bed exit alarm if patient is confused   MOD-Floor mat at bedside (where available) if appropriate   MOD-Perform dangle, stand, walk (DSW) prior to mobilization     Problem: Post-op Phase - Cardiac Surgery  Goal: Effective breathing pattern is maintained  Outcome: Progressing  Flowsheets (Taken 02/05/2022 1951)  Effective breathing pattern is maintained:   Position patient for maximum ventilatory efficiency with HOB to a minimum of 30 degrees when hemodynamically stable   Maintain SpO2 level per LIP order   Reinforce use of ordered respiratory interventions (i.e. CPAP, BiPAP, Incentive Spirometer, Acapella)   Monitor for sleep apnea    Monitor for medication-induced respiratory depression  Goal: Patient will remain free from post-op complications  Outcome: Progressing  Flowsheets (Taken 02/05/2022 1951)  Patient will remain free from post-op complications:   Maintain sternal precautions as described in patient education   Maintain pericare/Foley care per protocol   Discontinue Foley within 24 hours unless otherwise ordered [facility])   Monitor/assess blood glucose per LIP orders   VTE prevention: Administer anticoagulants and/or apply anti-embolism stockings/devices as ordered   Assess dressing and reinforce or change per LIP order   Assess surgical incision/wound site and treat per LIP order  Goal: Mobility/activity is maintained at optimal level  Outcome: Progressing  Flowsheets (Taken 02/06/2022 1143)  Mobility/activity is maintained at optimal level:   Increase mobility as tolerated/progressive mobility   Get patient OOB to chair after 4-8 hours of extubation   Get patient OOB to chair for meals   Encourage independent activity per ability. Reinforce sternal precautions.   Plan activities to conserve energy. Plan rest periods.  Goal: Nutritional intake is adequate  Outcome: Progressing  Flowsheets (Taken 02/06/2022 1143)  Nutritional intake is adequate:   Monitor daily weights   Encourage/perform oral hygiene as appropriate   Include patient/patient care companion in decisions related to nutrition     Problem: Compromised Tissue integrity  Goal: Damaged tissue is healing and protected  Outcome: Progressing  Flowsheets (Taken 02/06/2022 1143)  Damaged tissue is healing and protected:   Monitor/assess Braden scale every shift   Provide wound care per wound care algorithm   Reposition patient every 2 hours and as needed unless able to reposition self   Increase activity as tolerated/progressive mobility   Relieve pressure to bony prominences for patients at moderate and high risk   Avoid shearing injuries   Keep intact skin clean and dry   Use bath  wipes, not soap and water, for daily bathing  Goal: Nutritional status is improving  Outcome: Progressing

## 2022-02-06 NOTE — UM Notes (Signed)
Initial Inpatient Review 02/06/22:    PATIENT NAME: Erik [name], Erik [name]   DOB: 28-Oct-1975     Admission Order   02/05/22 1149  Admit to Inpatient  Once        Diagnosis: Pre-Operative Cardiovascular Examination         Admit Diagnosis: Coronary artery disease involving native coronary artery of native heart without angina pectoris [I25.10]  Pre-operative cardiovascular examination [Z01.810]  [facility]ility[facility]    Reason for Admission   46 year old male admitted to [facility] as inpatient on 4/26 for a cabg.  Surgery successfully completed on 4/26 without complication.  Patient recovering on CVICU for post op hemodynamic monitoring.     Vital Signs  Temp 98.6  HR 89  RR 41  85/56  97% on 2 liters    Abnormal Labs  Sodium 128    Op Note  Procedure Date: 02/05/2022     Patient Type: I     SURGEON: Austin Miles, MD     PREOPERATIVE DIAGNOSES:  1.  Coronary artery disease.  2.  Anxiety.  3.  Chronic obstructive pulmonary disease.     POSTOPERATIVE DIAGNOSES:  1.  Coronary artery disease.  2.  Anxiety.  3.  Chronic obstructive pulmonary disease.     TITLE OF PROCEDURES:  1.  Coronary artery bypass graft x3 (left internal mammary artery to distal  left anterior descending, saphenous vein graft to posterior descending   artery, radial to obtuse marginal 1).  2.  Patent foramen ovale closure.  3.  Left upper extremity endoscopic radial harvest.  4.  Left lower extremity endoscopic vein harvest.     CARDIOPULMONARY BYPASS TIME:  1 hour and 46 minutes.     CROSSCLAMP TIME:  1 hour and 30 minutes.     COMPLICATIONS:  Nil.     DISPOSITION:  CVICU.     CONDITION:  Critical, but stable.     ANESTHESIA:  General.     ESTIMATED BLOOD LOSS:  50 mL     DRAINS:  Three times 24-French Blake's, 1 in left chest, other 2 mediastinal.     SPECIMENS:  Nil.     FINDINGS:  1.  LAD and PDA 1.75 mm.  2.  OM1 1.25 mm and thick.  3.  Conduits good.  4.  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  No   residual  PFO or septal aneurysm postoperatively.  5.  Thin aorta.  6.  Bicuspid aortic valve with well-functioning valve and no AI or AS.  No   aortic root or ascending aortic aneurysm.     All counts correct at the end of the case.  There were no immediate   postoperative complications and I was present throughout the entire   procedure.     INDICATIONS FOR PROCEDURE:  This 46 year old gentleman with known multivessel coronary artery disease   as well as anxiety was not deemed to be a suitable surgical candidate due   to the inadequate stenosis of the LAD.  He had an iFR and FFR that showed   borderline stenosis.  However, the patient kept having significant symptoms  and hence after a lengthy conversation, we decided to offer him bypass   surgery.  I had a lengthy conversation with the patient and his mother   about alternatives, indications, risks, benefits of the operation.  They   had all their questions answered and agreed to proceed.     DESCRIPTION OF  PROCEDURE:  After full informed consent was obtained, the patient was brought to the   operating room where he underwent general anesthesia with endotracheal tube  intubation.  Substandard lines were placed by the anesthesia team.  He was   then positioned, prepped and draped in the usual fashion.     TEE did find bicuspid aortic valve that was well-functioning with no AI or   AS. There was also no aortic root or ascending aortic aneurysm.  However,   the TEE did find a large PFO with a significant interatrial septal   aneurysm.  Hence, I decided to repair this during the procedure.     Left lower extremity endoscopic vein harvest was performed.  The leg was   wrapped at the end of the procedure.     Left upper extremity endoscopic radial harvest was performed.  The forearm   was then wrapped and tucked.     Midline sternotomy was performed.  LIMA was taken down as a pedicle from   the origin to the bifurcation.  The patient was fully heparinized, and LIMA  was divided  distally where there was excellent pulsatile blood flow.     Pericardial well was made in a standard fashion.  Cannulas were then   placed, starting with the aortic cannula, SVC cannula, dual-stage venous   cannula via the right atrial appendage, retrograde coronary sinus cannula   and finally the antegrade/root vent cannula was placed.  When the ACT was   appropriate, the patient underwent full cardiopulmonary bypass.    Cross-clamp was placed and 1 liter of cold antegrade blood cardioplegia was  given.  There was good diastolic arrest with electrochemical silence.    Topical cooling was also used and this was followed by 1 liter of cold   retrograde blood cardioplegia.  Throughout the procedure at about 15-minute  intervals, cold retrograde blood cardioplegia was given.     Dual stage venous cannula was advanced into the IVC.  Cable tapes were   snared.  Transverse right atriotomy was performed.  There was indeed a   large PFO with multiple fenestrations and a patulous-appearing interatrial   septum.  The PFO was closed with 2 layers of running 4-0 polypropylene   suture incorporating healthy tissue of the septum in essence, excluding the  aneurysm as well.  The right atriotomy was closed with 2 layers of running   4-0 polypropylene suture and the caval tapes removed.  The dual stage   venous cannula was brought back into its normal position and the SVC   cannula removed.     Inferior wall was inspected. PDA was identified as a suitable target.  An   arteriotomy was performed here and an end-to-side vein graft to coronary   artery anastomosis was performed which tested well with the hand flush and   sized to length.  I did the same for the OM1 with the radial artery.    Finally, the distal LAD was opened and an end-to-side LIMA to LAD   anastomosis was done with running 7-0 polypropylene suture in a   circumferential manner. When the bulldog clamp was removed off the LIMA   pedicle, the epicardial vessels filled  instantly with blood.     Both proximal aortocoronary anastomoses were done consecutively in an   end-to-side fashion and marked with metal clips.       Plan of Care  Neurological?:  Pain management: Pain is well controlled. on standard regimen  Chronic Depression   Hx of TBI         Cardiovascular:  CAD/Multivessel, s/p CABG  HTN  HLD     Pulmonary:  No issues     Gastrointestinal:  No acute issues      Renal/GU:  Volume overload   Moderate hyponatremia      Infectious Disease:  Clinically, no acute issue        Hematological:  Acute Blood Loss Anemia, and Coagulopathy as expected from surgery   Thrombocytopenia         Endocrinological:  Non DM, Stress Hyperglycemia controlled on insulin gtt per protocol  HgbA1C: 5.1     Medications  Current Facility-Administered Medications   Medication Dose Route Frequency Last Rate Last Admin    0.9% NaCl infusion   Intravenous Continuous 85 mL/hr at 02/06/22 1100 Rate Verify at 02/06/22 1100    acetaminophen (TYLENOL) tablet 1,000 mg  1,000 mg Oral 4 times per day   1,000 mg at 02/06/22 1212    [START ON 02/10/2022] acetaminophen (TYLENOL) tablet 650 mg  650 mg Oral Q4H PRN        albuterol (PROVENTIL) (2.5 MG/3ML) 0.083% nebulizer solution 2.5 mg  2.5 mg Nebulization Q6H PRN        amiodarone (PACERONE) tablet 200 mg  200 mg Oral Q12H SCH   200 mg at 02/06/22 0905    aspirin EC tablet 81 mg  81 mg Oral Daily   81 mg at 02/06/22 0905    atorvastatin (LIPITOR) tablet 40 mg  40 mg Oral QHS        bisacodyl (DULCOLAX) suppository 10 mg  10 mg Rectal Daily PRN        chlorhexidine (PERIDEX) 0.12 % solution 15 mL  15 mL Mouth/Throat Q12H SCH   15 mL at 02/06/22 0903    dexmedeTOMIDine (PRECEDEX) 400 mcg in sodium chloride 0.9 % 100 mL infusion (premix)  0.1-0.5 mcg/kg/hr Intravenous Continuous   Stopped at 02/05/22 2245    dextrose (GLUCOSE) 40 % oral gel 15 g of glucose  15 g of glucose Oral PRN        Or    dextrose (D10W) 10% bolus 125 mL  12.5 g Intravenous PRN        Or     dextrose 50 % bolus 12.5 g  12.5 g Intravenous PRN        Or    glucagon (rDNA) (GLUCAGEN) injection 1 mg  1 mg Intramuscular PRN        famotidine (PEPCID) tablet 20 mg  20 mg Oral Q12H SCH   20 mg at 02/06/22 0905    folic acid (FOLVITE) tablet 1 mg  1 mg Oral Daily   1 mg at 02/06/22 1048    gabapentin (NEURONTIN) capsule 300 mg  300 mg Oral Q8H SCH        hydrALAZINE (APRESOLINE) injection 10 mg  10 mg Intravenous Q30 Min PRN        insulin lispro injection 1-3 Units  1-3 Units Subcutaneous QHS        insulin lispro injection 1-5 Units  1-5 Units Subcutaneous TID AC        lactated ringers bolus 250 mL  250 mL Intravenous PRN        lidocaine (LMX) 4 % cream   Topical Once PRN        lidocaine (XYLOCAINE) 1 % injection 1 mL  1 mL Intradermal Once PRN  LORazepam (ATIVAN) injection 1-4 mg  1-4 mg Intravenous Q1H PRN        magnesium oxide (MAG-OX) tablet 400-800 mg  400-800 mg Oral PRN        Or    magnesium sulfate 1g in dextrose 5% IVPB (premix)  1 g Intravenous PRN        metoprolol tartrate (LOPRESSOR) tablet 12.5 mg  12.5 mg Oral Q12H SCH   12.5 mg at 02/06/22 0904    multivitamin tablet 1 tablet  1 tablet Oral Daily   1 tablet at 02/06/22 1048    mupirocin (BACTROBAN NASAL) 2 % ointment   Nasal Q12H SCH   0.9 each at 02/06/22 0903    niCARdipine (CARDENE) 25 mg in sodium chloride 0.9 % 100 mL infusion mini-bag plus  0-15 mg/hr Intravenous Continuous PRN        nicotine (NICODERM CQ) 14 MG/24HR patch 1 patch  1 patch Transdermal Daily   1 patch at 02/06/22 1048    nitroglycerin (TRIDIL) 200 mcg/mL in dextrose 5% 250 mL infusion  10-200 mcg/min Intravenous Continuous PRN        norepinephrine (LEVOPHED) 8 mg in dextrose 5% 250 mL infusion  1-20 mcg/min Intravenous Continuous PRN   Stopped at 02/05/22 1520    ondansetron (ZOFRAN-ODT) disintegrating tablet 4 mg  4 mg Oral Q8H PRN        Or    ondansetron (ZOFRAN) injection 4 mg  4 mg Intravenous Q8H PRN        oxyCODONE (ROXICODONE) immediate release  tablet 5 mg  5 mg Oral Q6H PRN   5 mg at 02/06/22 1213    polyethylene glycol (MIRALAX) packet 17 g  17 g Oral Daily        potassium chloride (KLOR-CON M20) CR tablet 0-60 mEq  0-60 mEq Oral PRN        Or    potassium chloride (KLOR-CON) packet 0-60 mEq  0-60 mEq Oral PRN        Or    potassium chloride 20 mEq in 50 mL IVPB (premix)  20 mEq Intravenous PRN        senna-docusate (PERICOLACE) 8.6-50 MG per tablet 2 tablet  2 tablet Oral Q12H [facility]   2 tablet at 02/05/22 2045    sodium phosphates 15 mmol in dextrose 5 % 250 mL IVPB  15 mmol Intravenous PRN        sodium phosphates 25 mmol in dextrose 5 % 250 mL IVPB  25 mmol Intravenous PRN        sodium phosphates 35 mmol in dextrose 5 % 250 mL IVPB  35 mmol Intravenous PRN        thiamine (VITAMIN B1) tablet 200 mg  200 mg Oral Daily   200 mg at 02/06/22 1048    tiotropium (SPIRIVA RESPIMAT) 2.5 MCG/ACT inhalation spray 1 puff  1 puff Inhalation QAM   1 puff at 02/06/22 1215    traMADol (ULTRAM) tablet 50 mg  50 mg Oral Q4H PRN   50 mg at 02/06/22 0919       Primary Coverage:  Payor: [employer] / Plan: [employer] BC[employer] PPO / Product Type: BCBS /         UTILIZATION REVIEW CONTACT: Name: Diona Browner, RN, CM  Clinical Case Manager  - Utilization Review  [facility]  Address:  [address] [place], [place]  [address]  NPI:   [id number]  Tax ID:  [id number]  Direct: [phone] (  Voicemail only)  Main: [phone]  Fax: [phone]  Email: [email or web address]     Please use fax number [phone] to provide authorization for hospital services or to request additional information.        NOTES TO REVIEWER:    This clinical review is based on/compiled from documentation provided by the treatment team within the patient's medical record.

## 2022-02-06 NOTE — Anesthesia Postprocedure Evaluation (Signed)
Anesthesia Post Evaluation    Patient: Erik [name]    Procedure(s):  CORONARY ARTERY BYPASS X 3 ; LIMA  ENDOSCOPIC,VEIN HARVEST  ENDOSCOPY, HARVEST RADIAL ARTERY  ECHOCARDIOGRAM, TRANSESOPHAGEAL  CLOSURE, PATENT FORAMEN OVALE (OPEN)    Anesthesia type: general    Last Vitals:   Vitals Value Taken Time   BP 114/70 02/06/22 1500   Temp 37.1 C (98.8 F) 02/06/22 1200   Pulse 83 02/06/22 1500   Resp 22 02/06/22 1500   SpO2 99 % 02/06/22 1500                 Anesthesia Post Evaluation:     Patient Evaluated: ICU  Patient Participation: complete - patient participated  Level of Consciousness: awake and alert  Pain Score: 3  Pain Management: adequate    Airway Patency: patent    Anesthetic complications: No      PONV Status: none    Cardiovascular status: acceptable  Respiratory status: acceptable  Hydration status: acceptable        Signed by: Barbara Cower, MD, 02/06/2022 3:38 PM

## 2022-02-07 ENCOUNTER — Inpatient Hospital Stay: Payer: Commercial Managed Care - POS

## 2022-02-07 LAB — CBC
Absolute NRBC: 0 10*3/uL (ref 0.00–0.00)
Hematocrit: 23.6 % — ABNORMAL LOW (ref 37.6–49.6)
Hgb: 8.2 g/dL — ABNORMAL LOW (ref 12.5–17.1)
MCH: 33.1 pg (ref 25.1–33.5)
MCHC: 34.7 g/dL (ref 31.5–35.8)
MCV: 95.2 fL (ref 78.0–96.0)
MPV: 9.5 fL (ref 8.9–12.5)
Nucleated RBC: 0 /100 WBC (ref 0.0–0.0)
Platelets: 137 10*3/uL — ABNORMAL LOW (ref 142–346)
RBC: 2.48 10*6/uL — ABNORMAL LOW (ref 4.20–5.90)
RDW: 13 % (ref 11–15)
WBC: 7.31 10*3/uL (ref 3.10–9.50)

## 2022-02-07 LAB — BASIC METABOLIC PANEL
Anion Gap: 5 (ref 5.0–15.0)
BUN: 8 mg/dL — ABNORMAL LOW (ref 9.0–28.0)
CO2: 26 mEq/L (ref 17–29)
Calcium: 8.2 mg/dL — ABNORMAL LOW (ref 8.5–10.5)
Chloride: 98 mEq/L — ABNORMAL LOW (ref 99–111)
Creatinine: 0.8 mg/dL (ref 0.5–1.5)
Glucose: 99 mg/dL (ref 70–100)
Potassium: 4 mEq/L (ref 3.5–5.3)
Sodium: 129 mEq/L — ABNORMAL LOW (ref 135–145)

## 2022-02-07 LAB — ECG 12-LEAD
Atrial Rate: 71 {beats}/min
IHS MUSE NARRATIVE AND IMPRESSION: NORMAL
P-R Interval: 170 ms
Q-T Interval: 396 ms
QTC Calculation (Bezet): 430 ms
R Axis: 82 degrees
T Axis: -15 degrees
Ventricular Rate: 71 {beats}/min

## 2022-02-07 LAB — GLUCOSE WHOLE BLOOD - POCT
Whole Blood Glucose POCT: 146 mg/dL — ABNORMAL HIGH (ref 70–100)
Whole Blood Glucose POCT: 107 mg/dL — ABNORMAL HIGH (ref 70–100)

## 2022-02-07 LAB — GFR: EGFR: >60

## 2022-02-07 LAB — PHOSPHORUS: Phosphorus: 2.3 mg/dL (ref 2.3–4.7)

## 2022-02-07 LAB — MAGNESIUM: Magnesium: 1.7 mg/dL (ref 1.6–2.6)

## 2022-02-07 MED ORDER — POLYETHYLENE GLYCOL 3350 17 G PO PACK
17.0000 g | PACK | Freq: Every day | ORAL | Status: DC
Start: 2022-02-07 — End: 2022-02-08
  Administered 2022-02-07 – 2022-02-08 (×2): 17 g via ORAL
  Filled 2022-02-07 (×2): qty 1

## 2022-02-07 MED ORDER — FAMOTIDINE 20 MG PO TABS
20.0000 mg | ORAL_TABLET | Freq: Two times a day (BID) | ORAL | Status: DC
Start: 2022-02-07 — End: 2022-02-08
  Administered 2022-02-07 – 2022-02-08 (×3): 20 mg via ORAL
  Filled 2022-02-07 (×3): qty 1

## 2022-02-07 MED ORDER — ACETAMINOPHEN 325 MG PO TABS
650.0000 mg | ORAL_TABLET | ORAL | Status: DC | PRN
Start: 2022-02-11 — End: 2022-02-10

## 2022-02-07 MED ORDER — SENNOSIDES-DOCUSATE SODIUM 8.6-50 MG PO TABS
2.0000 | ORAL_TABLET | Freq: Two times a day (BID) | ORAL | Status: DC
Start: 2022-02-07 — End: 2022-02-10
  Administered 2022-02-07 – 2022-02-09 (×5): 2 via ORAL
  Filled 2022-02-07 (×5): qty 2

## 2022-02-07 MED ORDER — ONDANSETRON HCL 4 MG/2ML IJ SOLN
4.0000 mg | Freq: Every day | INTRAMUSCULAR | Status: DC | PRN
Start: 2022-02-07 — End: 2022-02-10

## 2022-02-07 MED ORDER — ATORVASTATIN CALCIUM 40 MG PO TABS
40.0000 mg | ORAL_TABLET | Freq: Every evening | ORAL | Status: DC
Start: 2022-02-07 — End: 2022-02-10
  Administered 2022-02-07 – 2022-02-09 (×3): 40 mg via ORAL
  Filled 2022-02-07 (×3): qty 1

## 2022-02-07 MED ORDER — NICOTINE 14 MG/24HR TD PT24
1.0000 | MEDICATED_PATCH | Freq: Every day | TRANSDERMAL | Status: DC
Start: 2022-02-07 — End: 2022-02-10
  Administered 2022-02-07 – 2022-02-10 (×4): 1 via TRANSDERMAL
  Filled 2022-02-07 (×5): qty 1

## 2022-02-07 MED ORDER — ASPIRIN 81 MG PO TBEC
81.0000 mg | DELAYED_RELEASE_TABLET | Freq: Every day | ORAL | Status: DC
Start: 2022-02-07 — End: 2022-02-10
  Administered 2022-02-07 – 2022-02-10 (×4): 81 mg via ORAL
  Filled 2022-02-07 (×4): qty 1

## 2022-02-07 MED ORDER — HEPARIN SODIUM (PORCINE) 5000 UNIT/ML IJ SOLN
5000.0000 [IU] | Freq: Three times a day (TID) | INTRAMUSCULAR | Status: DC
Start: 2022-02-07 — End: 2022-02-07

## 2022-02-07 MED ORDER — ENOXAPARIN SODIUM 40 MG/0.4ML IJ SOSY
40.0000 mg | PREFILLED_SYRINGE | INTRAMUSCULAR | Status: DC
Start: 2022-02-07 — End: 2022-02-10
  Administered 2022-02-07 – 2022-02-09 (×3): 40 mg via SUBCUTANEOUS
  Filled 2022-02-07 (×3): qty 0.4

## 2022-02-07 MED ORDER — ACETAMINOPHEN 500 MG PO TABS
1000.0000 mg | ORAL_TABLET | Freq: Four times a day (QID) | ORAL | Status: DC
Start: 2022-02-07 — End: 2022-02-10
  Administered 2022-02-07 – 2022-02-10 (×11): 1000 mg via ORAL
  Filled 2022-02-07 (×12): qty 2

## 2022-02-07 MED ORDER — FUROSEMIDE 10 MG/ML IJ SOLN
20.0000 mg | Freq: Once | INTRAMUSCULAR | Status: AC
Start: 2022-02-07 — End: 2022-02-07
  Administered 2022-02-07: 20 mg via INTRAVENOUS
  Filled 2022-02-07: qty 4

## 2022-02-07 MED ORDER — TAMSULOSIN HCL 0.4 MG PO CAPS
0.4000 mg | ORAL_CAPSULE | Freq: Every day | ORAL | Status: DC
Start: 2022-02-07 — End: 2022-02-10
  Administered 2022-02-07 – 2022-02-09 (×3): 0.4 mg via ORAL
  Filled 2022-02-07 (×3): qty 1

## 2022-02-07 MED ORDER — HYDROMORPHONE HCL 0.5 MG/0.5 ML IJ SOLN
0.4000 mg | Freq: Once | INTRAMUSCULAR | Status: AC
Start: 2022-02-07 — End: 2022-02-07
  Administered 2022-02-07: 0.4 mg via INTRAVENOUS
  Filled 2022-02-07: qty 1

## 2022-02-07 MED ORDER — TRAMADOL HCL 50 MG PO TABS
50.0000 mg | ORAL_TABLET | ORAL | Status: DC | PRN
Start: 2022-02-07 — End: 2022-02-10
  Administered 2022-02-07 – 2022-02-10 (×9): 50 mg via ORAL
  Filled 2022-02-07 (×9): qty 1

## 2022-02-07 NOTE — Progress Notes (Signed)
Initial Case Management Assessment and Discharge Planning  [facility]   Patient Name: MIRO, BALDERSON   Date of Birth 08/27/1976   Attending Physician: Austin Miles, MD   Primary Care Physician: Pcp, None, MD   Length of Stay 2   Reason for Consult / Chief Complaint Elective, CABG         Situation   Admission DX:   1. Pre-operative cardiovascular examination    2. Abnormal exercise myocardial perfusion study    3. Coronary artery disease, unspecified vessel or lesion type, unspecified whether angina present, unspecified whether native or transplanted heart        A/O Status: X 3    LACE Score:     Patient admitted from: direct  Admission Status: inpatient    Health Care Agent: Self  Name: [phone]  Phone number:        Background     Advanced directive:   <no information>    has an advance directive - a copy HAS NOT been provided. Requested to provide copy    Code Status:   Full Code     Residence: One story home/apartment    PCP: PCP None, MD  Patient Contact:   [phone] (home)     [phone] (mobile)     Emergency contact:   Extended Emergency Contact Information  Primary Emergency Contact: Snow,Margaret  Mobile Phone: [phone]  Relation: Mother  Preferred language: English  Interpreter needed? No      ADL/IADL's: Independent  Previous Level of function: 7 Independent     DME: None    Pharmacy:     [id number] - [place], [place] - [address]  [address] [place] [address]  Phone: [phone] Fax: [phone]      Prescription Coverage: Yes    Home Health: The patient is not currently receiving home health services.    Previous SNF/AR:   no  COVID Vaccine Status: No    Date First IMM given: na  UAI on file?: No  Transport for discharge? Mode of transportation: Sales executive - Family/Friend to drive patient  Agreeable to Home with family post-discharge:  Yes     Assessment   Assessment completed with pt and his mother at bedside.  Pt lives  alone in an apartment with no steps.  Pt was independent, self care and not using DMEs at home.  Pt has no PCP, was in agreement to find one and let Cm know if he needs help to set up a follow up.  Demographics and insurance were verified.  BARRIERS TO DISCHARGE: none identified at this time.     Recommendation   D/C Plan A: Home with family    D/C Plan B: Pt's mom to provide transportation.    D/C Plan C:           Christiana Pellant RN BSN  Case Manager   [facility]   [facility]   [address]   [place], [place] [address]   T [phone]

## 2022-02-07 NOTE — Progress Notes (Signed)
Cardiac Surgery Progress Note  Date/Time:  02/08/22 3:00 PM   Patient Name:  Erik [name] Age: 46 y.o.   Room:  FI264/FI264-01     Operative Profile:   POD#: 3    02/05/22:  Elective, CABG x 3 (LIMA-LAD, SVG-PDA, L Radial-OM1), PFO closure             Surgeon: Austin Miles, MD             From OR: Gtts: Levo @ 1 ; Products: NONE ; Rhythm: Sinus Rhythm 60s    LVEF: 50%%, RVEF NORMAL  Cardiologist: Trellis Moment, MD, [facility]    Synopsis   Indication for Surgery: Progressive angina, multivessel CAD    Preop Course: 46 y/o male w/ PmHx of HTN, HLD, CAD, hyponatremia, smoker, Heavy ETOH use, depression, TBI, who was recently evaluated in Dec 22' w/ c/o of worsening exertional angina/CP, LHC 01/08/22 revealed 3 vessel disease CAD, w/ 100% prox CTO of RCA. On 02/05/22 he was brought to the OR for elective CABG.    PMH:  has a past medical history of Chest pain (12/10/2021), Coronary artery disease involving native coronary artery of native heart without angina pectoris (02/04/2022), COVID (09/2021), Hearing loss in Left ear, History of echocardiogram (10/05/2021), History of nuclear stress test: exercise SPECT abn (11/22/2021), Hyperlipidemia, Hyponatremia, and TBI (traumatic brain injury) (1999).    Daily Summary:  4/26. SDA--> OR, chest tubes oozing, required 4 PLT, 4 Cryoprecipitate, extubated    4/27: Orthostatic, d/c BB, started on CIWA, zoloft  4/28: Transferred to SDU. Start Diuresis w/ lasix . Foley replaced for retention, on Flomax  4/29: No further orthostasis, give lasix 20 IV x1. Hyponatremic, Na 129->122->126. 500 cc FWR. PW grounded    Plan:   Remains in CVSDU for post operative management, hyponatremia, CT draining  -  Monitor chest tubes, likely remove tomorrow if drainage minimal  -  Monitor blood pressures; previously orthostatic- now resolved. Encourage PO intake  -  Gentle diuresis; remove CT when able  -  Monitor for ETOH withdrawal, continue CIWA  -  Likely  COPD, Spirivia and PRN duonebs  - Will need DAPT, once PW removed    Standard Events:     -    Extubated  POD #0   -    Transferred to SDU POD # 2   -    Removed CTs POD # ___   -    Removed PW POD # ___ by *ing             System Assessment of Active Issues     Neurological?:  Pain management: Pain poorly controlled, increased gabapentin  Chronic Depression, restarted on zoloft   Heavy ETOH use, last drink 4/25, asymptomatic, on CIWA protocol  Nicotine dependence: no replacement due to risk of coronary graft spasm; smoking cessation reinforced  Hx of TBI, no baseline deficits      Cardiovascular:  CAD/Multivessel, s/p CABG, stable/improving, continue ASA, Statin, start plavix once PW removed  HTN, controlled no medications, resume low dose BB   A.fib ppx: Continue Amiodarone   Orthostatic Hypotension: Resolved   HLD, Continue statin    Pulmonary:  Mild COPD/Current Smoker/20 pack year smoker, mild DOE, Started spiriva 4/27, continue w/ PRN duonebs     Gastrointestinal:  No acute issues     Renal/GU:  Volume overload, improving autodiuresing  Moderate hyponatremia, acute on chronic, asymptomatic baseline upper 120's, avoid free water, trend w/ outpatient f/u w/ PCP, consider  nephrology consult if worsens     Infectious Disease:  Clinically, no acute issue      Hematological:  Acute Blood Loss Anemia, and Coagulopathy as expected from surgery, H &H stable   Thrombocytopenia, tolerating low dose ASA, trend closely during hospital course      Endocrinological:  Non DM, Stress Hyperglycemia controlled on insulin SSI ACHS  HgbA1C: 5.1   Lab Results   Component Value Date    HGBA1C 5.1 01/28/2022       Subjective:   Offers no complaints    Physical Exam:   BP 113/70   Pulse 84   Temp 98.4 F (36.9 C) (Oral)   Resp 16   Ht 1.88 m (6\' 2" )   Wt 89.2 kg (196 lb 11.2 oz)   SpO2 97%   BMI 25.25 kg/m     Physical Exam:  Neuro: Intact, MAE's, follows commands  Cardiac: normal S1/S2, no murmur  Lungs: diminished    Abdomen: soft, NT/ND, active BS  Extremities: warm and well perfused, palpable pulses. Mild peripheral edema  Wounds: Sternum stable, incision C/D/I    Specific Labs:     CBC:  Recent Labs     02/08/22  0429   WBC 6.14   Hgb 8.2*   Hematocrit 22.2*   Platelets 126*     BMP:  Recent Labs     02/08/22  1154 02/08/22  0429 02/07/22  0540   Sodium 126* 122* 129*   Potassium 4.0 4.7 4.0   Chloride 92* 92* 98*   CO2 27 23 26    BUN 8.0* 9.0 8.0*   Creatinine 0.8 0.7 0.8   Glucose 85 90 99   Magnesium  --  1.7 1.7   Calcium 9.2 8.6 8.2*   Phosphorus  --   --  2.3         Meds:            Current Facility-Administered Medications   Medication Dose Route Frequency    acetaminophen  1,000 mg Oral 4 times per day    amiodarone  200 mg Oral Q12H [facility]    aspirin EC  81 mg Oral Daily    atorvastatin  40 mg Oral QHS    chlorhexidine  15 mL Mouth/Throat Q12H SCH    enoxaparin  40 mg Subcutaneous Q24H SCH    famotidine  20 mg Oral Q12H SCH    folic acid  1 mg Oral Daily    furosemide  20 mg Intravenous Once    gabapentin  300 mg Oral Q8H SCH    insulin lispro  1-3 Units Subcutaneous QHS    insulin lispro  1-5 Units Subcutaneous TID AC    lidocaine  2 patch Transdermal Q24H    magnesium sulfate  1 g Intravenous Once    metoprolol tartrate  12.5 mg Oral Q12H SCH    multivitamin  1 tablet Oral Daily    nicotine  1 patch Transdermal Daily    polyethylene glycol  17 g Oral BID    potassium chloride  20 mEq Oral Once    senna-docusate  2 tablet Oral Q12H SCH    tamsulosin  0.4 mg Oral Daily after dinner    thiamine  200 mg Oral Daily    tiotropium  1 puff Inhalation QAM           Signed by: Rikki Spearing, NP  Cardiac Surgery

## 2022-02-07 NOTE — Plan of Care (Signed)
Date/Time:  02/07/22 6:48 PM   Patient Name:  Erik [name]    Age: 46 y.o.   Room:  FI264/FI264-01     Cardiac Surgery RN Shift   Shift Events:   -Foley re-inserted at 11:00 put out 1000L    Neuro  Ambulate with          [x]  assist       []  independently  PT/OT Dispo recs    Home with home health  Pain Controlled        []  Yes           [x]  No           Cardiac  BP Controlled [x]  Yes          []  No          PRN meds given [x]  Yes            []  No      Rhythm                    Sinus rhythm      Arrhythmias         []  Yes            [x]  No      Pacing Wires           [x]  Yes           []  No                           Pulmonary                     O2 NC                      []  Yes           [x]  No                    Chest tubes              [x]  Yes           []  No                                                                            IS                                 G.I.   Tolerating Diet          [x]  Yes           []  No      BM last 24 hrs          []  Yes          [x]  No               Last BM Date: 02/05/22 (per patient report)    PPI ppx                    [x]  Yes          []   No      INTEGUMENTARY                Incisions OTA?          [x]  Yes           []  No                   Dressing Change?      []  Yes           [x]  No       []  N/A     DATE:     Renal                Foley present            [x]  Yes          []  No                                            Heme  DVT Prophylaxis            [x]  Yes           []  No                    Anticoagulation        [x]  Yes           []  No       ID   Febrile                     []  Yes          [x]  No        Endocrine   Off insulin drip         [x]  Yes           []  No      BS controlled           [x]  Yes           []  No      _____________________________________________________________________________________         Barriers to discharge:   CT inplace  Wires in place  Urinary Retention   Anticipated discharge date:  TBD    Priority discharge:  []  Yes             [x]  No         Problem: Moderate/High Fall Risk Score >5  Goal: Patient will remain free of falls  Outcome: Progressing     Problem: Post-op Phase - Cardiac Surgery  Goal: Effective breathing pattern is maintained  Outcome: Progressing  Goal: Patient will remain free from post-op complications  Outcome: Progressing  Goal: Mobility/activity is maintained at optimal level  Outcome: Progressing  Goal: Nutritional intake is adequate  Outcome: Progressing     Problem: Compromised Tissue integrity  Goal: Damaged tissue is healing and protected  Outcome: Progressing  Goal: Nutritional status is improving  Outcome: Progressing

## 2022-02-07 NOTE — OT Progress Note (Signed)
Occupational Therapy Treatment  Erik [name]        Post Acute Care Therapy Recommendations:     Discharge Recommendations:  Home with supervision, Home with home health OT    DME needs IF patient is discharging home: Front wheel walker, Shower chair    Therapy discharge recommendations may change with patient status.  Please refer to most recent note for up-to-date recommendations.    Assessment:   Significant Findings: none     Pt is  POD#: 2   on 02/05/22:  Elective, CABG x 3 (LIMA-LAD, SVG-PDA, L Radial-OM1), PFO closure. Pt educated in Move in the TUbe and handout provided. Pt completes ADLS with S to Min A and cues for safety , move in the Tube and Call don't fall.  Pt completes sit to stand with CGA  with RW and ambulates chair to toilet with CGA with RW, toileting SBA with RTS .  Pt mother at side and is supportive.   Pt presents with acute functional decline with ADLS and Mobility for self care with decreased strength, decreased activity tolerance, decreased balance and is at risk for falls. Pt will benefit from OT to maximize Independence with ADLS and mobility for self care.      Treatment Activities: ADLS, therapeutic activity, therapeutic exercise, functional mobility for self care and skilled instruction     Educated the patient to role of occupational therapy, plan of care, goals of therapy and HEP, safety with mobility and ADLs, energy conservation techniques, Move in the Tube  sternal precautions, discharge instructions, home safety.    Plan:    OT Frequency Recommended: 3-4x/wk     Continue per POC     [facility]  Bed: FI264/FI264-01       Precautions and Contraindications:   Falls  Move in the Tube  CIWA     Updated Medical Status/Imaging/Labs:  Preop Course: 46 y/o male w/ PmHx of HTN, HLD, CAD, hyponatremia, smoker, Heavy ETOH use, depression, TBI, who was recently evaluated in Dec 22' w/ c/o of worsening exertional angina/CP, LHC 01/08/22 revealed 3 vessel  disease CAD, w/ 100% prox CTO of RCA. On 02/05/22 he was brought to the OR for elective CABG.      02/05/22:  Elective, CABG x 3 (LIMA-LAD, SVG-PDA, L Radial-OM1), PFO closure             Surgeon: Austin Miles, MD             From OR: Gtts: Levo @ 1 ; Products: NONE ; Rhythm: Sinus Rhythm 60s     LVEF: 50%%, RVEF NORMAL  X-ray chest AP portable (daily)    Result Date: 02/07/2022  No pneumothorax 2. Mild interstitial edema with effusions Marty Heck, MD 02/07/2022 7:56 AM    X-ray chest AP portable (daily)    Result Date: 02/06/2022   1.Tubes and lines as above. 2.Residual left basilar atelectasis. 3.Mild vascular congestion. Campbell Stall 02/06/2022 8:33 AM    XR Chest AP Portable    Result Date: 02/05/2022  1. Increased opacity along the left superior mediastinum, partially obscuring the aortic knob. Mediastinal hemorrhage not excluded. Chest CT recommended for further evaluation. COMMENT: This urgent result was discussed with and acknowledged by Gerarda Fraction, NP at 1905 hours on 02/05/2022. Kennyth Lose, MD 02/05/2022 7:10 PM    X-ray chest AP portable (once)    Result Date: 02/05/2022  Postoperative changes with mild edema Genelle Bal 02/05/2022 1:21 PM  Subjective:" they need pockets on this chair   "   Patient's medical condition is appropriate for Occupational Therapy intervention at this time.  Patient is agreeable to participation in the therapy session. Nursing clears patient for therapy.    Pain:   Scale: 8 of 10  Location: chest and shoulder    Intervention: reported to RN , repositioined    Objective:   Patient is seated in a cardiac chair with telemetry , CT, external pacing wires and mom at sidein place.  Pt wore mask during therapy session:No      Cognition  A and O x4    Functional Mobility  Rolling:  NT  Supine to Sit: NT  Sit to Stand: CG  Transfers: CG    PMP Activity: Step 6 - Walks in Room    Balance  Static Sitting: Good  Dynamic Sitting: Good  Static Standing: CG  Dynamic Standing: CG RW chair to  toilet     Self Care and Home Management  Eating: I   Grooming: S sitting  Bathing: instructed in seated tech  UE Dressing: NT  LE Dressing: NT  Toileting: SBA   RTS and RW    Therapeutic Exercises  With activity    Participation: Good  Endurance: Good    Patient left with call bell within reach, all needs met, SCDs off , fall mat in place, bed alarm na, chair alarm on  and all questions answered. RN notified of session outcome and patient response.     Goals:  Time For Goal Achievement: 5 visits  ADL Goals  Patient will groom self: Supervision, at sinkside, 5 visits  Patient will dress upper body: Supervision, 5 visits  Patient will dress lower body: Supervision, 5 visits  Patient will toilet: Supervision, 5 visits  Mobility and Transfer Goals  Pt will perform functional transfers: Supervision, 5 visits        Executive Fucntion Goals  Pt will follow sternal precautions: with supervision, 5 visits (Move in the Tube)                  PPE worn during session: procedural mask and gloves    Tech present: No  PPE worn by tech: N/A    Thana FarrMari Otilio Groleau, OTR/L    Time of Treatment  OT Received On: 02/07/22  Start Time: 1210  Stop Time: 1235  Time Calculation (min): 25 min    Treatment # 1 of 5 visits

## 2022-02-07 NOTE — Progress Notes (Incomplete Revision)
Cardiac Surgery Progress Note  Date/Time:  02/08/22 3:00 PM   Patient Name:  Erik [name] Age: 46 y.o.   Room:  FI264/FI264-01     Operative Profile:   POD#: 3    02/05/22:  Elective, CABG x 3 (LIMA-LAD, SVG-PDA, L Radial-OM1), PFO closure             Surgeon: Singh, Ramesh, MD             From OR: Gtts: Levo @ 1 ; Products: NONE ; Rhythm: Sinus Rhythm 60s    LVEF: 50%%, RVEF NORMAL  Cardiologist: Lindsey Cilia, MD, [facility]    Synopsis   Indication for Surgery: Progressive angina, multivessel CAD    Preop Course: 46 y/o male w/ PmHx of HTN, HLD, CAD, hyponatremia, smoker, Heavy ETOH use, depression, TBI, who was recently evaluated in Dec 22' w/ c/o of worsening exertional angina/CP, LHC 01/08/22 revealed 3 vessel disease CAD, w/ 100% prox CTO of RCA. On 02/05/22 he was brought to the OR for elective CABG.    PMH:  has a past medical history of Chest pain (12/10/2021), Coronary artery disease involving native coronary artery of native heart without angina pectoris (02/04/2022), COVID (09/2021), Hearing loss in Left ear, History of echocardiogram (10/05/2021), History of nuclear stress test: exercise SPECT abn (11/22/2021), Hyperlipidemia, Hyponatremia, and TBI (traumatic brain injury) (1999).    Daily Summary:  4/26. SDA--> OR, chest tubes oozing, required 4 PLT, 4 Cryoprecipitate, extubated    4/27: Orthostatic, d/c BB, started on CIWA, zoloft  4/28: Transferred to SDU. Start Diuresis w/ lasix . Foley replaced for retention, on Flomax  4/29: No further orthostasis, give lasix 20 IV x1. Hyponatremic, Na 129->122->126. 500 cc FWR. PW grounded    Plan:   Remains in CVSDU for post operative management, hyponatremia, CT draining  -  Monitor chest tubes, likely remove tomorrow if drainage minimal  -  Monitor blood pressures; previously orthostatic- now resolved. Encourage PO intake  -  Gentle diuresis; remove CT when able  -  Monitor for ETOH withdrawal, continue CIWA  -  Likely  COPD, Spirivia and PRN duonebs  - Will need DAPT, once PW removed    Standard Events:     -    Extubated  POD #0   -    Transferred to SDU POD # 2   -    Removed CTs POD # ___   -    Removed PW POD # ___ by *ing             System Assessment of Active Issues     Neurological?:  Pain management: Pain poorly controlled, increased gabapentin  Chronic Depression, restarted on zoloft   Heavy ETOH use, last drink 4/25, asymptomatic, on CIWA protocol  Nicotine dependence: no replacement due to risk of coronary graft spasm; smoking cessation reinforced  Hx of TBI, no baseline deficits      Cardiovascular:  CAD/Multivessel, s/p CABG, stable/improving, continue ASA, Statin, start plavix once PW removed  HTN, controlled no medications, resume low dose BB   A.fib ppx: Continue Amiodarone   Orthostatic Hypotension: Resolved   HLD, Continue statin    Pulmonary:  Mild COPD/Current Smoker/20 pack year smoker, mild DOE, Started spiriva 4/27, continue w/ PRN duonebs     Gastrointestinal:  No acute issues     Renal/GU:  Volume overload, improving autodiuresing  Moderate hyponatremia, acute on chronic, asymptomatic baseline upper 120's, avoid free water, trend w/ outpatient f/u w/ PCP, consider   nephrology consult if worsens     Infectious Disease:  Clinically, no acute issue      Hematological:  Acute Blood Loss Anemia, and Coagulopathy as expected from surgery, H &H stable   Thrombocytopenia, tolerating low dose ASA, trend closely during hospital course      Endocrinological:  Non DM, Stress Hyperglycemia controlled on insulin SSI ACHS  HgbA1C: 5.1   Lab Results   Component Value Date    HGBA1C 5.1 01/28/2022       Subjective:   Offers no complaints    Physical Exam:   BP 113/70   Pulse 84   Temp 98.4 F (36.9 C) (Oral)   Resp 16   Ht 1.88 m (6' 2")   Wt 89.2 kg (196 lb 11.2 oz)   SpO2 97%   BMI 25.25 kg/m     Physical Exam:  Neuro: Intact, MAE's, follows commands  Cardiac: normal S1/S2, no murmur  Lungs: diminished    Abdomen: soft, NT/ND, active BS  Extremities: warm and well perfused, palpable pulses. Mild peripheral edema  Wounds: Sternum stable, incision C/D/I    Specific Labs:     CBC:  Recent Labs     02/08/22  0429   WBC 6.14   Hgb 8.2*   Hematocrit 22.2*   Platelets 126*     BMP:  Recent Labs     02/08/22  1154 02/08/22  0429 02/07/22  0540   Sodium 126* 122* 129*   Potassium 4.0 4.7 4.0   Chloride 92* 92* 98*   CO2 27 23 26   BUN 8.0* 9.0 8.0*   Creatinine 0.8 0.7 0.8   Glucose 85 90 99   Magnesium  --  1.7 1.7   Calcium 9.2 8.6 8.2*   Phosphorus  --   --  2.3         Meds:            Current Facility-Administered Medications   Medication Dose Route Frequency    acetaminophen  1,000 mg Oral 4 times per day    amiodarone  200 mg Oral Q12H SCH    aspirin EC  81 mg Oral Daily    atorvastatin  40 mg Oral QHS    chlorhexidine  15 mL Mouth/Throat Q12H SCH    enoxaparin  40 mg Subcutaneous Q24H SCH    famotidine  20 mg Oral Q12H SCH    folic acid  1 mg Oral Daily    furosemide  20 mg Intravenous Once    gabapentin  300 mg Oral Q8H SCH    insulin lispro  1-3 Units Subcutaneous QHS    insulin lispro  1-5 Units Subcutaneous TID AC    lidocaine  2 patch Transdermal Q24H    magnesium sulfate  1 g Intravenous Once    metoprolol tartrate  12.5 mg Oral Q12H SCH    multivitamin  1 tablet Oral Daily    nicotine  1 patch Transdermal Daily    polyethylene glycol  17 g Oral BID    potassium chloride  20 mEq Oral Once    senna-docusate  2 tablet Oral Q12H SCH    tamsulosin  0.4 mg Oral Daily after dinner    thiamine  200 mg Oral Daily    tiotropium  1 puff Inhalation QAM           Signed by: Randilyn Foisy, NP                        Cardiac Surgery

## 2022-02-07 NOTE — Progress Notes (Signed)
[facility]- [facility]ICU  Medical Critical Care Service [facility]) Progress Note        Date / Time: 02/07/2309:25 AM Room:   FI201/FI201-01  Patient:   Erik [name],Erik [name]   Admitted: 02/05/2022  Attending:   Austin MilesSingh, Ramesh, MD   Status: Full Code     Critical Care PROGRESS Note --  Hospital Day /  LOS: 2 days        OVERVIEW/HOSPITAL COURSE:   Erik [name] is a 46 y.o. 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 01/07/2022    Chronic total occlusion of coronary artery 12/18/2021    Abnormal exercise myocardial perfusion study 12/12/2021    History of heart attack 11/12/2021    Chest pain 10/04/2021   -Stress-induced hyperglycemia  -Acute blood loss anemia  Focused Plan Based on System:   Neuro: Risk for ICU delirium, acute pain control needs, Etoh abuse  -pain controlled with tylenol, gabapentin, prn narcotics (pain uncontrolled)  - OOBTC with PT/OT consult as indicated  -cont home Zoloft  -cont CIWA monitoring given heavy etoh abuse    CV: expected cardiogenic shock, vasoplegic shock in the postoperative setting, not due to surgical complication-both resolved  -MAP > 65 mmHg, vasopressors as needed to achieve, currently off  -well-perfused off inotropic support  -NSR with pacer back up; amiodarone ppx; starting beta blocker   -minimal CT output, removal per CTS  -asa/statin    Pulm: Expected acute hypoxic respiratory  failure in the postoperative setting, not due to surgical complication: resolved  - goal sao2 >92%; on 2L NC, splinting quite a bit, needs better pain control, no resp distress  -get OOB, ambulate  -mild COPD on pre-op PFTs; started Spiriva with prn albuterol  -BPH    GU: hyponatremia, volume overload  - Renally dose all medications and avoid nephrotoxins.   - Electrolyte repletion per protocol.  -cont diuresis for net neg 1L/24hours  -start Flomax for urinary retention    Endo: stress-induced hyperglycemia  - SSI for BG goal 120-180 mg/dL.    Heme: acute blood loss anemia  -no signs of active bleeding, no need for ongoing transfusion to meet hg >7 goal  -watch plts, expected consumption    ID: Risk for iatrogenic infections   - No evidence infection at the present time, no requirement for antibiotics.   - De-escalation of indwelling tubes, catheters, and vascular access lines.     Code Status: Full Code  Ppx: Pepcid, Lovenox  Diet: tolerating full diet, bowel regimen-patient refusing, counseled   Access:remove CVL and A Line    Dispo: ok for SDU    SUBJECTIVE:    Review of Systems   Constitutional: Negative.    HENT: Negative.     Eyes: Negative.    Respiratory: Negative.     Cardiovascular:  Positive for chest pain.   Gastrointestinal: Negative.    Genitourinary: Negative.    Musculoskeletal: Negative.  Skin: Negative.    Neurological: Negative.    Endo/Heme/Allergies: Negative.    Psychiatric/Behavioral: Negative.       OBJECTIVE:  Vitals:    02/07/22 1000   BP:    Pulse: 99   Resp: (!) 23   Temp:    SpO2: 95%       GEN: NAD, well-appearing  HEENT: PERRLA, sclera anicteric  PULM: CTAB, no increased work of breathing  CV: RRR, no r/m/g, no peripheral edema, DP pulses 2+ b/l  GI: soft, NT, ND, NABS  SKIN: no rashes/lesions  NEURO: alert, no focal deficits      LABS: reviewed  Labs:     Results       Procedure Component Value Units Date/Time    CBC without differential [[id number]]  (Abnormal) Collected: 02/07/22  0540    Specimen: Blood Updated: 02/07/22 0650     WBC 7.31 x10 3/uL      Hgb 8.2 g/dL      Hematocrit 40.9 %      Platelets 137 x10 3/uL      RBC 2.48 x10 6/uL      MCV 95.2 fL      MCH 33.1 pg      MCHC 34.7 g/dL      RDW 13 %      MPV 9.5 fL      Nucleated RBC 0.0 /100 WBC      Absolute NRBC 0.00 x10 3/uL     Phosphorus [[id number]] Collected: 02/07/22 0540    Specimen: Blood Updated: 02/07/22 0641     Phosphorus 2.3 mg/dL     Narrative:      Starting For 1 Occurences  QAMLAB X 3    GFR [[id number]] Collected: 02/07/22 0540     Updated: 02/07/22 0641     EGFR >60.0       Narrative:      Starting For 1 Occurences  QAMLAB X 3    Basic Metabolic Panel [[id number]]  (Abnormal) Collected: 02/07/22 0540    Specimen: Blood Updated: 02/07/22 0641     Glucose 99 mg/dL      BUN 8.0 mg/dL      Creatinine 0.8 mg/dL      Calcium 8.2 mg/dL      Sodium 528 mEq/L      Potassium 4.0 mEq/L      Chloride 98 mEq/L      CO2 26 mEq/L      Anion Gap 5.0    Narrative:      Starting For 1 Occurences  QAMLAB X 3    Magnesium [[id number]] Collected: 02/07/22 0540    Specimen: Blood Updated: 02/07/22 0641     Magnesium 1.7 mg/dL     Narrative:      Starting For 1 Occurences  QAMLAB X 3    Glucose Whole Blood - POCT [[id number]]  (Abnormal) Collected: 02/06/22 2152     Updated: 02/06/22 2154     Whole Blood Glucose POCT 115 mg/dL     Glucose Whole Blood - POCT [[id number]]  (Abnormal) Collected: 02/06/22 1646     Updated: 02/06/22 1648     Whole Blood Glucose POCT 156 mg/dL     CBC without differential [[id number]]  (Abnormal) Collected: 02/06/22 1036    Specimen: Blood Updated: 02/06/22 1100     WBC 7.02 x10 3/uL      Hgb 8.6 g/dL      Hematocrit 29.5 %      Platelets 159 x10 3/uL  RBC 2.66 x10 6/uL      MCV 91.4 fL      MCH 32.3 pg      MCHC 35.4 g/dL      RDW 13 %      MPV 9.2 fL      Nucleated RBC 0.0 /100 WBC      Absolute NRBC 0.00 x10 3/uL     Glucose Whole Blood - POCT [[id number]]  (Abnormal) Collected: 02/06/22 1039     Updated:  02/06/22 1042     Whole Blood Glucose POCT 126 mg/dL             Radiology / Imaging:     X-ray chest AP portable (daily)   Final Result      No pneumothorax   2. Mild interstitial edema with effusions      Marty Heck, MD   02/07/2022 7:56 AM      X-ray chest AP portable (daily)   Final Result          1.Tubes and lines as above.   2.Residual left basilar atelectasis.   3.Mild vascular congestion.      Campbell Stall   02/06/2022 8:33 AM      XR Chest AP Portable   Final Result         1. Increased opacity along the left superior mediastinum, partially obscuring the aortic knob. Mediastinal hemorrhage not excluded. Chest CT recommended for further evaluation.      COMMENT: This urgent result was discussed with and acknowledged by Gerarda Fraction, NP at 1905 hours on 02/05/2022.      Kennyth Lose, MD   02/05/2022 7:10 PM      X-ray chest AP portable (once)   Final Result   Postoperative changes with mild edema      Genelle Bal   02/05/2022 1:21 PM      X-ray chest AP portable (daily)    (Results Pending)       Marijean Niemann, MD  Critical Care, [facility]

## 2022-02-07 NOTE — Plan of Care (Signed)
Patient: Erik [name], Erik [name]   Date: 02/07/2022  LOS: 2  Attending: Austin Miles, MD  APP: Marvis Moeller    Assumed care of patient at: 0700    Review of Systems    Neuro/Activity: Aox4. CIWA 1. Standby assist to chair. Complained of severe pain, 9-8/10, PRN tramadol given with scheduled tylenol. At re-assessment, patient stated pain was still a 8/10. One time order for IVP Dilaudid given. Re-assessment was 3/10.     CV: NSR. A-V wires, set at backup rate. BP stable. Metoprolol held this AM for borderline BP per PA Miles. Nicotine patch re-ordered following conversation with team.     Respiratory: 2 LPM via NC - patient de-sats to 86% on RA. Patient stated pain was limiting his ability to take a deep breath - see above for pain interventions. Coughing up tan/clear secretions. CT atrium changed out.     GI: Cardiac/Carb limited. AC/HS. Last BM 4/26 - patient refused Mirlax, education given on their importance, endorsed to team.     GU: Bladder scanned patient this am for . Foley re-inserted and 1L returned. IVP Lasix given x1.     Skin: Dressings removed this AM - all surgical sites CDI.     Dispo/Plan: Transferred to CVSD room 264 with belongings and mother.     Problem: Moderate/High Fall Risk Score >5  Goal: Patient will remain free of falls  Outcome: Progressing  Flowsheets (Taken 02/06/2022 0800 by Seward Carol, RN)  Moderate Risk (6-13):   MOD-Apply bed exit alarm if patient is confused   MOD-Floor mat at bedside (where available) if appropriate   MOD-Perform dangle, stand, walk (DSW) prior to mobilization     Problem: Post-op Phase - Cardiac Surgery  Goal: Effective breathing pattern is maintained  Outcome: Progressing  Flowsheets (Taken 02/05/2022 1951 by Seward Carol, RN)  Effective breathing pattern is maintained:   Position patient for maximum ventilatory efficiency with HOB to a minimum of 30 degrees when hemodynamically stable   Maintain SpO2 level per LIP order   Reinforce use of ordered  respiratory interventions (i.e. CPAP, BiPAP, Incentive Spirometer, Acapella)   Monitor for sleep apnea   Monitor for medication-induced respiratory depression     Problem: Compromised Tissue integrity  Goal: Damaged tissue is healing and protected  Outcome: Progressing  Flowsheets (Taken 02/06/2022 1143 by Seward Carol, RN)  Damaged tissue is healing and protected:   Monitor/assess Braden scale every shift   Provide wound care per wound care algorithm   Reposition patient every 2 hours and as needed unless able to reposition self   Increase activity as tolerated/progressive mobility   Relieve pressure to bony prominences for patients at moderate and high risk   Avoid shearing injuries   Keep intact skin clean and dry   Use bath wipes, not soap and water, for daily bathing

## 2022-02-07 NOTE — PT Progress Note (Signed)
Physical Therapy Treatment Erik [name]  FI264/FI264-01   POST ACUTE CARE THERAPY RECOMMENDATIONS:      Discharge Recommendations:  Home with supervision    D/C Milestones: stair training; Anticipate achievement in 1-2 sessions    DME needs IF patient is discharging home: Front wheel walker    Therapy discharge recommendations may change with patient status.  Please refer to most recent note for up-to-date recommendations.     ASSESSMENT:     Significant Findings: none    Patient seated in a bedside chair upon arrival, amenable to physical therapy treatment session. Patient performed most functional mobility with CGA, ambulating 67ft in hallway with FWW. Reviewed BLE HEP exercises seated in recliner. Patient will continue to benefit from inpatient PT services to maximize functional mobility and independence.    Treatment Activities: gait training, therapeutic exercise    Educated the patient to role of physical therapy, plan of care, goals of therapy and safety with mobility and ADLs, HEP, move-in-tube precautions.     PLAN:   Treatment/Interventions: Exercise, Gait training, Stair training, Neuromuscular re-education, Functional transfer training, LE strengthening/ROM, Endurance training, Patient/family training, Bed mobility, Equipment eval/education    PT Frequency: 3-4x/wk    Continue plan of care.       Precautions and Contraindications:   Falls  Move-in-tube precautions     SUBJECTIVE: "I feel better today"   Patient's medical condition is appropriate for Physical Therapy intervention at this time.  Patient is agreeable to participation in the therapy session. Nursing clears patient for therapy.    Pain Assessment:  No report of pain     OBJECTIVE:   Observation of Patient:   Patient with telemetry, chest tube, external pacer, foley catheter in place.  PPE worn during session: gloves    Functional Mobility:  PMP - Progressive Mobility Protocol   PMP Activity: Step 7 - Walks out of Room  Distance Walked  (ft) (Step 6,7): 600 Feet    Transfers:  Sit to Stand: Contact-guard assistance  Stand to Sit: Contact-guard assistance    Ambulation:  Distance: 600 Feet  Device: Front wheel walker  Assist Level: Contact-guard assistance  Pattern: decreased pace, decreased cadence    Balance:  Static sitting: good   Dynamic sitting: good   Static standing: good   Dynamic standing: fair     Patient Participation: good  Patient Endurance: fair    Patient left with call bell within reach, all needs met, and all questions answered    SCD's off (as found)  Floor mat in place  Chair alarm in place    Goals:  Goals  Goal Formulation: With patient/family  Time for Goal Acheivement: 5 visits  Goals: Select goal  Pt Will Go Supine To Sit: with supervision  Pt Will Perform Sit to Stand: with supervision  Pt Will Ambulate: > 200 feet, with supervision, with rolling walker  Pt Will Go Up / Down Stairs: 6-10 stairs, with supervision, With rail    Time of Treatment  PT Received On: 02/07/22  Start Time: 1500  Stop Time: 1540  Time Calculation (min): 40 min  Treatment # 1 out of 5 visits    Daleen Squibb, PT, DPT

## 2022-02-08 ENCOUNTER — Inpatient Hospital Stay: Payer: Commercial Managed Care - POS

## 2022-02-08 LAB — CBC AND DIFFERENTIAL
Absolute NRBC: 0 10*3/uL (ref 0.00–0.00)
Basophils Absolute Automated: 0.02 10*3/uL (ref 0.00–0.08)
Basophils Automated: 0.3 %
Eosinophils Absolute Automated: 0.04 10*3/uL (ref 0.00–0.44)
Eosinophils Automated: 0.7 %
Hematocrit: 22.2 % — ABNORMAL LOW (ref 37.6–49.6)
Hgb: 8.2 g/dL — ABNORMAL LOW (ref 12.5–17.1)
Immature Granulocytes Absolute: 0.02 10*3/uL (ref 0.00–0.07)
Immature Granulocytes: 0.3 %
Instrument Absolute Neutrophil Count: 3.57 10*3/uL (ref 1.10–6.33)
Lymphocytes Absolute Automated: 1.93 10*3/uL (ref 0.42–3.22)
Lymphocytes Automated: 31.4 %
MCH: 34.2 pg — ABNORMAL HIGH (ref 25.1–33.5)
MCHC: 36.9 g/dL — ABNORMAL HIGH (ref 31.5–35.8)
MCV: 92.5 fL (ref 78.0–96.0)
MPV: 9.6 fL (ref 8.9–12.5)
Monocytes Absolute Automated: 0.56 10*3/uL (ref 0.21–0.85)
Monocytes: 9.1 %
Neutrophils Absolute: 3.57 10*3/uL (ref 1.10–6.33)
Neutrophils: 58.2 %
Nucleated RBC: 0 /100 WBC (ref 0.0–0.0)
Platelets: 126 10*3/uL — ABNORMAL LOW (ref 142–346)
RBC: 2.4 10*6/uL — ABNORMAL LOW (ref 4.20–5.90)
RDW: 13 % (ref 11–15)
WBC: 6.14 10*3/uL (ref 3.10–9.50)

## 2022-02-08 LAB — BASIC METABOLIC PANEL
Anion Gap: 7 (ref 5.0–15.0)
Anion Gap: 7 (ref 5.0–15.0)
Anion Gap: 10 (ref 5.0–15.0)
BUN: 9 mg/dL (ref 9.0–28.0)
BUN: 8 mg/dL — ABNORMAL LOW (ref 9.0–28.0)
BUN: 10 mg/dL (ref 9.0–28.0)
CO2: 23 mEq/L (ref 17–29)
CO2: 27 mEq/L (ref 17–29)
CO2: 27 mEq/L (ref 17–29)
Calcium: 8.6 mg/dL (ref 8.5–10.5)
Calcium: 9.2 mg/dL (ref 8.5–10.5)
Calcium: 9.1 mg/dL (ref 8.5–10.5)
Chloride: 92 mEq/L — ABNORMAL LOW (ref 99–111)
Chloride: 92 mEq/L — ABNORMAL LOW (ref 99–111)
Chloride: 91 mEq/L — ABNORMAL LOW (ref 99–111)
Creatinine: 0.7 mg/dL (ref 0.5–1.5)
Creatinine: 0.8 mg/dL (ref 0.5–1.5)
Creatinine: 0.8 mg/dL (ref 0.5–1.5)
Glucose: 90 mg/dL (ref 70–100)
Glucose: 85 mg/dL (ref 70–100)
Glucose: 93 mg/dL (ref 70–100)
Potassium: 4.7 mEq/L (ref 3.5–5.3)
Potassium: 4 mEq/L (ref 3.5–5.3)
Potassium: 4 mEq/L (ref 3.5–5.3)
Sodium: 122 mEq/L — ABNORMAL LOW (ref 135–145)
Sodium: 126 mEq/L — ABNORMAL LOW (ref 135–145)
Sodium: 128 mEq/L — ABNORMAL LOW (ref 135–145)

## 2022-02-08 LAB — GLUCOSE WHOLE BLOOD - POCT
Whole Blood Glucose POCT: 110 mg/dL — ABNORMAL HIGH (ref 70–100)
Whole Blood Glucose POCT: 90 mg/dL (ref 70–100)
Whole Blood Glucose POCT: 94 mg/dL (ref 70–100)
Whole Blood Glucose POCT: 79 mg/dL (ref 70–100)
Whole Blood Glucose POCT: 112 mg/dL — ABNORMAL HIGH (ref 70–100)

## 2022-02-08 LAB — GFR
EGFR: >60
EGFR: >60
EGFR: >60

## 2022-02-08 LAB — MAGNESIUM: Magnesium: 1.7 mg/dL (ref 1.6–2.6)

## 2022-02-08 MED ORDER — FUROSEMIDE 10 MG/ML IJ SOLN
20.0000 mg | Freq: Once | INTRAMUSCULAR | Status: AC
Start: 2022-02-08 — End: 2022-02-08
  Administered 2022-02-08: 20 mg via INTRAVENOUS
  Filled 2022-02-08: qty 4

## 2022-02-08 MED ORDER — MAGNESIUM SULFATE IN D5W 1-5 GM/100ML-% IV SOLN
1.0000 g | INTRAVENOUS | Status: AC
Start: 2022-02-08 — End: 2022-02-08
  Administered 2022-02-08 (×2): 1 g via INTRAVENOUS
  Filled 2022-02-08 (×2): qty 100

## 2022-02-08 MED ORDER — POTASSIUM CHLORIDE CRYS ER 20 MEQ PO TBCR
20.0000 meq | EXTENDED_RELEASE_TABLET | Freq: Once | ORAL | Status: AC
Start: 2022-02-08 — End: 2022-02-08
  Administered 2022-02-08: 20 meq via ORAL
  Filled 2022-02-08: qty 1

## 2022-02-08 MED ORDER — MAGNESIUM SULFATE IN D5W 1-5 GM/100ML-% IV SOLN
1.0000 g | Freq: Once | INTRAVENOUS | Status: AC
Start: 2022-02-08 — End: 2022-02-08
  Administered 2022-02-08: 1 g via INTRAVENOUS
  Filled 2022-02-08: qty 100

## 2022-02-08 MED ORDER — LIDOCAINE 5 % EX PTCH
2.0000 | MEDICATED_PATCH | CUTANEOUS | Status: DC
Start: 2022-02-08 — End: 2022-02-10
  Administered 2022-02-08 – 2022-02-09 (×2): 2 via TRANSDERMAL
  Filled 2022-02-08 (×2): qty 2

## 2022-02-08 MED ORDER — PANTOPRAZOLE SODIUM 40 MG PO TBEC
40.0000 mg | DELAYED_RELEASE_TABLET | Freq: Every morning | ORAL | Status: DC
Start: 2022-02-08 — End: 2022-02-10
  Administered 2022-02-08 – 2022-02-10 (×3): 40 mg via ORAL
  Filled 2022-02-08 (×3): qty 1

## 2022-02-08 MED ORDER — POLYETHYLENE GLYCOL 3350 17 G PO PACK
17.0000 g | PACK | Freq: Two times a day (BID) | ORAL | Status: DC
Start: 2022-02-08 — End: 2022-02-09
  Administered 2022-02-08 – 2022-02-09 (×2): 17 g via ORAL
  Filled 2022-02-08 (×2): qty 1

## 2022-02-08 NOTE — Plan of Care (Signed)
Problem: Moderate/High Fall Risk Score >5  Goal: Patient will remain free of falls  Outcome: Progressing     Problem: Post-op Phase - Cardiac Surgery  Goal: Effective breathing pattern is maintained  Outcome: Progressing  Goal: Patient will remain free from post-op complications  Outcome: Progressing  Goal: Mobility/activity is maintained at optimal level  Outcome: Progressing  Goal: Nutritional intake is adequate  Outcome: Progressing     Problem: Compromised Tissue integrity  Goal: Damaged tissue is healing and protected  Outcome: Progressing  Goal: Nutritional status is improving  Outcome: Progressing

## 2022-02-08 NOTE — PT Progress Note (Signed)
Physical Therapy Evaluation Erik [name]  FI264/FI264-01   POST ACUTE CARE THERAPY RECOMMENDATIONS:      Discharge Recommendations:  Home with supervision    DME needs IF patient is discharging home: No additional equipment/DME recommended at this time    Therapy discharge recommendations may change with patient status.  Please refer to most recent note for up-to-date recommendations.     ASSESSMENT:     Significant Findings: none    Patient seated in a bedside chair upon arrival, amenable to physical therapy treatment session. Patient performed all functional mobility with supervision, ambulating 319ft in hallway without assistive device. No further acute PT needs at this time. D/C PT. Recommend RN staff enable OOB for all meals and walking in hallway 2-3x/day to maintain patient's independence during hospitalization.    Patient left without needs and call bell within reach. RN notified of session outcome.     Treatment Activities: gait training, therapeutic exercise    Educated the patient to role of physical therapy, plan of care, goals of therapy and safety with mobility and ADLs, HEP, move-in-tube precautions.     PLAN:   Treatment/Interventions: No skilled interventions needed at this time    Discharge from PT Acute Care Services.       Precautions and Contraindications:   Falls  Move-in-tube precautions     SUBJECTIVE: "Doing two laps yesterday wore me out"   Patient's medical condition is appropriate for Physical Therapy intervention at this time.  Patient is agreeable to participation in the therapy session. Nursing clears patient for therapy.    Pain Assessment:  Numeric Scale (0-10): 6  Location: sternum  Intervention: positioned for comfort     OBJECTIVE:   Observation of Patient:   Patient with telemetry, chest tube, external pacer in place.  PPE worn during session: gloves    Functional Mobility:  PMP - Progressive Mobility Protocol   PMP Activity: Step 7 - Walks out of Room  Distance Walked (ft)  (Step 6,7): 300 Feet    Transfers:  Sit to Stand: Supervision  Stand to Sit: Supervision    Ambulation:  Distance: 300 Feet  Device: None  Assist Level: Supervision  Pattern: decreased pace, decreased cadence    Balance:  Static sitting: good   Dynamic sitting: good   Static standing: good   Dynamic standing: good     Patient Participation: good  Patient Endurance: good    Patient left with call bell within reach, all needs met, and all questions answered    SCD's off (as found)  Floor mat in place  Chair alarm in place    Goals:  Goals  Goal Formulation: With patient/family  Time for Goal Acheivement: 5 visits  Goals: Select goal  Pt Will Go Supine To Sit: with supervision  Pt Will Perform Sit to Stand: with supervision  Pt Will Ambulate: > 200 feet, with supervision, with rolling walker  Pt Will Go Up / Down Stairs: 6-10 stairs, with supervision, With rail    Time of Treatment  PT Received On: 02/08/22  Start Time: 1000  Stop Time: 1030  Time Calculation (min): 30 min    Daleen Squibb, PT, DPT

## 2022-02-08 NOTE — OT Progress Note (Signed)
Occupational Therapy Treatment  Erik [name]        Post Acute Care Therapy Recommendations:     Discharge Recommendations:  Home with supervision    DME needs IF patient is discharging home: Shower chair    Therapy discharge recommendations may change with patient status.  Please refer to most recent note for up-to-date recommendations.    Assessment:   Significant Findings: none     Upon OT arrival patient sitting up in bedside chair agreeable to OT treatment.Patient has made good progress towards OT goals. Therapist provided continued education on move in the tube precautions during ADLs & functional balance/mobility tasks. Patient is able to complete simulated bathing & UB/LB dressing tasks with supervision. Patient is able to complete STS with supervision. Patient is able to ambulate > than household distances with no device and supervision from therapist. Therapist provided education on energy conservation techniques & pacing. Patient receptive to all education. Patient has met all short term OT goals. No further acute OT needs at this time. D/C from acute OT services.      Treatment Activities: ADLS, therapeutic activity, therapeutic exercise, functional mobility for self care and skilled instruction     Educated the patient to role of occupational therapy, plan of care, goals of therapy and HEP, safety with mobility and ADLs, energy conservation techniques, Move in the Tube  sternal precautions, discharge instructions, home safety.    Plan:    OT Frequency Recommended: therapy discontinued     D/C acute OT services    [facility]  Bed: FI264/FI264-01       Precautions and Contraindications:   Falls  Move in the Tube  CIWA     Updated Medical Status/Imaging/Labs:  Reviewed      Subjective: "I feel good about going home"    Patient's medical condition is appropriate for Occupational Therapy intervention at this time.  Patient is agreeable to participation in the therapy  session. Nursing clears patient for therapy.    Pain:   Scale: 5 of 10  Location: chest and shoulder  Intervention: reported to RN , repositioined    Objective:   Patient is seated in a cardiac chair with telemetry , chest, tube, foley and external pacing wires in place.  Pt wore mask during therapy session:No      Cognition  A and O x4    Functional Mobility  Rolling:  NT  Supine to Sit: NT  Sit to Stand: supervision  Transfers: supervision     PMP Activity: Step 5 - Chair;Step 6 - Walks in Room;Step 7 - Walks out of Room    Balance  Static Sitting: Good  Dynamic Sitting: Good  Static Standing: good  Dynamic Standing: good    Self Care and Home Management  Eating: Independent  Grooming: Supervision standing   Bathing: educated on ECT & pacing & shower chair DME  UE Dressing: supervision  LE Dressing: supervision   Toileting: supervision     Therapeutic Exercises  With activity    Participation: Good  Endurance: Good    Patient left with call bell within reach, all needs met, SCDs off , fall mat in place, bed alarm na, chair alarm on  and all questions answered. RN notified of session outcome and patient response.     Goals:  Time For Goal Achievement: 5 visits  ADL Goals  Patient will groom self: Supervision, Goal met  Patient will dress upper body: Supervision, Goal met  Patient will dress lower body: Supervision, Goal met  Patient will toilet: Supervision, Goal met  Mobility and Transfer Goals  Pt will perform functional transfers: Supervision, Goal met        Executive Fucntion Goals  Pt will follow sternal precautions: with supervision, Goal met                  PPE worn during session: gloves  Tech present: No  PPE worn by tech: N/A    Erik [name], OTR/L   Pager [phone]      Time of Treatment  OT Received On: 02/08/22  Start Time: 0920  Stop Time: 0950  Time Calculation (min): 30 min    Treatment #2 of 5 visits

## 2022-02-08 NOTE — Plan of Care (Signed)
Date/Time:  02/08/22 6:33 PM   Patient Name:  Erik [name]    Age: 46 y.o.   Room:  FI264/FI264-01     Cardiac Surgery RN Shift   Shift Events:   - Lasix started, Pt urinated around 2500L    Neuro  Ambulate with          [x]  assist       []  independently  PT/OT Dispo recs    Home with home health  Pain Controlled        [x]  Yes           []  No           Cardiac  BP Controlled [x]  Yes          []  No          PRN meds given [x]  Yes            [x]  No      Rhythm                    Sinus rhythm      Arrhythmias         []  Yes            [x]  No      Pacing Wires           [x]  Yes           []  No                           Pulmonary                     O2 NC                      []  Yes           [x]  No                    Chest tubes              [x]  Yes           []  No                                                                            IS                                 G.I.   Tolerating Diet          [x]  Yes           []  No      BM last 24 hrs          [x]  Yes          []  No               Date: 02/09/22-Small   PPI ppx                    [x]  Yes          []  No  INTEGUMENTARY                Incisions OTA?          [x]  Yes           []  No                   Dressing Change?      []  Yes           [x]  No       []  N/A     DATE:     Renal                Foley present            [x]  Yes          []  No                                            Heme  DVT Prophylaxis            [x]  Yes           []  No                    Anticoagulation        [x]  Yes           []  No       ID   Febrile                     []  Yes          [x]  No        Endocrine   Off insulin drip         [x]  Yes           []  No      BS controlled           [x]  Yes           []  No      _____________________________________________________________________________________         Barriers to discharge:   CT in place  Diuresing  Wires  Anticipated discharge date:   TBD    Priority discharge:  []  Yes            [x]  No        Problem: Moderate/High  Fall Risk Score >5  Goal: Patient will remain free of falls  Outcome: Progressing     Problem: Post-op Phase - Cardiac Surgery  Goal: Effective breathing pattern is maintained  Outcome: Progressing  Goal: Patient will remain free from post-op complications  Outcome: Progressing  Goal: Mobility/activity is maintained at optimal level  Outcome: Progressing  Goal: Nutritional intake is adequate  Outcome: Progressing     Problem: Compromised Tissue integrity  Goal: Damaged tissue is healing and protected  Outcome: Progressing  Goal: Nutritional status is improving  Outcome: Progressing

## 2022-02-08 NOTE — Progress Notes (Signed)
Date/Time:  02/08/22 12:49 AM   Patient Name:  Erik [name]    Age: 46 y.o.   Room:  FI264/FI264-01     Cardiac Surgery RN Shift   Shift Events:   -     Neuro  Ambulate with          [x]  assist       []  independently  PT/OT Dispo recs    Home with home health  Pain Controlled        [x]  Yes           []  No           Cardiac  BP Controlled [x]  Yes          []  No          PRN meds given [x]  Yes            []  No      Rhythm                    Sinus rhythm      Arrhythmias         []  Yes            [x]  No      Pacing Wires           [x]  Yes           []  No                           Pulmonary                     O2 NC                      []  Yes           [x]  No                    Chest tubes              [x]  Yes           []  No                                                                            IS                                 G.I.   Tolerating Diet          [x]  Yes           []  No      BM last 24 hrs          []  Yes          [x]  No               Last BM Date: 02/05/22    PPI ppx                    [x]  Yes          []  No  INTEGUMENTARY                Incisions OTA?          [x]  Yes           []  No                   Dressing Change?      []  Yes           []  No       [x]  N/A     DATE:     Renal                Foley present            [x]  Yes          []  No                                            Heme  DVT Prophylaxis            [x]  Yes           []  No                    Anticoagulation        [x]  Yes           []  No       ID   Febrile                     []  Yes          [x]  No        Endocrine   Off insulin drip         [x]  Yes           []  No      BS controlled           [x]  Yes           []  No      _____________________________________________________________________________________         Barriers to discharge:   Has chest tubes in place.  Foley catheter for urinary retention.  Wires in place.  Anticipated discharge date:   /    Priority discharge:  []  Yes            [x]  No

## 2022-02-09 ENCOUNTER — Inpatient Hospital Stay: Payer: Commercial Managed Care - POS

## 2022-02-09 LAB — BASIC METABOLIC PANEL
Anion Gap: 7 (ref 5.0–15.0)
BUN: 9 mg/dL (ref 9.0–28.0)
CO2: 27 mEq/L (ref 17–29)
Calcium: 8.5 mg/dL (ref 8.5–10.5)
Chloride: 95 mEq/L — ABNORMAL LOW (ref 99–111)
Creatinine: 0.8 mg/dL (ref 0.5–1.5)
Glucose: 94 mg/dL (ref 70–100)
Potassium: 4 mEq/L (ref 3.5–5.3)
Sodium: 129 mEq/L — ABNORMAL LOW (ref 135–145)

## 2022-02-09 LAB — CBC AND DIFFERENTIAL
Absolute NRBC: 0 10*3/uL (ref 0.00–0.00)
Basophils Absolute Automated: 0.02 10*3/uL (ref 0.00–0.08)
Basophils Automated: 0.4 %
Eosinophils Absolute Automated: 0.04 10*3/uL (ref 0.00–0.44)
Eosinophils Automated: 0.8 %
Hematocrit: 21.4 % — ABNORMAL LOW (ref 37.6–49.6)
Hgb: 7.6 g/dL — ABNORMAL LOW (ref 12.5–17.1)
Immature Granulocytes Absolute: 0.02 10*3/uL (ref 0.00–0.07)
Immature Granulocytes: 0.4 %
Instrument Absolute Neutrophil Count: 2.51 10*3/uL (ref 1.10–6.33)
Lymphocytes Absolute Automated: 1.67 10*3/uL (ref 0.42–3.22)
Lymphocytes Automated: 35.2 %
MCH: 33 pg (ref 25.1–33.5)
MCHC: 35.5 g/dL (ref 31.5–35.8)
MCV: 93 fL (ref 78.0–96.0)
MPV: 9.4 fL (ref 8.9–12.5)
Monocytes Absolute Automated: 0.48 10*3/uL (ref 0.21–0.85)
Monocytes: 10.1 %
Neutrophils Absolute: 2.51 10*3/uL (ref 1.10–6.33)
Neutrophils: 53.1 %
Nucleated RBC: 0 /100 WBC (ref 0.0–0.0)
Platelets: 162 10*3/uL (ref 142–346)
RBC: 2.3 10*6/uL — ABNORMAL LOW (ref 4.20–5.90)
RDW: 13 % (ref 11–15)
WBC: 4.74 10*3/uL (ref 3.10–9.50)

## 2022-02-09 LAB — MAGNESIUM: Magnesium: 2 mg/dL (ref 1.6–2.6)

## 2022-02-09 LAB — GLUCOSE WHOLE BLOOD - POCT: Whole Blood Glucose POCT: 85 mg/dL (ref 70–100)

## 2022-02-09 LAB — GFR: EGFR: >60

## 2022-02-09 MED ORDER — CLOPIDOGREL BISULFATE 75 MG PO TABS
75.0000 mg | ORAL_TABLET | Freq: Every day | ORAL | Status: DC
Start: 2022-02-10 — End: 2022-02-10
  Administered 2022-02-10: 75 mg via ORAL
  Filled 2022-02-09: qty 1

## 2022-02-09 MED ORDER — POTASSIUM CHLORIDE CRYS ER 20 MEQ PO TBCR
40.0000 meq | EXTENDED_RELEASE_TABLET | Freq: Once | ORAL | Status: AC
Start: 2022-02-09 — End: 2022-02-09
  Administered 2022-02-09: 40 meq via ORAL
  Filled 2022-02-09: qty 2

## 2022-02-09 MED ORDER — FUROSEMIDE 10 MG/ML IJ SOLN
20.0000 mg | Freq: Every day | INTRAMUSCULAR | Status: DC
Start: 2022-02-09 — End: 2022-02-10
  Administered 2022-02-09: 20 mg via INTRAVENOUS
  Filled 2022-02-09: qty 4

## 2022-02-09 MED ORDER — POLYETHYLENE GLYCOL 3350 17 G PO PACK
17.0000 g | PACK | Freq: Every day | ORAL | Status: DC | PRN
Start: 2022-02-09 — End: 2022-02-10

## 2022-02-09 MED ORDER — GABAPENTIN 100 MG PO CAPS
200.0000 mg | ORAL_CAPSULE | Freq: Three times a day (TID) | ORAL | Status: DC
Start: 2022-02-09 — End: 2022-02-10
  Administered 2022-02-09 – 2022-02-10 (×2): 200 mg via ORAL
  Filled 2022-02-09 (×2): qty 2

## 2022-02-09 NOTE — Plan of Care (Signed)
Problem: Moderate/High Fall Risk Score >5  Goal: Patient will remain free of falls  Outcome: Progressing     Problem: Post-op Phase - Cardiac Surgery  Goal: Effective breathing pattern is maintained  Outcome: Progressing  Goal: Patient will remain free from post-op complications  Outcome: Progressing  Goal: Mobility/activity is maintained at optimal level  Outcome: Progressing  Goal: Nutritional intake is adequate  Outcome: Progressing     Problem: Compromised Tissue integrity  Goal: Damaged tissue is healing and protected  Outcome: Progressing  Goal: Nutritional status is improving  Outcome: Progressing

## 2022-02-09 NOTE — Progress Notes (Signed)
Cardiac Surgery Progress Note  Date/Time:  02/09/22 3:40 PM   Patient Name:  Erik [name] Age: 46 y.o.   Room:  FI264/FI264-01     Operative Profile:   POD#: 4    02/05/22:  Elective, CABG x 3 (LIMA-LAD, SVG-PDA, L Radial-OM1), PFO closure             Surgeon: Austin Miles, MD             From OR: Gtts: Levo @ 1 ; Products: NONE ; Rhythm: Sinus Rhythm 60s    LVEF: 50%%, RVEF NORMAL  Cardiologist: Trellis Moment, MD, [facility]    Synopsis   Indication for Surgery: Progressive angina, multivessel CAD    Preop Course: 46 y/o male w/ PmHx of HTN, HLD, CAD, hyponatremia, smoker, Heavy ETOH use, depression, TBI, who was recently evaluated in Dec 22' w/ c/o of worsening exertional angina/CP, LHC 01/08/22 revealed 3 vessel disease CAD, w/ 100% prox CTO of RCA. On 02/05/22 he was brought to the OR for elective CABG.    PMH:  has a past medical history of Chest pain (12/10/2021), Coronary artery disease involving native coronary artery of native heart without angina pectoris (02/04/2022), COVID (09/2021), Hearing loss in Left ear, History of echocardiogram (10/05/2021), History of nuclear stress test: exercise SPECT abn (11/22/2021), Hyperlipidemia, Hyponatremia, and TBI (traumatic brain injury) (1999).    Daily Summary:  4/26. SDA--> OR, chest tubes oozing, required 4 PLT, 4 Cryoprecipitate, extubated    4/27: Orthostatic, d/c BB, started on CIWA, zoloft  4/28: Transferred to SDU. Start Diuresis w/ lasix . Foley replaced for retention, on Flomax  4/29: No further orthostasis, give lasix 20 IV x1. Hyponatremic, Na 129->122->126. 500 cc FWR. PW grounded  4/30: CT and PW removed w/o incident, [place] CIWA protocol    Plan:   Remains in [facility] for post operative management, hyponatremia  -  Monitor Na levels; 126->129 today. Continue 1000 mL FWR  -  Gentle diuresis; transition to PO tomorrow  -  No signs of ETOH withdrawal >24h, discontinue CIWA protocol  -  Likely COPD, Spirivia and PRN duonebs  -   Follow up CXR 2vw s/p CT removal  -  PW out, Start Plavix 5/1    Anticipating discharge on 5/1 pending CXR2vw post CT removal, stable Na levels  PT/OT Discharge Recs: Home w/ supervision      Standard Events:     -    Extubated  POD #0   -    Transferred to SDU POD # 2   -    Removed CTs POD # 4   -    Removed PW (2A, 2V) POD # 4 by pulling             System Assessment of Active Issues     Neurological?:  Pain management: Pain poorly controlled, increased gabapentin  Chronic Depression, restarted on zoloft   Heavy ETOH use, last drink 4/25, asymptomatic, [place] CIWA protocol 4/30  Nicotine dependence: no replacement due to risk of coronary graft spasm; smoking cessation reinforced  Hx of TBI, no baseline deficits      Cardiovascular:  CAD/Multivessel, s/p CABG, stable/improving, continue ASA, Statin, start plavix once PW removed  HTN, controlled no medications, resume low dose BB   A.fib ppx: Continue Amiodarone   Orthostatic Hypotension: Resolved   HLD, Continue statin    Pulmonary:  Mild COPD/Current Smoker/20 pack year smoker, mild DOE, Started spiriva 4/27, continue w/ PRN duonebs  Gastrointestinal:  No acute issues     Renal/GU:  Volume overload, improving autodiuresing  Moderate hyponatremia, acute on chronic, asymptomatic baseline upper 120's- improving     Infectious Disease:  Clinically, no acute issue      Hematological:  Acute Blood Loss Anemia, and Coagulopathy as expected from surgery, H &H stable   Thrombocytopenia, tolerating low dose ASA, trend closely during hospital course      Endocrinological:  Non DM, Stress Hyperglycemia controlled on insulin SSI ACHS  HgbA1C: 5.1   Lab Results   Component Value Date    HGBA1C 5.1 01/28/2022       Subjective:   No acute events overnight    Physical Exam:   BP 104/58   Pulse 82   Temp 98.3 F (36.8 C)   Resp 18   Ht 1.88 m (6\' 2" )   Wt 89.2 kg (196 lb 11.2 oz)   SpO2 100%   BMI 25.25 kg/m     Physical Exam:  Neuro: Intact, MAE's, follows  commands  Cardiac: normal S1/S2, no murmur  Lungs: diminished   Abdomen: soft, NT/ND, active BS  Extremities: warm and well perfused, palpable pulses. Trace lower extremity edema  Wounds: Sternum stable, incision C/D/I    Specific Labs:     CBC:  Recent Labs     02/09/22  0436   WBC 4.74   Hgb 7.6*   Hematocrit 21.4*   Platelets 162     BMP:  Recent Labs     02/09/22  0436 02/08/22  0429 02/07/22  0540   Sodium 129*   < > 129*   Potassium 4.0   < > 4.0   Chloride 95*   < > 98*   CO2 27   < > 26   BUN 9.0   < > 8.0*   Creatinine 0.8   < > 0.8   Glucose 94   < > 99   Magnesium 2.0   < > 1.7   Calcium 8.5   < > 8.2*   Phosphorus  --   --  2.3    < > = values in this interval not displayed.         Meds:            Current Facility-Administered Medications   Medication Dose Route Frequency    acetaminophen  1,000 mg Oral 4 times per day    [facility]  200 [facility]12H [facility]    aspirin EC  81 mg Oral Daily    atorvastatin  40 mg Oral QHS    chlorhexidine  15 mL Mouth/Throat Q12H SCH    enoxaparin  40 mg Subcutaneous Q24H SCH    folic acid  1 mg Oral Daily    furosemide  20 mg Intravenous Daily    gabapentin  300 mg Oral Q8H SCH    lidocaine  2 patch Transdermal Q24H    metoprolol tartrate  12.5 mg Oral Q12H SCH    multivitamin  1 tablet Oral Daily    nicotine  1 patch Transdermal Daily    pantoprazole  40 mg Oral QAM AC    polyethylene glycol  17 g Oral BID    senna-docusate  2 tablet Oral Q12H SCH    tamsulosin  0.4 mg Oral Daily after dinner    thiamine  200 mg Oral Daily    tiotropium  1 puff Inhalation QAM           Signed by:  Rikki Spearing, NP                      Cardiac Surgery

## 2022-02-09 NOTE — Plan of Care (Signed)
Date/Time:  02/09/22 1:08 AM   Patient Name:  Erik [name]    Age: 47 y.o.   Room:  FI264/FI264-01     Cardiac Surgery RN Shift   Shift Events:   - Patient had one large BM and one small BM tonight. Foley removed at 0030 without complications. DTV at 0630. Patient notes L-sided incisional pain remains an issue. Improved with PRN tramadol. On 2L O2 overnight for desat when asleep. Vital signs stable.     Neuro  Ambulate with          [x]  assist       []  independently  PT/OT Dispo recs    Home  Pain Controlled        []  Yes           [x]  No           Cardiac  BP Controlled [x]  Yes          []  No          PRN meds given []  Yes            [x]  No      Rhythm                    Sinus rhythm      Arrhythmias         []  Yes            [x]  No      Pacing Wires           [x]  Yes           []  No                           Pulmonary                     O2 NC                      [x]  Yes           []  No                    Chest tubes              [x]  Yes           []  No                                                                            IS                              750 mL    G.I.   Tolerating Diet          [x]  Yes           []  No      BM last 24 hrs          [x]  Yes          []  No               Last BM Date: 02/09/22 (  small)    PPI ppx                    [x]  Yes          []  No      INTEGUMENTARY                Incisions OTA?          [x]  Yes           []  No                   Dressing Change?      []  Yes           [x]  No       []  N/A     DATE: 02/09/2022    Renal                Foley present            []  Yes          [x]  No                                            Heme  DVT Prophylaxis            [x]  Yes           []  No                    Anticoagulation        [x]  Yes           []  No       ID   Febrile                     []  Yes          [x]  No        Endocrine   Off insulin drip         [x]  Yes           []  No      BS controlled           [x]  Yes           []  No       _____________________________________________________________________________________         Barriers to discharge:   Void trial  Chest tube and pacer wire removal    Anticipated discharge date:   02/10/2022    Priority discharge:  []  Yes            [x]  No           Problem: Moderate/High Fall Risk Score >5  Goal: Patient will remain free of falls  Outcome: Progressing     Problem: Post-op Phase - Cardiac Surgery  Goal: Effective breathing pattern is maintained  Outcome: Progressing  Goal: Patient will remain free from post-op complications  Outcome: Progressing  Goal: Mobility/activity is maintained at optimal level  Outcome: Progressing  Goal: Nutritional intake is adequate  Outcome: Progressing     Problem: Compromised Tissue integrity  Goal: Damaged tissue is healing and protected  Outcome: Progressing  Goal: Nutritional status is improving  Outcome: Progressing

## 2022-02-09 NOTE — Progress Notes (Signed)
Date/Time:  02/09/22 3:34 PM   Patient Name:  Erik [name]    Age: 46 y.o.   Room:  FI264/FI264-01     Cardiac Surgery RN Shift   Shift Events:   - VSS, BM multiple, CT &WIRE out CIWA protocol D/C @ 1230pm, PRN Tramadol given for shoulder pain    Neuro  Ambulate with          []  assist       [x]  independently  PT/OT Dispo recs    Home with home health  Pain Controlled        []  Yes           [x]  No           Cardiac  BP Controlled [x]  Yes          []  No          PRN meds given [x]  Yes            []  No      Rhythm                    Sinus rhythm      Arrhythmias         []  Yes            [x]  No      Pacing Wires           []  Yes           [x]  No                           Pulmonary                     O2 NC                      []  Yes           [x]  No                    Chest tubes              []  Yes           [x]  No                                                                            IS                                 G.I.   Tolerating Diet          [x]  Yes           []  No      BM last 24 hrs          [x]  Yes          []  No               Last BM Date: 02/09/22    PPI ppx                    [  x] Yes          []  No      INTEGUMENTARY                Incisions OTA?          [x]  Yes           []  No                   Dressing Change?      []  Yes           []  No       [x]  N/A     DATE: 4/30 chest tube    Renal                Foley present            []  Yes          [x]  No                                            Heme  DVT Prophylaxis            [x]  Yes           []  No                    Anticoagulation        [x]  Yes           []  No       ID   Febrile                     []  Yes          [x]  No        Endocrine   Off insulin drip         []  Yes           []  No      BS controlled           []  Yes           []  No      _____________________________________________________________________________________         Barriers to discharge:   2 view not done    Anticipated discharge date:   5/1    Priority  discharge:  []  Yes            [x]  No

## 2022-02-10 ENCOUNTER — Encounter (INDEPENDENT_AMBULATORY_CARE_PROVIDER_SITE_OTHER): Payer: Self-pay

## 2022-02-10 DIAGNOSIS — Z951 Presence of aortocoronary bypass graft: Secondary | ICD-10-CM

## 2022-02-10 LAB — CBC AND DIFFERENTIAL
Absolute NRBC: 0 10*3/uL (ref 0.00–0.00)
Basophils Absolute Automated: 0.01 10*3/uL (ref 0.00–0.08)
Basophils Automated: 0.2 %
Eosinophils Absolute Automated: 0.06 10*3/uL (ref 0.00–0.44)
Eosinophils Automated: 1.1 %
Hematocrit: 21.7 % — ABNORMAL LOW (ref 37.6–49.6)
Hgb: 7.7 g/dL — ABNORMAL LOW (ref 12.5–17.1)
Immature Granulocytes Absolute: 0.01 10*3/uL (ref 0.00–0.07)
Immature Granulocytes: 0.2 %
Instrument Absolute Neutrophil Count: 3.31 10*3/uL (ref 1.10–6.33)
Lymphocytes Absolute Automated: 1.52 10*3/uL (ref 0.42–3.22)
Lymphocytes Automated: 28.4 %
MCH: 32.4 pg (ref 25.1–33.5)
MCHC: 35.5 g/dL (ref 31.5–35.8)
MCV: 91.2 fL (ref 78.0–96.0)
MPV: 9.2 fL (ref 8.9–12.5)
Monocytes Absolute Automated: 0.45 10*3/uL (ref 0.21–0.85)
Monocytes: 8.4 %
Neutrophils Absolute: 3.31 10*3/uL (ref 1.10–6.33)
Neutrophils: 61.7 %
Nucleated RBC: 0 /100 WBC (ref 0.0–0.0)
Platelets: 198 10*3/uL (ref 142–346)
RBC: 2.38 10*6/uL — ABNORMAL LOW (ref 4.20–5.90)
RDW: 13 % (ref 11–15)
WBC: 5.36 10*3/uL (ref 3.10–9.50)

## 2022-02-10 LAB — GFR: EGFR: >60

## 2022-02-10 LAB — BASIC METABOLIC PANEL
Anion Gap: 7 (ref 5.0–15.0)
BUN: 7 mg/dL — ABNORMAL LOW (ref 9.0–28.0)
CO2: 27 mEq/L (ref 17–29)
Calcium: 8.8 mg/dL (ref 8.5–10.5)
Chloride: 100 mEq/L (ref 99–111)
Creatinine: 0.9 mg/dL (ref 0.5–1.5)
Glucose: 96 mg/dL (ref 70–100)
Potassium: 4.5 mEq/L (ref 3.5–5.3)
Sodium: 134 mEq/L — ABNORMAL LOW (ref 135–145)

## 2022-02-10 LAB — MAGNESIUM: Magnesium: 2 mg/dL (ref 1.6–2.6)

## 2022-02-10 MED ORDER — SENNOSIDES-DOCUSATE SODIUM 8.6-50 MG PO TABS
2.0000 | ORAL_TABLET | Freq: Every evening | ORAL | 0 refills | Status: AC | PRN
Start: 2022-02-10 — End: 2022-02-17

## 2022-02-10 MED ORDER — ASPIRIN EC 81 MG PO TBEC
81.0000 mg | DELAYED_RELEASE_TABLET | Freq: Every day | ORAL | 0 refills | Status: AC
Start: 2022-02-10 — End: 2022-03-12

## 2022-02-10 MED ORDER — FUROSEMIDE 20 MG PO TABS
20.0000 mg | ORAL_TABLET | Freq: Every day | ORAL | 0 refills | Status: AC
Start: 2022-02-10 — End: 2022-02-15

## 2022-02-10 MED ORDER — CLOPIDOGREL BISULFATE 75 MG PO TABS
75.0000 mg | ORAL_TABLET | Freq: Every day | ORAL | 0 refills | Status: DC
Start: 2022-02-10 — End: 2022-02-21

## 2022-02-10 MED ORDER — TAMSULOSIN HCL 0.4 MG PO CAPS
0.4000 mg | ORAL_CAPSULE | Freq: Every day | ORAL | 0 refills | Status: DC
Start: 2022-02-10 — End: 2022-02-18

## 2022-02-10 MED ORDER — MAGNESIUM SULFATE IN D5W 1-5 GM/100ML-% IV SOLN
1.0000 g | Freq: Once | INTRAVENOUS | Status: AC
Start: 2022-02-10 — End: 2022-02-10
  Administered 2022-02-10: 1 g via INTRAVENOUS
  Filled 2022-02-10: qty 100

## 2022-02-10 MED ORDER — TRAMADOL HCL 50 MG PO TABS
50.0000 mg | ORAL_TABLET | Freq: Four times a day (QID) | ORAL | 0 refills | Status: AC | PRN
Start: 2022-02-10 — End: 2022-02-15

## 2022-02-10 MED ORDER — NICOTINE 14 MG/24HR TD PT24
1.0000 | MEDICATED_PATCH | Freq: Every day | TRANSDERMAL | 0 refills | Status: DC
Start: 2022-02-10 — End: 2022-02-21

## 2022-02-10 MED ORDER — ACETAMINOPHEN 325 MG PO TABS
650.0000 mg | ORAL_TABLET | Freq: Four times a day (QID) | ORAL | 0 refills | Status: AC | PRN
Start: 2022-02-10 — End: 2022-02-17

## 2022-02-10 MED ORDER — AMIODARONE HCL 200 MG PO TABS
200.0000 mg | ORAL_TABLET | Freq: Every day | ORAL | 0 refills | Status: DC
Start: 2022-02-10 — End: 2022-02-21

## 2022-02-10 MED ORDER — GABAPENTIN 100 MG PO CAPS
200.0000 mg | ORAL_CAPSULE | Freq: Three times a day (TID) | ORAL | 0 refills | Status: AC
Start: 2022-02-10 — End: 2022-02-15

## 2022-02-10 MED ORDER — FUROSEMIDE 20 MG PO TABS
20.0000 mg | ORAL_TABLET | Freq: Every day | ORAL | Status: DC
Start: 2022-02-10 — End: 2022-02-10
  Administered 2022-02-10: 20 mg via ORAL
  Filled 2022-02-10: qty 1

## 2022-02-10 NOTE — Progress Notes (Signed)
Start [facility] Note  Home Health Referral    Referral from Lessie Dings   (Case Manager) for home health care upon discharge.    By [employer], the patient has the right to freely choose a home care provider.    A company of the patients choosing. We have supplied the patient with a listing of providers in your area who asked to be included and participate in Medicare.   [facility], formerly  [facility], a home care agency that provides adult home care services and participates in Medicare   The preferred provider of your insurance company. Choosing a home care provider other than your insurance company's preferred provider may affect your insurance coverage.      Home Health Discharge Information    Your doctor has ordered Skilled Nursing in-home service(s) for you while you recuperate at home, to assist you in the transition from hospital to home.    The agency that you or your representative chose to provide the service:  Name of Home Health Agency Placement: Other (comment box) (To be determined)      The above services were set up by:  Ladell Bey L. Romeo Apple, RN [facility] Liaison) Phone [phone]      IF YOU HAVE NOT HEARD FROM YOUR HOME YOUR HOME HEALTH AGENCY WITHIN 48 HOURS AFTER DISCHARGE PLEASE CALL YOUR AGENCY TO ARRANGE A TIME FOR YOUR FIRST VISIT. FOR ANY SCHEDULING CONCERNS OR QUESTIONS RELATED TO HOME HEALTH, SUCH AS TIME OR DATE PLEASE CONTACT YOUR HOME HEALTH AGENCY AT THE NUMBER LISTED ABOVE.    Additional comments:Patient agrees to home health services listed above. Patient agrees to use home health agency in network with patient's insurance.  Home health liaison will contact patient with name of agency once secured          START PATIENT REGISTRATION INFORMATION     Order Information  Order Signing Physician: ***     Service Ordered RN ?: {YES/NO:21936}  Service Ordered PT ?: {YES/NO:21936}  Service Ordered OT ?: {YES/NO:21936}  Service Ordered ST ?: {YES/NO:21936}  Service  Ordered MSW?: {YES/NO:21936}  Service Ordered HHA?: {YES/NO:21936}  Following Physician: ***  Following Physician Phone: ***  Overseeing Physician: {N/A or Name:35908::"N/A"}  (Required for Residents only)   Agreeable to Follow?: {YES/NO:21936}  Spoke with: ***  Date/Time of Call: 02/10/22 10:12 AM      Care Coordination   SOC Call from [facility] Required?: {YES:25569::"no"}  Same Day [facility]?: {YES:25569::"no"}  Primary Care Physician:PCP None, MD  Primary Care Physician Phone:None  Primary Care Physician Address: None  PCP NPI:   Visit Instructions: {N/A or Name:35908::"N/A"}  Service Discharge Location Type: {Discharge service location:59418::"Home"}  Service Facility Name: {N/A or Name:35908::"N/A"}  Service Floor Facility: {N/A or Name:35908::"N/A"}  Service Room No: {N/A or Name:35908::"N/A"}    Demographics  Patient Last Name: [name]   Patient First Name: Erik [name]  Language/Communication Barrier: {Language Barriers:200003}  Service Address: [address]  [place] [address]   Service Home Phone: [phone] (home)   Other phone numbers:    Telephone Information:   Mobile [phone]     Emergency Contact: Extended Emergency Contact Information  Primary Emergency Contact: Snow,Margaret  Mobile Phone: [phone]  Relation: Mother  Preferred language: English  Interpreter needed? No  Primary Phone Number:  ***  Secondary Phone Number:  ***    Admission Information  Admit Date: 02/05/2022  Patient Status at discharge: {PTSTATUSJENN:59595::"Inpatient"}  Admitting Diagnosis: Coronary artery disease  involving native coronary artery of native heart without angina pectoris [I25.10]  Pre-operative cardiovascular examination [Z01.810]     Caregiver Information  Caregiver First Name: {N/A or Name:35908::"N/A"}  Caregiver Last Name: {N/A or Name:35908::"N/A"}  Caregiver Relationship to Patient: {SW Caregiver Relationship:24444}  Caregiver Phone Number: {N/A or Name:35908::"N/A"}  Caregiver Notes: {N/A or  Name:35908::"N/A"}            Insurance Information    Primary Insurance Information  Primary Subscriber:   Primary Subscriber Relation To Guarantor:   Primary Payor:   Primary Plan:   Primary Group #:    Primary Subscriber ID:    Primary Subscriber DOB:   Secondary Insurance Information  Secondary Subscriber:   Secondary Subscriber Relation To Guarantor:   Secondary Payor:   Secondary Plan:   Secondary Group #:   Secondary Subscriber ID:   Secondary Subscriber DOB:   HITECH  {HITECHLIST2:59531}      END PATIENT REGISTRATION INFORMATION       Diagnosis: ***    Start PACC Summary      COVID-19 Screening:    COVID-19   {COVIDTEST:59454}                                                                             If positive or result pending, is patient agreeable to wearing PPE during visit? {YES (DEF) Z/OX:09604}/NA:26980}    Additional Comments: ***    End PACC Summary     Discharge Date:  ***    Referral Source  Signed by: Mattie Novosel L. Romeo AppleHarrison, RN  Date Time: 02/10/22 10:12 AM      End PACC Note

## 2022-02-10 NOTE — Discharge Instr - AVS First Page (Addendum)
MY CARDIAC SURGERY        The operation I had was : Coronary Artery Bypass Graft x 3 Vessels        My Incisions are located: Chest, Leg        I needed this operation to: Restore blood flow to heart        My cardiac surgeon is: Austin Miles, MD        My ejection fraction is: 50% (a measure of how well my heart pumps - normal is 55-60%)  SEE ACCOMPANYING PURPLE FORM IN YOUR DISCHARGE PACKET   ([facility] CARDIAC SURGERY DISCHARGE INSTRUCTIONS)      Kicking the Smoking Habit  If you smoke, quitting is one of the best changes you can make for your heart. Your risk of heart attack goes down within one day of putting out that last cigarette. As you go longer without smoking, your risk goes down even more. Quitting isn't easy, but millions of people have done it. You can, too. It's never too late to quit.  Getting started  Boost your chances of success by deciding on your "quit plan." Your health care provider and cardiac rehab team can help you develop this plan. Even if you've already quit, it's easy to slip back into smoking.  Your plan can help you avoid and recover from relapse.  In any case, start by setting a date to quit within a month, and do it.    Keys to your quit plan  Talk to your health care provider about prescription medications and nicotine replacement products that help stop the urge to smoke.   Join a support group or quit smoking program. Talking with others about the challenges of quitting can help you get through them.  Ask other smokers in your household to quit with you.  Track your triggers  What gives you that "I-need-a-cigarette" feeling? List all the situations that make you want a cigarette. Then think of other ways to deal with these situations. Here are some examples:     Tips for quitting successfully  List the benefits of quitting such as reducing heart risks and saving money. Keep this list and review it whenever you feel like smoking.  Get support. Let your friends know you  may call them to chat when you have an urge to smoke.  If you've tried to quit before without success, this time avoid the triggers that may cause the relapse.  Make the most of slip-ups. Try to learn from them, and then get back on track.  For family and friends  Be supportive and patient. Quitting smoking can be difficult and stressful.  If you smoke, now's a great time to quit. Even if you don't quit, never smoke around your loved one. Secondhand smoke is dangerous to his or her heart.   [address] [employer] [address], [place], [place] [address]. All rights reserved. This information is not intended as a substitute for professional medical care. Always follow your healthcare professional's instructions.        Home Health Discharge Information    Your doctor has ordered Skilled Nursing in-home service(s) for you while you recuperate at home, to assist you in the transition from hospital to home.    The agency that you or your representative chose to provide the service:  Name of Home Health Agency Placement: Other (comment box) (To be determined)      The above services were set up  by:  Zorita Pang. Romeo Apple, RN [facility] Liaison) Phone [phone]      IF YOU HAVE NOT HEARD FROM YOUR HOME YOUR HOME HEALTH AGENCY WITHIN 48 HOURS AFTER DISCHARGE PLEASE CALL YOUR AGENCY TO ARRANGE A TIME FOR YOUR FIRST VISIT. FOR ANY SCHEDULING CONCERNS OR QUESTIONS RELATED TO HOME HEALTH, SUCH AS TIME OR DATE PLEASE CONTACT YOUR HOME HEALTH AGENCY AT THE NUMBER LISTED ABOVE.    Additional comments:Patient agrees to home health services listed above. Patient agrees to use home health agency in network with patient's insurance.  Home health liaison will contact patient with name of agency once secured

## 2022-02-10 NOTE — Progress Notes (Signed)
02/10/22 0902   Discharge Disposition   Patient preference/choice provided? Yes   Physical Discharge Disposition Home, Home Health   Name of Home Health Agency Placement   [facility], [facility], [facility] following)   Mode of Transportation Car   Patient/Family/POA notified of transfer plan Yes   Patient agreeable to discharge plan/expected d/c date? Yes   Family/POA agreeable to discharge plan/expected d/c date? Yes   Bedside nurse notified of transport plan? Yes   CM Interventions   Follow up appointment scheduled? Yes   Is this a Medicare focused or COVID patient? No   Is this appointment within 48-72 hours? No   Referral made for home health RN visit? Yes   Multidisciplinary rounds/family meeting before d/c? Yes   Medicare Checklist   Is this a Medicare patient? No     RNCM met with patient at bedside. Patient does not have a PCP for follow-up care. Verbal consent obtained from patient for referral to [facility] service.   [facility] referral entered.   Patient has family/friend who will transport patient home.   Patient verbalized understanding of [facility] plan and voiced no questions or concerns.     Andrez Grime  Case Manager  [phone]

## 2022-02-10 NOTE — Plan of Care (Signed)
Date/Time:  02/10/22 1:47 AM   Patient Name:  Erik [name]    Age: 45 y.o.   Room:  FI264/FI264-01     Cardiac Surgery RN Shift   Shift Events:   - Patient remains on 1000 mL free water restriction due to hyponatremia. Patient voided around 2 liters. Vital signs stable.     Neuro  Ambulate with          []  assist       [x]  independently  PT/OT Dispo recs    Home  Pain Controlled        [x]  Yes           []  No           Cardiac  BP Controlled [x]  Yes          []  No          PRN meds given []  Yes            [x]  No      Rhythm                    Sinus rhythm      Arrhythmias         []  Yes            [x]  No      Pacing Wires           []  Yes           [x]  No                           Pulmonary                     O2 NC                      []  Yes           [x]  No                    Chest tubes              []  Yes           [x]  No                                                                            IS                              1000 mL    G.I.   Tolerating Diet          [x]  Yes           []  No      BM last 24 hrs          [x]  Yes          []  No               Last BM Date: 02/09/22    PPI ppx                    [  x] Yes          []  No      INTEGUMENTARY                Incisions OTA?          [x]  Yes           []  No                   Dressing Change?      []  Yes           []  No       [x]  N/A        Renal                Foley present            []  Yes          [x]  No                                            Heme  DVT Prophylaxis            [x]  Yes           []  No                    Anticoagulation        [x]  Yes           []  No       ID   Febrile                     []  Yes          [x]  No        Endocrine   Off insulin drip         [x]  Yes           []  No      BS controlled           [x]  Yes           []  No      _____________________________________________________________________________________         Barriers to discharge:   Hyponatremia - will check AM labs.       Anticipated discharge date:    02/10/2022    Priority discharge:  []  Yes            [x]  No           Problem: Moderate/High Fall Risk Score >5  Goal: Patient will remain free of falls  Outcome: Progressing     Problem: Post-op Phase - Cardiac Surgery  Goal: Effective breathing pattern is maintained  Outcome: Progressing  Goal: Patient will remain free from post-op complications  Outcome: Progressing  Goal: Mobility/activity is maintained at optimal level  Outcome: Progressing  Goal: Nutritional intake is adequate  Outcome: Progressing     Problem: Compromised Tissue integrity  Goal: Damaged tissue is healing and protected  Outcome: Progressing  Goal: Nutritional status is improving  Outcome: Progressing

## 2022-02-10 NOTE — Discharge Summary -  Nursing (Signed)
Reviewed the following discharge instructions with patient and family: sternal precautions, cardiac diet, follow-up appointments with surgeon, cardiologist, & primary care physician, prescriptions and their common side effects, med admin guide with when next doses due, incision care, symptoms of infection, daily vital signs & weight, activity restrictions, ambulation, home care visits, and cardiac rehabilitation. Also reviewed with patient when to seek further treatment. Pt/family did not have any further questions. IV's removed, tele d/c'd, & pt belongings gathered. Pt was discharged via wheelchair.

## 2022-02-10 NOTE — Plan of Care (Signed)
Problem: Moderate/High Fall Risk Score >5  Goal: Patient will remain free of falls  Outcome: Adequate for Discharge     Problem: Post-op Phase - Cardiac Surgery  Goal: Effective breathing pattern is maintained  Outcome: Adequate for Discharge  Goal: Patient will remain free from post-op complications  Outcome: Adequate for Discharge  Goal: Mobility/activity is maintained at optimal level  Outcome: Adequate for Discharge  Goal: Nutritional intake is adequate  Outcome: Adequate for Discharge     Problem: Compromised Tissue integrity  Goal: Damaged tissue is healing and protected  Outcome: Adequate for Discharge  Goal: Nutritional status is improving  Outcome: Adequate for Discharge

## 2022-02-10 NOTE — Progress Notes (Signed)
[facility] for [employer] (previously [facility]):     Received a referral to schedule a follow up appointment with the [facility] for [employer].  Appointment scheduled for Monday 02/24/22 at 10:40 am with Dr. Rachel Moulds at [place] location.      Clinic address is as follows:    [address] [place], [address]    Please notify patient to arrive 15 minutes early to the appointment and bring the following materials with them:    Insurance card (if insured) and photo ID  Medications in their original bottles  Glucometer/blood sugar log (if diabetic)  Weight log (if heart failure)  Proof of income (to enroll in medication assistance programs-first two pages of signed 1040 tax forms or last 2 months of pay stubs)      Dawayne Patricia  Patient Access Associate II  [facility] for [employer]   [phone]

## 2022-02-10 NOTE — Progress Notes (Signed)
Cardiac surgery post-op and discharge instructions folder provided to patient and reviewed the following information with patient / family:  1-Movement Guidelines -Moving in The Tube (Handout provided) / activity restrictions  2-Cardiac / consistent carbohydrate diet (handout provided)   3-Daily Incision care post-cardiac surgery (using Dove sensitive Skin Body wash) and signs and symptoms of infection  4-Use of incentive spirometer (10 times/ hour) while awake, with return demonstration  5-Checking vital signs daily including:  weight, temperature, blood pressure (BP) and heart rate (HR)  (before taking medications which can affect BP and / or HR),    6-When to call the surgeon's office / or 911 (in case of an emergency)  7-Medications and side effects (handout provided and reviewed),   8-Cardiac Rehab reviewed with patient/ family and handout provided   (Patient declines at this time, states he will be possibly moving to [place] or [place] in the next few weeks, and will decide at that time.)  9-Follow up appointments post-surgery with Cardiac Surgery Nurse Practitioner and cardiologist . (Referred to [facility] since he does not have a PCP at this time.  10-Smoking cessation discussed with patient  Verbalize understanding, have no further questions at this time.      Juliane Poot MSN, RN, PCCN, CHTP  Interim Patient Care Navigator  Cardiovascular Stepdown Unit  [facility]

## 2022-02-11 ENCOUNTER — Telehealth: Payer: Self-pay

## 2022-02-11 NOTE — Telephone Encounter (Signed)
Discharge follow-up call placed by [facility] Unit Supervisor/Charge Nurse Alen Bleacher with: Mr. Flythe   General:     How are you feeling? "OK"    Walking/using IS Yes / Yes-  but states that he hasn't used the incentive spirometer today .  (Reviewed information)    Have you had any shortness of breath or felt your heart racing?  Not at rest, only a little shortness of breath with activity which resolves with rest.    Can you rate your pain on scale 0-10? 6/10, states taking pain medications as instructed.    Have you called to make your follow up appointments yet?  Yes    Have you been contacted/visited by Home Health? Not yet   Medications:       Have you gotten all of your prescriptions filled? Yes, but still need to pick up some of the medications from the pharmacy today.     Does the patient demonstrate understanding of what medications to take and when? Yes   Have you had a bowel movement yet   Yes    Have you started filling out your vitals and activity log for the time you've been home?  Yes   BP: 129/59                                            HR:103   Glucose : N/A   Temp: 98.4    Weight: 179.4 lbs.   Incision care:        Have you examined your incisions today? Yes    Is there any increased redness or drainage? No, just a little drainage from the chest tube sites  (normal for few days after chest tubes are removed)    Does patient demonstrate understanding of where incisions are located and how to care for them? Yes    Does patient understand importance of "Moving in - The Tube" Yes    Contact numbers and Follow Up:     Do you know how to contact the surgeon's office in an emergency? Yes    Do you know when you should call 911 instead? Yes    Do you have your contact information for cardiac rehab? Yes   Are you wearing your bracelet with the contact #? Yes    Does the patient have any further questions/issues? No   Instructed to call Cardiac Surgery office at [phone] with any problems, questions  or concerns.    Yes

## 2022-02-11 NOTE — Progress Notes (Signed)
Start [facility] Note  Home Health Referral    Referral from Erik [name] Erik [name] [name]   (Case Manager) for home health care upon discharge.    By [employer], the patient has the right to freely choose a home care provider.    A company of the patients choosing. We have supplied the patient with a listing of providers in your area who asked to be included and participate in Medicare.   [facility], formerly [facility], a home care agency that provides adult home care services and participates in Medicare   The preferred provider of your insurance company. Choosing a home care provider other than your insurance company's preferred provider may affect your insurance coverage.      Home Health Discharge Information  [facility] Case Management has approved for  __4__ SN visits.  Your doctor has ordered Skilled Nursing in-home service(s) for you while you recuperate at home, to assist you in the transition from hospital to home.    The agency that you or your representative chose to provide the service:  Name of Home Health Agency Placement: [facility] [phone]      The above services were set up by:  Erik [name] Gilliam L. Romeo Apple, RN [facility] Liaison) Phone [phone]      IF YOU HAVE NOT HEARD FROM YOUR HOME YOUR HOME HEALTH AGENCY WITHIN 48 HOURS AFTER DISCHARGE PLEASE CALL YOUR AGENCY TO ARRANGE A TIME FOR YOUR FIRST VISIT. FOR ANY SCHEDULING CONCERNS OR QUESTIONS RELATED TO HOME HEALTH, SUCH AS TIME OR DATE PLEASE CONTACT YOUR HOME HEALTH AGENCY AT THE NUMBER LISTED ABOVE.    Additional comments:Patient agrees to home health services listed above. Patient's insurance terminated in April. Spoke to case manager, Erik [name] Erik [name] [name], and patient was approved for SNx4 visits under Systems case management.         START PATIENT REGISTRATION INFORMATION     Order Information  Order Signing Physician: Erik [name] Erik [name] [name], cardiac surgeon     Service Ordered RN ?: Yes  Service Ordered PT ?: No    Service Ordered OT  ?: No    Service Ordered ST ?: No    Service Ordered MSW?: No    Service Ordered HHA?: No    Following Physician: Erik [name] Erik [name] [name], cardiac surgeon     Following Physician Phone: [phone]  Overseeing Physician: N/A  (Required for Residents only)   Agreeable to Follow?: Yes  Spoke with: Erik [name] Spearing, NP for CV surgeons  Date/Time of Call: 02/11/22 11:18 AM      Care Coordination   SOC Call from [facility] Required?: no  Same Day [facility]?: no  Primary Care Physician:PCP None, MD  Primary Care Physician Phone:None  Primary Care Physician Address: None  PCP NPI:   Visit Instructions: N/A  Service Discharge Location Type: Home  Service Facility Name: N/A  Service Floor Facility: N/A  Service Room No: N/A    Demographics  Patient Last Name: [place] Erik [name] [name]   Patient First Name: Erik [name] Erik [name] [name]  Language/Communication Barrier: none   Service Address: [address]  [place] [address]   Service Home Phone: [phone] (home)   Other phone numbers:    [phone] mother, Erik [name] Erik [name] [name]  Emergency Contact: Extended Emergency Contact Information  Primary Emergency Contact: [name],Erik [name]  Mobile Phone: [phone]  Relation: Mother  Preferred language: English  Interpreter needed? No  Primary Phone Number:  [phone] cell  Secondary Phone Number:  [phone] mother, Erik [name] Erik [name] [name]    Admission Information  Admit  Date: 02/05/2022  Patient Status at discharge: Inpatient  Admitting Diagnosis: Coronary artery disease involving native coronary artery of native heart without angina pectoris [I25.10]  Pre-operative cardiovascular examination [Z01.810]     Caregiver Information  Caregiver First Name: Erik [name] Erik [name] [name]  Caregiver Last Name: Erik [name] Erik [name] [name]  Caregiver Relationship to Patient: Parent  Caregiver Phone Number: [phone]   Caregiver Notes: Mother is staying with patient.            Psychologist, counselling Subscriber:   Primary Subscriber Relation To Guarantor:   Primary Payor:   Primary Plan:   Primary Group #:     Primary Subscriber ID:    Primary Subscriber DOB:   Secondary Insurance Information  Secondary Subscriber:   Secondary Subscriber Relation To Guarantor:   Secondary Payor:   Secondary Plan:   Secondary Group #:   Secondary Subscriber ID:   Secondary Subscriber DOB:   HITECH  NO      END PATIENT REGISTRATION INFORMATION       Diagnosis:   CAD  S/P Procedure 02/05/22 CABG x3  HTN  HLD  Smoker  Heavy ETOH use  Depression    Start [facility] Summary      COVID-19 Screening:    COVID-19   Not Tested                                                                                 Additional Comments:   [facility] Case Management has approved for  __4__ SN visits.  48 hour cardiac surgery patient  ICTS standing orders      End [facility] Summary     Discharge Date:  02/10/22  [facility] Date:02/12/22      Referral Source  Signed by: Erik [name] Erik [name] [name] L. Romeo Apple, RN  Date Time: 02/11/22 11:18 AM      End [facility] Note         Home Health face-to-face (FTF) Encounter (Order [id number])  Consult  Date: 02/11/2022 Department[facility] Ordering/Authorizing: Erik [name] Miles, MD     Order Information    Order Date/Time Release Date/Time Start Date/Time End Date/Time   02/11/22 11:46 AM None 02/11/22 11:44 AM 02/11/22 11:44 AM     Order Details    Frequency Duration Priority Order Class   Once 1  occurrence Routine Hospital Performed     Standing Order Information    Remaining Occurrences Interval Last Released     0/1 Once 02/11/2022              Provider Information    Ordering User Ordering Provider Authorizing Provider   Erik [name] Band, RN Erik [name] Miles, MD Erik [name] Miles, MD   Attending Provider(s) Admitting Provider PCP   Erik [name] Miles, MD Erik [name] Miles, MD Pcp, None, MD     Verbal Order Info    Action Created on Order Mode Entered by Responsible Provider Signed by Signed on   Ordering 02/11/22 1146 Per protocol: cosign required Erik [name] Band, RN Erik [name] Miles, MD             Comments    Additional orders: [facility] Case Management has approved for  __4__ SN visits.   Surgeon requests SOC to be second full day home after [place] from hospital      ICTS standing orders:     ROUTINE ORDERS FOR [place] [facility]   [place] HOME HEALTH NURSING INSTRUCTIONS FOR CARDIAC SURGERY PATIENTS     Notify the surgeon for:   Oral temperature >100.5 (F)   Heart rate <50 or over 100 at rest   Blood pressure systolic <90 or >150, diastolic <50 or >90   Respirations <12 or >25   Orthostatic hypotension: Systolic & or more, Diastolic I 10 mmH, or HR 1-20 bpm or more   Fasting capillary blood glucose <70 or >250   Weight gain over 3 lbs. in 24 hours or >5 lbs in one week   Excessive tiredness or weakness   Shortness of breath (particularly at rest)   Angina-like chest pain   Nausea or vomiting   Excessive swelling of legs   Excessive swelling of arms (if radial artery used)   Increased redness, tenderness or painful incision, especially if accompanied by drainage or fever   Blood in urine or stool   Oxygen saturation <90%   No BM for 3 days following discharge     Routine Incision and Dressing Care (Sternal, leg, arm, chest tube sites):   Patients should shower daily. Remove plain gauze dressings if present and use liquid body wash (Dove   sensitive skin is recommended). Use a separate clean washcloth for each incision area. Use a clean   towel to dry.   Patients with Prevena dressings should keep them on until postoperative day 7. Patients can shower   with the Prevena on but should avoid direct spraying of water on the dressing. Home health RN to   remove Prevena on postoperative day 7, if it is still in place.   Patients with Dermabond Prineo mesh dressing should leave it in place until it falls off. Peeled up edges   can be trimmed carefully with clean scissors. Patients with Prineo dressing can shower, and get the   dressing wet.   Do not apply lotions, creams, powder, or ointments to incisions   Do not try and close  chest tube sites with Steri-strips   If chest tube sutures are present, RN may remove 48 hours after the start of home health care.   If sternal, chest tube sites, or graft harvest sites are dry and intact, keep open to air. If sternal, chest   tube sites, or graft sites are draining, cover with dry gauze and tape. Change daily, or as needed, until   newly epithelialized, then leave open to air. May instruct care giver in wound care.   Advise patients with any leg swelling to elevate legs, ideally higher than the level of the heart, or to a   comfortable level.   If incisions have increased drainage, necrotic tissue, foul odor, or if new open wounds are present,   notify the cardiac surgery office immediately.   Upper extremities that are swollen due to radial artery harvest should be elevated when possible. If   the fingers of the hand from which the radial artery was harvested are cold, pale, or have poor capillary   refill, notify the cardiac surgery office immediately. If any signs of infection or moderate to severe   swelling, notify the cardiac surgery office.     Routine Care Orders (per visit):   VS, heart rhythm, wound inspection   Apical  pulse for 1 full minute   Pulmonary: incentive spirometer 10x/hour while awake, assess breath sounds   Activities: encourage out of bed during the day, daily showers   "Loosening up" exercises - continue with activity restrictions given to patient in discharge folder or by PT/OT (move within the tube). If lifting > 10 Ibs, ensure patient is moving within the tube as taught in the hospital.   Diet: no added salt for the first 6 weeks, then "Heart Healthy Diet"   Medications: per Epic AVS. Patient will have a printed copy.   Bowel movements: may use Pericolace 1-2 tabs PO BID prn constipation. If no BM by day 3, Milk of Magnesia 2 TBSP PO daily prn constipation   Pain: assess all systems   Pulse oximetry PRN for SOB, tachycardia, and/or to assess patient's tolerance of  activity   Driving: do not drive until cleared at office visit   Reinforce "move within the tube"   Assess and reinforce fall precautions     Lab Work   As ordered at discharge   If PT/INR is ordered, obtain finger stick, may do venipuncture if necessary   An attempt should be made to complete coagulation tests early in the day, as same day results are expected prior to close of office by 1700. If this cannot be accomplished, please notify managing physician ASAP so that a dosing plan can be established with the patient prior to closing that day.   If visiting RN needs to contact office personnel regarding problems, and wish to add lab work to the orders please call directly for new orders (i.e. urinary symptoms: get UA/C&S; for fever/infection: get CBC with differential)     Miscellaneous   Home health RN should ensure that patient has follow up appointments made with his/her primary care doctor and cardiologist if these were not made prior to discharge.     Home Care Visits   Patients should receive 2 visits per week for 2 weeks, then 1 visit a week for 2 weeks.     QUESTIONS: For any questions regarding the number of visits, please call the cardiac surgery office APP at [phone] M-F 9 AM-5 PM. All other hours (evenings, weekends, and holidays) please call the surgeons' answering service at [phone] to reach the on-call provider.     Diagnosis:   CAD   S/P Procedure 02/05/22 CABG x3   HTN   HLD   Smoker   Heavy ETOH use   Depression       Home nursing required for skilled assessment including post-operative assessment and instruction, incision assessment and instructions re: S/S infection, cardiopulmonary assessment and dietary education for disease management, and medication instruction.                Home Health face-to-face (FTF) Encounter: Patient Communication     Not Released  Not seen         Order Questions    Question Answer   Date I saw the patient face-to-face: 02/10/2022   Evidence this patient  is homebound because: A.  Post operative restrictions, weight bearing status impedes mobility > 5 feet    C.  Decreased endurance, strength, ROM, cadence, safety/judgment during mobility    G.  Fall risk due to impaired coordination, gait and decreased balance    I.  Restricted to home to decrease risk of infection    N.  Impaired mobility d/t pain, arthritis, weakness that compromises patient safety   Medical conditions that  necessitate Home Health care: A.  Post operative procedure requiring follow up care & monitoring for complication    B.  Functional impairment due to recent hospitalization/procedure/treatment    C.  Risk for complication/infection/pain requiring follow up and monitoring    E.  Exacerbation of disease requiring follow up monitoring    H.  Multiple new medications requiring management and monitoring   Per clinical findings, following services are medically necessary: Skilled Nursing   Clinical findings that support the need for Skilled Nursing. SN will: A. Educate on post operative care, restrictions & management of complications    B. Monitor post operative site for infection and educate on care management    C. Monitor for signs and symptoms of exacerbation of disease and management    D. Review medication reconciliation, manage and educate on use and side effects    H. Assess cardiopulmonary status and monitor for signs &symptoms of exacerbation    I.  Educate dietary and or fluid restrictions and weight management    M. Instruct use of inhalers and/ or nebulizers, incentive spirometer   Other (please specify) see comments                   Dr. Dayna Barker Singh___(ICTS surgeon) [phone] will be attending until discharged from surgeon.                    Process Instructions    Please select Home Care Services medically necessary.     Based on the above findings, I certify that this patient is confined to the home and needs intermittent skilled nursing care, physical therapry and / or speech  therapy or continues to need occupational therapy. The patient is under my care, and I have initiated the establishment of the plan of care. This patient will be followed by a physician who will periodically review the plan of care.      Collection Information            Consult Order Info    ID Description Priority Start Date Start Time   [id number] Home Health face-to-face (FTF) Encounter Routine 02/11/2022 11:44 AM   Provider Specialty Referred to   ______________________________________ _____________________________________                         Verbal Order Info    Action Created on Order Mode Entered by Responsible Provider Signed by Signed on   Ordering 02/11/22 1146 Per protocol: cosign required Erik [name] Band, RN Erik [name] Miles, MD             Patient Information    Patient Name   Erik [name] Erik [name] [name], Erik [name] Erik [name] [name] Legal Sex   Male DOB   [birth year]       Reprint Order Requisition    Home Health face-to-face (FTF) Encounter (Order #[id number]) on 02/11/22       Additional Information    Associated Reports External References   Priority and Order Details [employer]

## 2022-02-12 LAB — GLUCOSE WHOLE BLOOD - POCT: Whole Blood Glucose POCT: 92 mg/dL (ref 70–100)

## 2022-02-13 NOTE — Progress Notes (Signed)
Cardiac Surgery Post Operative    Patient Name: Erik [name] Age: 46 y.o.   Today's Date: 02/18/2022  Gender: male   Date of Birth: [birth year] Race: White   Surgeon: Austin Milesamesh Singh MD Surgery: CABG x 3 (LIMA-LAD, SVG-PDA, L Radial-OM1), PFO closure   Referring Physician(s): Trellis MomentLindsey Cilia, MD Primary Care Physician: Lianne BushyVerderese, John Paul, MD   Date of Surgery: on February 05, 2022 Discharge Date: on Feb 10, 2022     Hospital Course:  46 y/o male who was recently evaluated in Dec 22' w/ c/o of worsening exertional angina/CP, LHC 01/08/22 revealed 3 vessel disease, w/ 100% prox CTO of RCA. On 02/05/22 he was brought to the OR for elective CABG.    PMH:  HTN, HLD, CAD, hyponatremia, smoker, Heavy ETOH use, depression, TBI, hearing loss of L ear,     Daily Summary:  4/26. SDA--> OR, chest tubes oozing, required 4 PLT, 4 Cryoprecipitate, extubated    4/27: Orthostatic, d/c BB, started on CIWA, zoloft  4/28: Transferred to SDU. Start Diuresis w/ lasix . Foley replaced for retention, on Flomax  4/29: No further orthostasis, give lasix 20 IV x1. Hyponatremic, Na 129->122->126. 500 cc FWR. PW grounded  4/30: CT and PW removed by pulling  w/o incident, [place] CIWA protocol    Patient remained hemodynamically stable without episodes of Atrial Fibrillation and was discharged in sinus rhythm, average heart rate 75bpm prior to discharge. Discharged on lasix 20mg  for 5 days.    Erik [name] presents today for a post-operative visit, accompanied by mom.      Pt is accompanied by mom today. He is doing well postop.  Currently he denies fever, chills or SOB. Has minimum incisional pain. He is eating fine and has good BM. He has been using incentive spirometer frequently. He forgot to bring the postop log today but reports BP at home around 115/70.      Hx of heavy smoking and alcohol use. He is cutting down with smoking 1/2 pk a day and 6-12 beers a day.     Weight preop office 183lbs postop office 185lbs dDrinking 6-12 pk a  day    Radiology and Test Review:   CXR 02/09/22  No pneumothorax post left chest tube removal. Stable small bilateral pleural effusions.    CXR 02/08/22  Unchanged mild bibasilar atelectasis and small right pleural effusion. No new or worsening infiltrates are appreciated. There is no evidence of CHF.    CXR 02/05/22  Increased opacity along the left superior mediastinum, partially obscuring the aortic knob.  Decreased vascular congestion.  Review of Systems   Constitutional:  Positive for fatigue. Negative for chills, fever and unexpected weight change.   Respiratory:  Negative for cough and shortness of breath.    Cardiovascular:  Negative for chest pain and leg swelling.   Gastrointestinal:  Negative for constipation, nausea and vomiting.   Musculoskeletal:         Sternal pain       No Known Allergies  Current Outpatient Medications   Medication Sig Dispense Refill    amiodarone (PACERONE) 200 MG tablet Take 1 tablet (200 mg) by mouth daily for 10 days This is a maintenance dose. 10 tablet 0    aspirin EC 81 MG EC tablet Take 1 tablet (81 mg) by mouth daily 30 tablet 0    atorvastatin (LIPITOR) 40 MG tablet Take 1 tablet (40 mg) by mouth nightly 90 tablet 3    clopidogrel (PLAVIX) 75 mg  tablet Take 1 tablet (75 mg) by mouth daily 30 tablet 0    famotidine (PEPCID) 20 MG tablet Take 1 tablet (20 mg) by mouth every 12 (twelve) hours 30 tablet 0    metoprolol succinate XL (TOPROL-XL) 25 MG 24 hr tablet Take 1 tablet (25 mg) by mouth daily (Patient taking differently: Take 25 mg by mouth every morning) 90 tablet 3    nicotine (NICODERM CQ) 14 MG/24HR Place 1 patch onto the skin daily for 14 days 14 patch 0    sertraline (ZOLOFT) 50 MG tablet Take 1 tablet (50 mg) by mouth daily 30 tablet 0    tamsulosin (FLOMAX) 0.4 MG Cap Take 1 capsule (0.4 mg) by mouth Daily after dinner 30 capsule 0     No current facility-administered medications for this visit.        BP 117/72 (BP Site: Left arm, Patient Position: Sitting, Cuff  Size: Medium)   Pulse 78   Temp 98.1 F (36.7 C) (Temporal)   Resp 16   Ht 1.88 m (6\' 2" )   Wt 84 kg (185 lb 3.2 oz)   SpO2 98%   BMI 23.78 kg/m     Objective:   Physical Exam  Cardiovascular:      Rate and Rhythm: Normal rate and regular rhythm.      Heart sounds: Normal heart sounds.   Pulmonary:      Effort: Pulmonary effort is normal.      Breath sounds: Normal breath sounds.   Abdominal:      General: Bowel sounds are normal.      Palpations: Abdomen is soft.      Tenderness: There is no abdominal tenderness.   Musculoskeletal:      Right lower leg: No edema.      Left lower leg: No edema.   Skin:     Comments: Sternal incision is well approximated, c/d/I without erythema, warmth, or drainage-healing well without signs of infection-Dermabond Prineo intact    Chest tube sites c/d/I without erythema, warmth, or drainage-healing well without signs of infection    Left radial artery graft site c/d/I without erythema, warmth, or drainage-healing well without signs of infection    LLE endo vein graft site c/d/I with mild erythema, no warmth, or drainage-healing well without signs of infection    Neurological:      Mental Status: He is alert and oriented to person, place, and time.                    Labs:  Lab Results   Component Value Date    WBC 5.36 02/10/2022    HGB 7.7 (L) 02/10/2022    HCT 21.7 (L) 02/10/2022    MCV 91.2 02/10/2022    PLT 198 02/10/2022     Lab Results   Component Value Date    GLU 96 02/10/2022    BUN 7.0 (L) 02/10/2022    CREAT 0.9 02/10/2022    CA 8.8 02/10/2022    NA 134 (L) 02/10/2022    K 4.5 02/10/2022    CL 100 02/10/2022    CO2 27 02/10/2022     Lab Results   Component Value Date    ALT 25 02/06/2022    AST 37 02/06/2022    ALKPHOS 46 02/06/2022    BILITOTAL 0.6 02/06/2022         Diagnosis/Assessment/Plan:     1. S/P CABG x 3    2. Coronary artery disease of native  artery of native heart with stable angina pectoris    3. Current smoker    Status post CABG.  Day  13  --Hemodynamically stable.   --No s/s fluid overload.   --Surgical incisions are in process of healing with no s/s infection. Dermabond Prineo mesh dressing is removed today in office with no difficulty.    --No difficulty with urination. Ok to stop flomax.   -- Continue Amiodarone until finish.   -- Continue ASA, plavix, atorvastatin, and metoprolol, refill per cardiology.   --Has app with cardiologist in 2 days.   --Heavy smoking and alcohol use. Discussed smoking cessation and abstain from alcohol.   --Pt lives alone in a long term hotel. Unemployed. Lost insurance. Former Conservation officer, historic buildings.  Widowed. Has one daughter lives out state. Mother lives in [place]. Sister lives in [place]. He has app with [facility] in a week for primary care.     Routine post operative instructions reviewed with the patient including:   Increase ambulation as tolerated  Increase activities as tolerated  Continue use of incentive spirometer until activity level has returned to normal (10 reps q 1 hour while awake)  Reviewed sternal precautions; "move in the tube"; restrictions with lifting, pushing, pulling  Incisional care:  wash with soap and water daily; monitor for redness, swelling, drainage, pain/tenderness, fever/chills.  Call CV surgery office for any of the listed signs/symptoms  Call our office if increasing swelling, shortness of breath, cough, or weight gain >3 lbs overnight or 5 lbs in one week  No driving for 6 weeks post-operatively  May return to work 6-8 weeks if light duty, otherwise do not return to work for 12 weeks post-operatively  Discharged to medical follow up  Cardiac rehab recommended to begin 4-6 weeks post op  Contact our office with any questions and/or concerns as they may arise      Jacqulyn Ducking FNP

## 2022-02-18 ENCOUNTER — Encounter (INDEPENDENT_AMBULATORY_CARE_PROVIDER_SITE_OTHER): Payer: Self-pay | Admitting: Nurse Practitioner

## 2022-02-18 ENCOUNTER — Ambulatory Visit (INDEPENDENT_AMBULATORY_CARE_PROVIDER_SITE_OTHER): Payer: Commercial Managed Care - POS | Admitting: Nurse Practitioner

## 2022-02-18 VITALS — BP 117/72 | HR 78 | Temp 98.1°F | Resp 16 | Ht 74.0 in | Wt 185.2 lb

## 2022-02-18 DIAGNOSIS — Z951 Presence of aortocoronary bypass graft: Secondary | ICD-10-CM

## 2022-02-18 DIAGNOSIS — F17200 Nicotine dependence, unspecified, uncomplicated: Secondary | ICD-10-CM

## 2022-02-18 DIAGNOSIS — Z9889 Other specified postprocedural states: Secondary | ICD-10-CM

## 2022-02-18 DIAGNOSIS — I25118 Atherosclerotic heart disease of native coronary artery with other forms of angina pectoris: Secondary | ICD-10-CM

## 2022-02-18 NOTE — Patient Instructions (Addendum)
Stop flomax   F/u with Cardiologist as planned.   F/u with [facility] for primary care and financial aid/medicaid application.      Routine post operative instructions reviewed with the patient including:   Increase ambulation as tolerated  Increase activities as tolerated  Continue use of incentive spirometer until activity level has returned to normal (10 reps q 1 hour while awake)  Reviewed sternal precautions; "move in the tube"; restrictions with lifting, pushing, pulling  Incisional care:  wash with soap and water daily; monitor for redness, swelling, drainage, pain/tenderness, fever/chills.  Call CV surgery office for any of the listed signs/symptoms  Call our office if increasing swelling, shortness of breath, cough, or weight gain >3 lbs overnight or 5 lbs in one week  No driving for 6 weeks post-operatively  May return to work 6-8 weeks if light duty, otherwise do not return to work for 12 weeks post-operatively  Discharged to medical follow up  Follow up with PCP in 4 weeks  Follow up with Cardiology in 1-2 weeks  Cardiac rehab recommended to begin 4-6 weeks post op  AHA antibiotic prophylaxis with dental and certain surgical procedures recommended as per cardiology   Contact our office with any questions and/or concerns as they may arise

## 2022-02-21 ENCOUNTER — Ambulatory Visit (INDEPENDENT_AMBULATORY_CARE_PROVIDER_SITE_OTHER): Payer: Commercial Managed Care - POS | Admitting: Cardiovascular Disease

## 2022-02-21 ENCOUNTER — Encounter (INDEPENDENT_AMBULATORY_CARE_PROVIDER_SITE_OTHER): Payer: Self-pay | Admitting: Cardiovascular Disease

## 2022-02-21 VITALS — BP 108/70 | HR 84 | Wt 180.0 lb

## 2022-02-21 DIAGNOSIS — R079 Chest pain, unspecified: Secondary | ICD-10-CM

## 2022-02-21 DIAGNOSIS — E785 Hyperlipidemia, unspecified: Secondary | ICD-10-CM

## 2022-02-21 DIAGNOSIS — I2582 Chronic total occlusion of coronary artery: Secondary | ICD-10-CM

## 2022-02-21 DIAGNOSIS — Z951 Presence of aortocoronary bypass graft: Secondary | ICD-10-CM

## 2022-02-21 MED ORDER — CLOPIDOGREL BISULFATE 75 MG PO TABS
75.0000 mg | ORAL_TABLET | Freq: Every day | ORAL | 3 refills | Status: AC
Start: 2022-02-21 — End: ?

## 2022-02-21 NOTE — Progress Notes (Signed)
[place] [facility] CARDIOLOGY OFFICE PROGRESS NOTE    HRT [facility] OFFICE      [place] [facility] OFFICE -CARDIOLOGY  [address]  [place] [address]  Dept: [phone]  Dept Fax: [phone]       Patient Name: Erik [name]    Date of Visit:  Feb 21, 2022  Date of Birth: Feb 22, 1976  AGE: 46 y.o.  Medical Record #: [id number]  Requesting Physician: No primary care provider on file.      CHIEF COMPLAINT: Coronary Artery Disease      HISTORY OF PRESENT ILLNESS:    He is a pleasant 46 y.o. male  with a history of CAD s/p successful 3V CABG (LIMA-LAD, SVG-rPDA and L Radial OM1) with PFO closure on 02/05/2022, HTN, HLD who presents today for follow-up.     No chest pain. No SOB. He is continuing to cut down on smoking. He feels great. Walks a great deal around the hotel. No issues.     PAST MEDICAL HISTORY: He has a past medical history of Chest pain (12/10/2021), Coronary artery disease involving native coronary artery of native heart without angina pectoris (02/04/2022), COVID (09/2021), Hearing loss in Left ear, History of echocardiogram (10/05/2021), History of nuclear stress test: exercise SPECT abn (11/22/2021), Hyperlipidemia, Hyponatremia, and TBI (traumatic brain injury) (1999). He has a past surgical history that includes Dental surgery; LHC w/ Coronary Angios and LV (Left, 12/12/2021); LHC w/ Coronary Angios and LV (Left, 01/08/2022); CORONARY ARTERY BYPASS (N/A, 02/05/2022); ENDOSCOPIC,VEIN HARVEST (Left, 02/05/2022); ENDOSCOPIC, RADIAL ARTERY HARVEST (Left, 02/05/2022); CLOSURE, PATENT FORAMEN OVALIS (02/05/2022); and ECHOCARDIOGRAM, TRANSESOPHAGEAL (02/05/2022).    ALLERGIES: No Known Allergies    MEDICATIONS:   Patient's current medications were reviewed. ONLY Cardiac medications were updated unless others were addressed in assessment and plan.    Current Outpatient Medications:     aspirin EC 81 MG EC tablet, Take 1 tablet (81 mg) by mouth daily, Disp: 30 tablet, Rfl: 0     atorvastatin (LIPITOR) 40 MG tablet, Take 1 tablet (40 mg) by mouth nightly, Disp: 90 tablet, Rfl: 3    famotidine (PEPCID) 20 MG tablet, Take 1 tablet (20 mg) by mouth every 12 (twelve) hours, Disp: 30 tablet, Rfl: 0    metoprolol succinate XL (TOPROL-XL) 25 MG 24 hr tablet, Take 1 tablet (25 mg) by mouth daily (Patient taking differently: Take 1 tablet (25 mg) by mouth every morning), Disp: 90 tablet, Rfl: 3    sertraline (ZOLOFT) 50 MG tablet, Take 1 tablet (50 mg) by mouth daily, Disp: 30 tablet, Rfl: 0    clopidogrel (PLAVIX) 75 mg tablet, Take 1 tablet (75 mg) by mouth daily, Disp: 90 tablet, Rfl: 3     FAMILY HISTORY: family history includes COPD in his brother; Diabetes in his brother; Heart attack in his father and maternal grandfather; Hyperlipidemia in his sister; Hypertension in his sister; Hypothyroidism in his mother.    SOCIAL HISTORY: He reports that he has been smoking cigarettes. He has a 13.20 pack-year smoking history. He has never used smokeless tobacco. He reports current alcohol use. He reports that he does not use drugs.    PHYSICAL EXAMINATION    Visit Vitals  BP 108/70 (BP Site: Right arm, Patient Position: Sitting, Cuff Size: Medium)   Pulse 84   Wt 81.6 kg (180 lb)   BMI 23.11 kg/m     Constitutional: Cooperative, alert, no acute distress.  Neck: No carotid bruits, JVP normal.  Cardiac: Regular rate and  rhythm, normal S1 and S2; no S3 or S4. No murmurs. No rubs, no gallops.  Pulmonary: Clear to auscultation bilaterally, no wheezing, no rhonchi, no rales.  Extremities: no edema.  Vascular: +2 pulses in radial artery bilaterally, 2+ pedal pulses bilaterally.    LABS REVIEWED:   Lab Results   Component Value Date    WBC 5.36 02/10/2022    HGB 7.7 (L) 02/10/2022    HCT 21.7 (L) 02/10/2022    PLT 198 02/10/2022     Lab Results   Component Value Date    GLU 96 02/10/2022    BUN 7.0 (L) 02/10/2022    CREAT 0.9 02/10/2022    NA 134 (L) 02/10/2022    K 4.5 02/10/2022    CL 100 02/10/2022    CO2  27 02/10/2022    AST 37 02/06/2022    ALT 25 02/06/2022     Lab Results   Component Value Date    MG 2.0 02/10/2022    TSH 0.80 10/05/2021    HGBA1C 5.1 01/28/2022    BNP 96 01/17/2022     Lab Results   Component Value Date    CHOL 130 10/05/2021    TRIG 46 10/05/2021    HDL 49 10/05/2021    LDL 72 10/05/2021     Most recent echo and cath study reviewed.    IMPRESSION:   Erik [name] is a 46 y.o. male with the following problems:    Chest pain with both typical (elephant on chest) and atypical (reproducible on exam with palpation of chest) features, with serial hs-troponins negative despite many hours of pain (8 hs continuous prior to ED presentation)  CAD on the basis of coronary calcifications seen incidentally on CT chest 12/23 that ruled out dissection, central PE   Family history of CAD father's fatal MI at age 75  Patient remains hemodynamically and electrically stable  EKG does not meet STEMI criteria   HS trop negative  D-Dimer slightly elevated  Discharged home on aspirin, statin, Imdur and BB   Exercise SPECT 11/2021 showing inferior and inferolateral wall ischemia.  Coronary angiogram with 3VD (LAD dFR positive, OM1 70%, RCA CTO)  Recurrent hospitalizations for chest pain   TTE 10/05/21 EF 54%, normal diastolic fxn, no sig valve dz    Status post successful CABG (LIMA-LAD, SVG-PDA, L Radial OM1) and PFO closure with Dr. Austin [name] on 02/05/2022  HTN  HLD  Active smoker   Alcohol use       RECOMMENDATIONS:    CAD s/p CABG   Continue aspirin and plavix  Cardiac rehab ordered  No more chest pain and SOB  HTN  Well controlled  Continue current regimen  HLD  Repeat FLP  Continue current regimen  Active smoking  Encouraged smoking cessation     RTC July as scheduled. RTC sooner if needed. Patient still prepping for move to [place].                                                    Orders Placed This Encounter   Procedures    Lipid panel    Referral to [facility] [place]       Orders Placed This Encounter    Medications    clopidogrel (PLAVIX) 75 mg tablet     Sig: Take 1 tablet (75  mg) by mouth daily     Dispense:  90 tablet     Refill:  3       SIGNED:    Art Buff, MD          This note was generated by the Dragon speech recognition and may contain errors or omissions not intended by the user. Grammatical errors, random word insertions, deletions, pronoun errors, and incomplete sentences are occasional consequences of this technology due to software limitations. Not all errors are caught or corrected. If there are questions or concerns about the content of this note or information contained within the body of this dictation, they should be addressed directly with the author for clarification.

## 2022-02-24 ENCOUNTER — Encounter (INDEPENDENT_AMBULATORY_CARE_PROVIDER_SITE_OTHER): Payer: Self-pay | Admitting: Hospitalist

## 2022-02-24 ENCOUNTER — Encounter (INDEPENDENT_AMBULATORY_CARE_PROVIDER_SITE_OTHER): Payer: Self-pay

## 2022-02-24 ENCOUNTER — Ambulatory Visit (INDEPENDENT_AMBULATORY_CARE_PROVIDER_SITE_OTHER): Payer: Commercial Managed Care - POS | Admitting: Hospitalist

## 2022-02-24 VITALS — BP 124/70 | HR 101 | Temp 98.1°F | Resp 14 | Wt 183.0 lb

## 2022-02-24 DIAGNOSIS — I10 Essential (primary) hypertension: Secondary | ICD-10-CM | POA: Insufficient documentation

## 2022-02-24 DIAGNOSIS — I2510 Atherosclerotic heart disease of native coronary artery without angina pectoris: Secondary | ICD-10-CM

## 2022-02-24 MED ORDER — METOPROLOL SUCCINATE ER 25 MG PO TB24
50.0000 mg | ORAL_TABLET | Freq: Every day | ORAL | 3 refills | Status: AC
Start: 2022-02-24 — End: ?

## 2022-02-24 NOTE — Progress Notes (Signed)
Met with patient to provide community resources.  Provided patient with a bag of food from [employer].  Explained that CHW will provide resources for the community via email.    [employer] Patient Workflow    [employer] Resources: Geophysicist/field seismologist (Explained CHW will provide resources for food in the community via email.)  Patient Given: Food Bag    Patient voiced understanding and had no further questions at this time.      Celine Mans   [employer] Worker   [employer] for [employer]   (Previously-[facility])  P: [phone]  F: [phone]

## 2022-02-24 NOTE — Patient Instructions (Signed)
Would recommend you cut down on drinking. Alcohol can be detrimental to your heart disease and overall health. Please increase your blood pressure medication as prescribed.   Please keep a log of your blood pressure.

## 2022-02-24 NOTE — Progress Notes (Signed)
Language Spoken: English  Interpreter ID#:  n/a, fluent in patient's spoken language    History of Present Illness:   This patient is a 46 y.o. male w/ PMH of mvCAD (s/p CABG), HTN, HLD, alcohol use disorder. Recent hospitalization April 26th to May 1st for elective CABG. Presents today for follow-up. Since discharge has seen CT surgery and Cardiology.     Today presents to clinic accompanied by his mother. Denies chest pain or dyspnea upon ambulation. Is currently living in hotel room prior to moving. Reports drinking 12-14 beer cans daily and is actively smoking. Plans to move to [place] after completing cardiac rehab.     Past Medical History:   Diagnosis Date    Chest pain 12/10/2021    Coronary artery disease involving native coronary artery of native heart without angina pectoris 02/04/2022    pre op dx    COVID 09/2021    11/2021: asymptomatic    Hearing loss in Left ear     History of echocardiogram 10/05/2021    EF 54    History of nuclear stress test: exercise SPECT abn 11/22/2021    Hyperlipidemia     on Rx    Hyponatremia     TBI (traumatic brain injury) 1999     Past Surgical History:   Procedure Laterality Date    CLOSURE, PATENT FORAMEN OVALIS  02/05/2022    Procedure: CLOSURE, PATENT FORAMEN OVALE (OPEN);  Surgeon: Austin Miles, MD;  Location: [facility] HEART OR;  Service: Cardiothoracic;;    CORONARY ARTERY BYPASS N/A 02/05/2022    Procedure: CORONARY ARTERY BYPASS X 3 ; LIMA;  Surgeon: Austin Miles, MD;  Location: [facility] HEART OR;  Service: Cardiothoracic;  Laterality: N/A;    DENTAL SURGERY      ECHOCARDIOGRAM, TRANSESOPHAGEAL  02/05/2022    Procedure: ECHOCARDIOGRAM, TRANSESOPHAGEAL;  Surgeon: Barbara Cower, MD;  Location: [facility] HEART OR;  Service: Anesthesiology;;    ENDOSCOPIC, RADIAL ARTERY HARVEST Left 02/05/2022    Procedure: ENDOSCOPY, HARVEST RADIAL ARTERY;  Surgeon: Austin Miles, MD;  Location: [facility] HEART OR;  Service: Cardiothoracic;  Laterality: Left;    ENDOSCOPIC,VEIN  HARVEST Left 02/05/2022    Procedure: ENDOSCOPIC,VEIN HARVEST;  Surgeon: Austin Miles, MD;  Location: [facility] HEART OR;  Service: Cardiothoracic;  Laterality: Left;    LHC W/ CORONARY ANGIOS AND LV Left 12/12/2021    Procedure: LHC w/ Coronary Angios and LV;  Surgeon: Cilia, Early Chars, MD;  Location: [facility] CARDI[facility] CATH;  Service: Cardiovascular;  Laterality: Left;    LHC W/ CORONARY ANGIOS AND LV Left 01/08/2022    Procedure: LHC w/ Coronary Angios and LV;  Surgeon: Tawanna Sat, MD;  Location: [facility] CATH;  Service: Cardiovascular;  Laterality: Left;  dFR LAD     Family History   Problem Relation Age of Onset    Hypothyroidism Mother     Heart attack Father     Hyperlipidemia Sister     Hypertension Sister     Diabetes Brother     COPD Brother     Heart attack Maternal Grandfather      Social History     Tobacco Use    Smoking status: Every Day     Packs/day: 0.33     Years: 40.00     Pack years: 13.20     Types: Cigarettes    Smokeless tobacco: Never   Vaping Use    Vaping status: Never Used   Substance Use Topics    Alcohol use: Yes  Comment: 24-48 drinks/ week    Drug use: Never       Allergies:   No Known Allergies    Medications:     Current Outpatient Medications:     aspirin EC 81 MG EC tablet, Take 1 tablet (81 mg) by mouth daily, Disp: 30 tablet, Rfl: 0    atorvastatin (LIPITOR) 40 MG tablet, Take 1 tablet (40 mg) by mouth nightly, Disp: 90 tablet, Rfl: 3    clopidogrel (PLAVIX) 75 mg tablet, Take 1 tablet (75 mg) by mouth daily, Disp: 90 tablet, Rfl: 3    famotidine (PEPCID) 20 MG tablet, Take 1 tablet (20 mg) by mouth every 12 (twelve) hours, Disp: 30 tablet, Rfl: 0    metoprolol succinate XL (TOPROL-XL) 25 MG 24 hr tablet, Take 1 tablet (25 mg) by mouth daily (Patient taking differently: Take 1 tablet (25 mg) by mouth every morning), Disp: 90 tablet, Rfl: 3    sertraline (ZOLOFT) 50 MG tablet, Take 1 tablet (50 mg) by mouth daily, Disp: 30 tablet, Rfl: 0    Physical Exam:   There were no vitals  taken for this visit.  Wt Readings from Last 3 Encounters:   02/21/22 81.6 kg (180 lb)   02/18/22 84 kg (185 lb 3.2 oz)   02/10/22 82.9 kg (182 lb 12.8 oz)       Physical Exam    General: awake, alert, oriented x 3  Cardiovascular: S1, S2, regular rate and rhythm, no murmurs, rubs, or gallops  Lungs: clear to auscultation bilaterally without wheezing, rhonchi, or rales  Abdomen: soft, non tender  Extremities: no edema  Skin: midline surgical scar.-non erythematous.     Diagnostics:     Lab Results   Component Value Date    WBC 5.36 02/10/2022    HGB 7.7 (L) 02/10/2022    HCT 21.7 (L) 02/10/2022    PLT 198 02/10/2022    CHOL 130 10/05/2021    TRIG 46 10/05/2021    HDL 49 10/05/2021    LDL 72 10/05/2021    ALT 25 02/06/2022    AST 37 02/06/2022    NA 134 (L) 02/10/2022    K 4.5 02/10/2022    CL 100 02/10/2022    CREAT 0.9 02/10/2022    BUN 7.0 (L) 02/10/2022    CO2 27 02/10/2022    TSH 0.80 10/05/2021    INR 1.0 02/06/2022    GLU 96 02/10/2022    HGBA1C 5.1 01/28/2022       Lab Results   Component Value Date    GLUCOSEWHOLE 133 (H) 02/05/2022    GLUCOSEWHOLE 155 (H) 02/05/2022    GLUCOSEWHOLE 187 (H) 02/05/2022        XR Chest 2 Views    Result Date: 02/09/2022  No pneumothorax post left chest tube removal. Stable small bilateral pleural effusions. Carlynn Spry, MD 02/09/2022 6:25 PM    XR Chest AP Portable    Result Date: 02/09/2022  Bilateral lower lung atelectasis, no significant interval change. Collene Schlichter, MD 02/09/2022 11:39 AM    XR Chest AP Portable    Result Date: 02/08/2022   1.Unchanged mild bibasilar atelectasis and small right pleural effusion. 2. No new or worsening infiltrates are appreciated. There is no evidence of CHF. Miguel Dibble, MD 02/08/2022 7:54 AM    XR Chest AP Portable    Result Date: 02/07/2022  Mild bibasilar atelectasis left greater than right is increased with small pleural effusions. Will Bonnet, MD 02/07/2022 12:50 PM  X-ray chest AP portable (daily)    Result Date: 02/07/2022  No  pneumothorax 2. Mild interstitial edema with effusions Marty Heck, MD 02/07/2022 7:56 AM    X-ray chest AP portable (daily)    Result Date: 02/06/2022   1.Tubes and lines as above. 2.Residual left basilar atelectasis. 3.Mild vascular congestion. Campbell Stall 02/06/2022 8:33 AM    XR Chest AP Portable    Result Date: 02/05/2022  1. Increased opacity along the left superior mediastinum, partially obscuring the aortic knob. Mediastinal hemorrhage not excluded. Chest CT recommended for further evaluation. COMMENT: This urgent result was discussed with and acknowledged by Gerarda Fraction, NP at 1905 hours on 02/05/2022. Kennyth Lose, MD 02/05/2022 7:10 PM    X-ray chest AP portable (once)    Result Date: 02/05/2022  Postoperative changes with mild edema Genelle Bal 02/05/2022 1:21 PM       Active Problems:     Patient Active Problem List   Diagnosis    Chest pain    History of heart attack    Abnormal exercise myocardial perfusion study    Chronic total occlusion of coronary artery    Chest pain, unspecified type    Coronary artery disease of native artery with stable angina pectoris    Preoperative cardiovascular examination    Current smoker    Alcohol use disorder    Financial difficulties    Pre-operative cardiovascular examination    S/P CABG x 3       Assessment/Plan:   46 y.o. male w/ PMH of CAD (s/p 3V CABG and PFO closure), HTN, HLD. Presents for post-hospitalization initial follow-up.     #3V CAD (s/p CABG on April 26th, 2023)  -c/w Plavix 75 mg daily  -c/w ASA 81 mg daily  -increase Toprol-XL 25 mg daily to 50 mg daily (BP log from home SBP 130-150s' HR 90s)  -s/p post op visit w/ CT surgery and Cardiology ([facility]) outpatient.   -cardiac rehab ordered by cardiology; has f/u appointment in July w/ Cards as well.     #GERD  -c/w Pepcid 20 mg q12    #Dyslipidemia  -c/w Lipitor 40 mg QHS    Active smoker- actively cutting down.     Dispo: Can give [place] a call if anything comes up. Has plans to move to [place] in next couple  weeks after rehab.     Diagnoses and all orders for this visit:    Coronary artery disease involving native heart without angina pectoris, unspecified vessel or lesion type  -     metoprolol succinate XL (TOPROL-XL) 25 MG 24 hr tablet; Take 2 tablets (50 mg) by mouth daily    Primary hypertension  -     metoprolol succinate XL (TOPROL-XL) 25 MG 24 hr tablet; Take 2 tablets (50 mg) by mouth daily          Follow up: N/A  [facility]charged from [facility] Yes  Medical Home Referred to/Referred Back to: N/A        Attending Attestation:     I have seen and examined the patient and agree with the findings, exam, and plan as documented by the resident.     Lianne Bushy, MD, FACP

## 2022-02-24 NOTE — Progress Notes (Signed)
Met with patient and companion  to review plan of care.     Provided AVS to patient. Reviewed instructions including medications, dosages and upcoming appointments.    [employer] Patient Workflow    Long-Term Medical Home: Other (Patient will go back to [place] in 6 weeks and follow up with his PCP.)  Patient Given: Other (BP Log.)    Provided blood pressure log and instructed to take it twice daily and record values on log and bring log back to next clinical appointment.    Rx changed- Patient aware to take metoprolol succinate XL (TOPROL-XL) 25 MG 24 hr tablet: Take 2 tablets (50 mg) by mouth daily.    Patient is to follow up with [facility] while receiving his cardiac rehab with [facility].    Patient expressed understanding of instructions.    Ethelda Chick CCMA  [facility] for [facility]  [address]  [place] [address]  [phone]   [phone]

## 2022-02-27 NOTE — Research Progress Note (Signed)
RESEARCH STUDY RANDOMIZATION NOTE AND LAB NOTE  Research Study: [facility]: Terminal Warm Blood Cardioplegia and [place] in Cardiac Surgery     PI:  Jillian F. Holler MS CCP  MD:  Dr. Thedore Mins  Date:  05-Feb-2022  Subject #: [id number]     Documentation of the Informed Consent Process is written in hardcopy and stored with the study file.   A copy of the signed ICF and HIPAA Form were uploaded to medical record and on [facility]/Cardiac Surgery research share drive. The original ICF and HIPAA Form will be retained with the study file.     RANDOMIZATION:   After the consent, subject information was entered into [employer] and randomized accordingly.     Randomization assignment: no terminal cardioplegia   Randomized treatment was completed according to protocol without any adverse events.   Surgery completion:  This patient required additional repair post-infusion requiring cardioplegia; therefore, the patient is withdrawn from the study and the data will be uploaded to [employer] appropriately.     Research Labs:  No research labs will be drawn for this patient.      For research questions call study PI, Jillian F. Holler [phone] or CV surgery physicians.     Principal Investigator:  Eligha Bridegroom. Holler: [phone]   Study Coordinators:  Tonye Royalty, CRC: [phone]; [email or web address] [email or web address], RN : [phone]; [email or web address]

## 2022-03-03 ENCOUNTER — Encounter (INDEPENDENT_AMBULATORY_CARE_PROVIDER_SITE_OTHER): Payer: Self-pay | Admitting: Thoracic Surgery (Cardiothoracic Vascular Surgery)

## 2022-03-06 DIAGNOSIS — Z951 Presence of aortocoronary bypass graft: Secondary | ICD-10-CM | POA: Insufficient documentation

## 2022-03-06 NOTE — Initial Cardiac Rehab ITP (Signed)
Cardiac Rehab Individual Treatment Plan    Referring Provider: Cilia, Early Chars., MD  Comments: IA scheduled for 03/11/2022    Assessment Period  Phase: Initial Assessment  Program : Currently enrolled in Phase 2  Session #: 1  First Exercise Session (Date): 03/11/22  Referring Diagnosis: CABG (x3)  Date of Event: 02/05/22  Additional Cardiac History: Other (PFO Closure (02/05/2022))  AACVPR Risk Stratification: High risk  Ejection Fraction: 45-55%  Ejection Fraction (Test): TEE  Ejection Fraction Study Date: 02/05/2022          Exercise  Assessment: No exercise history  Work Related Physical Requirements: No  Physical Limitations: No  Current Exercise: No     Exercise Prescription/Plan  40% Heart Rate Reserve: 120  80% Heart Rate Reserve: 155  Frequency: Cardiac Rehab 3 days/week, Home 2 days/week  RPE Range: 11-14  METS: 2.0-4.0  Time: 20-29 minutes  Mode: Treadmill, Bike, Nu Step, Arm Ergometer, Walking  Strength Training: Begin when cleared by CR clinician  Education/Intervention: Attend/view education class on exercise, Provide written information on exercise topics, Educate on the cardiovascular benefits of exercise, Develop a home exercise plan, Home Exercise Log, Educate on strength training guidelines, Educate on RPE, THR, warm up/cool down, Self monitoring using pulse taking, heart rate monitor, activity tracker, Progress time and intensity when a steady state of HR and RPE occur, Educate on signs/symptoms of cardiovascular compromise  Goals: Increase in METS by at least 40%, Cardiovascular exercise 150-300 minutes per week, Strength exercise 2 -3 days per week, Avoid inactivity  Goal(s) Status: Not applicable on initial assessment     Nutrition  Assessment: Pending  Height: 188 cm (6\' 2" )  Initial Weight (lbs):  (Pending)  Weight (lbs): 180 lbs (per OV 02/21/2022)  End of Program Weight Goal: Maintain Current Wt  BMI (calculated): 23.1  Waist Circumference (inches):  (Pending)  BMI Classification: At weight  goal for BMI  Nutrition Survey: Nutrition Assessment Survey  Initial Nutrition Assessment Survey Score :  (Pending)     Nutrition Plan  Current Eating Plan: Unspecified nutrition plan  Hydration: Water, Beer (12-14 cans of beer, daily per OV 02/24/2022)  Fluid Restriction: No fluid restriction  CRP Education/Intervention: Attend/view education class on heart healthy nutrition topics, Provide written information on heart healthy nutrition  topics, Educate on the role of nutrition on BP control, Educate on the role of nutrition in lipid management, Hydration Guidelines, Educate patient on heart healthy food choices  Consult Status: Not Completed (Pt not covered for RDN nutr. consult.)  Food Log: Not applicable  Goals: BMI 18.5-24.9, Waist (men) <40 inches, (women) <35 inches, Heart healthy eating  Goal(s) Status: Not applicable on initial assessment  Comment: SDOH: Food Insecurity Present (02/24/2022)     Psychosocial  Assessment: History of depression  Barriers to Learning: None  Barriers to Attendence: None  Occupation:  (Pending)  Work Status:  (Pending)  Initial PHQ-9 Score:  (Pending)  Initial COOP Quality of Life Survey Score:  (Pending)  Education/Intervention: Repeat psychosocial survey, Attend / view risk factor management class, Provide written information on stress / lifestyle management, Educate on the importance of medication compliance on cardiovascular health, Support group, Refer back to community provider if appropriate, [employer] [facility] Referral, Teach and support self-help strategies, Note to MD if survey indicates psychosocial needs, Regular physical activity/exercise, Assist patient with identifying stressors, Educate on positive coping mechanisms  Goals: Adequate support system, Positive coping mechanisms  Goal(s) Status: Not applicable on initial assessment  Tobacco Cessation:  Assessment: Current tobacco user (in the last 30 days)  Quit Date (year):  (Pending)  Packs per day:  0.33  Number of years of tobacco use: 40  Education/Intervention: 3-10 minutes of counseling, Individual cardiac rehab coaching/education, Provide written information on tobacco cessation, Referral to Specialist / Tobacco Cessation Program, Discuss Tobacco Cessation Pharmocotherapy, Discuss positive coping mechanisms, Review past quit attempts, counseling, and medication used, Review potential triggers and coping strategies, Facilitate quit date, Tobacco cessation pharmacotherapy  Goals: Become tobacco free  Goal(s) Status: Not applicable on initial assessment     Heart Failure:  Assessment: No Heart Failure    Patient Stated Goals  What Matters Most?: Pending (03/06/2022  1:00 PM)      Patient Stated Goal 1: Pending

## 2022-03-11 ENCOUNTER — Telehealth (INDEPENDENT_AMBULATORY_CARE_PROVIDER_SITE_OTHER): Payer: Self-pay

## 2022-03-11 ENCOUNTER — Encounter (INDEPENDENT_AMBULATORY_CARE_PROVIDER_SITE_OTHER): Payer: Self-pay

## 2022-03-11 ENCOUNTER — Inpatient Hospital Stay
Admission: RE | Admit: 2022-03-11 | Discharge: 2022-03-11 | Disposition: A | Discharge status: Home / Self Care | Payer: Commercial Managed Care - POS | Source: Ambulatory Visit | Attending: Cardiovascular Disease | Admitting: Cardiovascular Disease

## 2022-03-11 DIAGNOSIS — Z951 Presence of aortocoronary bypass graft: Secondary | ICD-10-CM | POA: Insufficient documentation

## 2022-03-11 DIAGNOSIS — Z48812 Encounter for surgical aftercare following surgery on the circulatory system: Secondary | ICD-10-CM | POA: Insufficient documentation

## 2022-03-11 NOTE — Progress Notes (Signed)
[facility] Cardiac Rehabilitation  [address]  [place] [address]  Phone: [phone]  Fax: [phone]  @APPTPH @      Date: 03/11/2022   Patient: Erik [name]   DOB:: 11-14-1975   Diagnosis: 1. S/P CABG (coronary artery bypass graft)             Dr. Elder Love    Description of Event:    Your patient Erik [name] is currently enrolled in the Phase 2 Cardiac Rehabilitation Program at [facility].    We are reporting the following concerns:    Rhythm Change:   Asymptomatic, Rhythm:  T-wave abnormality, See attached report for rhythm strips. Please see CR Session Report - 1 under Chart Review - Notes in Epic.     This concern  occurred during set up on telemetry and was consistent throughout the patient's exercise session.    Intervention:  Patient assessed for symptoms with changes to rhythm. Patient denies any symptoms and vitals obtained during the exercise session were within normal ranges.     Based on the reported event, the following actions were taken:     Rhythm Change:   patient assessed for symptoms with rhythm change, notified MD through Epic.    Thank you for referring your patient to [facility]. Please review the information presented and provide any updates to care plan or follow up as needed.    [facility]	[place]

## 2022-03-11 NOTE — Telephone Encounter (Signed)
Telephone call from pat to RN line requesting refills of medications,Plavix are about to running off current medications.  Erik [name] was called to let him know that he has 3 more refills at his Pharmacy [facility] in [facility] to pick up meds.  Patient voiced understanding and had no further questions at this time.      Buck Mam, LPN  Licensed Practical Nurse  [facility] for [facility]  [facility])  [address] [place] [address], [address], [place], [place] [address] T [phone] F [phone] M-F

## 2022-03-11 NOTE — Progress Notes (Signed)
Erik [name] presented to Cardiac Rehab for initial assessment.  Patient care completed was >91 minutes.  The following was performed during the initial assessment:    Evaluation of ECG using [facility] telemetry monitoring  Physical assessment  Exercise assessment, planning, and education  Risk factor assessment, planning, and education  ITP review and update  Outcomes data collected and reviewed      Physical Assessment    Vascular:     BP  R:  118/78  L:  112/66  Standing:  108/76 (R)    Have you had any lightheadedness or dizziness?No    Apical Pulse:  regular    Do you have any sores, numbness or tingling in your lower extremities? no    Heart Sounds: S1 and S2     Peripheral Edema: none    Diabetic Foot Check:  Do you have any redness, cuts or sores on your feet? n/a    Lung Sounds: CTA Bilaterally    Have you had any increase or changes in shortness of breath? No    Muscle Skeletal:  Sternal precautions through 05/07/22    Incisions: sternum, leg left, and dry and intact    Have you had any new / other pain? No    Comments:   Patient presents to Cardiac Rehab initial assessment following CABGx3 and PFO closure on 02/05/22. Patient denies any popping, clicking, or grinding at the incision site. Patient is accompanied today by his mother who is in from [place]. Patient reports he is currently consuming alcohol and tobacco daily, refer to CR ITP section in "Flowsheets" for more information. Will be continuing to follow up on behavior/lifestyle modifications during the course of the patient's CR sessions.    Patient was able to tolerate 21 minutes of moderate intensity exercise on the treadmill and recumbent bike with a mild-moderate rating on the Rating of Perceived exertion scale. HR and BP responses were within normal ranges, however, we did notice a possible T-wave abnormality when the patient was set up on telemetry. A note will be sent to the patient's cardiologist for further review. Patient made  aware and agreeable to plan. Patient was able to discharge asymptomatic from today's exercise session.

## 2022-03-13 ENCOUNTER — Inpatient Hospital Stay
Admission: RE | Admit: 2022-03-13 | Discharge: 2022-03-13 | Disposition: A | Discharge status: Home / Self Care | Payer: Commercial Managed Care - POS | Source: Ambulatory Visit | Attending: Cardiovascular Disease | Admitting: Cardiovascular Disease

## 2022-03-13 DIAGNOSIS — Z951 Presence of aortocoronary bypass graft: Secondary | ICD-10-CM | POA: Insufficient documentation

## 2022-03-13 DIAGNOSIS — Z48812 Encounter for surgical aftercare following surgery on the circulatory system: Secondary | ICD-10-CM | POA: Insufficient documentation

## 2022-03-17 ENCOUNTER — Inpatient Hospital Stay: Admission: RE | Admit: 2022-03-17 | Payer: Commercial Managed Care - POS | Source: Ambulatory Visit

## 2022-03-18 ENCOUNTER — Telehealth (INDEPENDENT_AMBULATORY_CARE_PROVIDER_SITE_OTHER): Payer: Self-pay

## 2022-03-18 NOTE — Telephone Encounter (Signed)
Delena Bali calling from [place] regarding disability forms faxed to [facility] office.     Spoke with Marisue Ivan, RN at [facility] office, forms were not faxed to that office. Arn Medal to refax forms to the [place] office since Dr Elder Love will be in the office here tomorrow. Will send message to Dr Cilia to give heads up about in coming forms.     [place] v/u.

## 2022-03-19 ENCOUNTER — Telehealth (INDEPENDENT_AMBULATORY_CARE_PROVIDER_SITE_OTHER): Payer: Self-pay

## 2022-03-19 ENCOUNTER — Inpatient Hospital Stay
Admission: RE | Admit: 2022-03-19 | Discharge: 2022-03-19 | Disposition: A | Discharge status: Home / Self Care | Payer: Commercial Managed Care - POS | Source: Ambulatory Visit

## 2022-03-19 NOTE — Telephone Encounter (Signed)
LMTCB regarding what the pt's disability will be for his forms.

## 2022-03-19 NOTE — Telephone Encounter (Signed)
-----   Message from Art Buff, MD sent at 03/19/2022 12:34 PM EDT -----  Regarding: Paperwork?  Hello,    We were unable to track down the paperwork for [name] at [facility]. I will be in [facility] Friday so we can get it done then if that works?    Thank you!    Mardella Layman

## 2022-03-19 NOTE — Telephone Encounter (Signed)
Spoke to [employer] from [employer]. Explained that we never received any disability forms to fill out for the pt. Ethelene Browns states that it was never sent to the fax number I gave him today, and that he would fax them over now.     Claim # [id number]

## 2022-03-19 NOTE — Telephone Encounter (Signed)
Called pt and he is going to reach out to [employer] and ask them to refax to [phone] attention Christine.

## 2022-03-19 NOTE — Telephone Encounter (Signed)
Spoke with the pt regarding the disability forms from [employer]. Pt said that he was told he was going to receive these in the mail but has not received them yet. Pt states he will call his case manager back to inquire the status of the forms.    Advised pt to ask if he needs to fill anything out before we do so he does not have resend them to the [facility] office multiple times. Pt v/u.     Advised pt I will call Delena Bali again to inquire about why they did not fax over the forms for Dr Cilia to sign today.

## 2022-03-20 ENCOUNTER — Encounter (INDEPENDENT_AMBULATORY_CARE_PROVIDER_SITE_OTHER): Payer: Self-pay

## 2022-03-20 ENCOUNTER — Telehealth (INDEPENDENT_AMBULATORY_CARE_PROVIDER_SITE_OTHER): Payer: Self-pay

## 2022-03-20 ENCOUNTER — Ambulatory Visit: Payer: Commercial Managed Care - POS

## 2022-03-20 NOTE — Telephone Encounter (Addendum)
Forms were faxed to the [facility] and filled out by Dr Elder Love.  Completed forms scanned into chart and faxed back to [employer].

## 2022-03-20 NOTE — Telephone Encounter (Signed)
Called Mr.Zeiser to let him know that his disability forms for [employer] have been completed by Dr. Elder Love. His signature is required on section 1 of the forms before it can be faxed back to [employer]. Left voicemail with this information.

## 2022-03-24 ENCOUNTER — Ambulatory Visit: Payer: Commercial Managed Care - POS

## 2022-03-26 ENCOUNTER — Ambulatory Visit: Payer: Commercial Managed Care - POS

## 2022-03-27 ENCOUNTER — Ambulatory Visit: Payer: Commercial Managed Care - POS

## 2022-03-31 ENCOUNTER — Ambulatory Visit: Payer: Commercial Managed Care - POS

## 2022-04-02 ENCOUNTER — Ambulatory Visit: Payer: Commercial Managed Care - POS

## 2022-04-03 ENCOUNTER — Ambulatory Visit: Payer: Commercial Managed Care - POS

## 2022-04-03 NOTE — Addendum Note (Signed)
Encounter addended by: Caprice Red, RN on: 04/03/2022 7:44 AM   Actions taken: Flowsheet data copied forward, Flowsheet accepted, Clinical Note Signed

## 2022-04-03 NOTE — Cardiac Rehab ITP (Signed)
Cardiac Rehab Individual Treatment Plan    Referring Provider: Cilia, Early Chars, MD  Comments: Discharge after 3rd session    Assessment Period  Phase: Discharge Assessment  Program : Currently enrolled in Phase 2  Session #: 3  First Exercise Session (Date): 03/11/22  Referring Diagnosis: CABG (x3)  Date of Event: 02/05/22  Additional Cardiac History: Other (PFO Closure (02/05/2022))  AACVPR Risk Stratification: High risk  Ejection Fraction: 45-55%  Ejection Fraction (Test): TEE  Ejection Fraction Study Date: 02/05/2022  Comment: Unable to assess. Last CR session attended was 6/1 and all future sessions cancelled by patient.

## 2022-04-07 ENCOUNTER — Ambulatory Visit: Payer: Commercial Managed Care - POS

## 2022-04-09 ENCOUNTER — Ambulatory Visit: Payer: Commercial Managed Care - POS

## 2022-04-10 ENCOUNTER — Ambulatory Visit: Payer: Commercial Managed Care - POS

## 2022-04-14 ENCOUNTER — Ambulatory Visit: Payer: Commercial Managed Care - POS

## 2022-04-16 ENCOUNTER — Ambulatory Visit: Payer: Commercial Managed Care - POS

## 2022-04-17 ENCOUNTER — Ambulatory Visit: Payer: Commercial Managed Care - POS

## 2022-04-21 ENCOUNTER — Ambulatory Visit: Payer: Commercial Managed Care - POS

## 2022-04-23 ENCOUNTER — Ambulatory Visit (INDEPENDENT_AMBULATORY_CARE_PROVIDER_SITE_OTHER): Payer: Commercial Managed Care - POS | Admitting: Cardiovascular Disease

## 2022-04-23 ENCOUNTER — Ambulatory Visit: Payer: Commercial Managed Care - POS

## 2022-04-24 ENCOUNTER — Ambulatory Visit: Payer: Commercial Managed Care - POS

## 2022-04-28 ENCOUNTER — Ambulatory Visit: Payer: Commercial Managed Care - POS

## 2022-04-30 ENCOUNTER — Ambulatory Visit: Payer: Commercial Managed Care - POS

## 2022-05-01 ENCOUNTER — Ambulatory Visit: Payer: Commercial Managed Care - POS

## 2022-05-05 ENCOUNTER — Ambulatory Visit: Payer: Commercial Managed Care - POS

## 2022-05-07 ENCOUNTER — Ambulatory Visit: Payer: Commercial Managed Care - POS

## 2022-05-08 ENCOUNTER — Ambulatory Visit: Payer: Commercial Managed Care - POS

## 2022-05-12 ENCOUNTER — Ambulatory Visit: Payer: Commercial Managed Care - POS

## 2022-05-14 ENCOUNTER — Ambulatory Visit: Payer: Commercial Managed Care - POS

## 2022-05-15 ENCOUNTER — Ambulatory Visit: Payer: Commercial Managed Care - POS

## 2022-05-19 ENCOUNTER — Ambulatory Visit: Payer: Commercial Managed Care - POS

## 2022-05-21 ENCOUNTER — Ambulatory Visit: Payer: Commercial Managed Care - POS

## 2022-05-22 ENCOUNTER — Ambulatory Visit: Payer: Commercial Managed Care - POS

## 2022-05-26 ENCOUNTER — Ambulatory Visit: Payer: Commercial Managed Care - POS

## 2022-05-28 ENCOUNTER — Ambulatory Visit: Payer: Commercial Managed Care - POS

## 2022-05-29 ENCOUNTER — Ambulatory Visit: Payer: Commercial Managed Care - POS

## 2022-06-02 ENCOUNTER — Ambulatory Visit: Payer: Commercial Managed Care - POS

## 2022-06-04 ENCOUNTER — Ambulatory Visit: Payer: Commercial Managed Care - POS

## 2022-06-05 ENCOUNTER — Ambulatory Visit: Payer: Commercial Managed Care - POS

## 2022-06-09 ENCOUNTER — Ambulatory Visit: Payer: Commercial Managed Care - POS

## 2022-06-11 ENCOUNTER — Ambulatory Visit: Payer: Commercial Managed Care - POS

## 2022-06-12 ENCOUNTER — Ambulatory Visit: Payer: Commercial Managed Care - POS
